# Patient Record
Sex: Male | Born: 1935 | Race: White | Hispanic: No | Marital: Married | Smoking: Former smoker
Health system: Southern US, Community
[De-identification: ages and names within clinical notes are randomized; demographics above are authoritative.]

## PROBLEM LIST (undated history)

## (undated) DIAGNOSIS — R970 Elevated carcinoembryonic antigen [CEA]: Secondary | ICD-10-CM

## (undated) DIAGNOSIS — J45909 Unspecified asthma, uncomplicated: Secondary | ICD-10-CM

## (undated) DIAGNOSIS — N200 Calculus of kidney: Secondary | ICD-10-CM

## (undated) DIAGNOSIS — Z87442 Personal history of urinary calculi: Secondary | ICD-10-CM

## (undated) DIAGNOSIS — M1990 Unspecified osteoarthritis, unspecified site: Secondary | ICD-10-CM

## (undated) DIAGNOSIS — Z86010 Personal history of colonic polyps: Principal | ICD-10-CM

## (undated) DIAGNOSIS — K862 Cyst of pancreas: Secondary | ICD-10-CM

## (undated) DIAGNOSIS — K760 Fatty (change of) liver, not elsewhere classified: Secondary | ICD-10-CM

## (undated) DIAGNOSIS — N400 Enlarged prostate without lower urinary tract symptoms: Secondary | ICD-10-CM

## (undated) DIAGNOSIS — E785 Hyperlipidemia, unspecified: Secondary | ICD-10-CM

## (undated) DIAGNOSIS — N201 Calculus of ureter: Secondary | ICD-10-CM

## (undated) DIAGNOSIS — K219 Gastro-esophageal reflux disease without esophagitis: Secondary | ICD-10-CM

## (undated) DIAGNOSIS — E119 Type 2 diabetes mellitus without complications: Secondary | ICD-10-CM

## (undated) DIAGNOSIS — N529 Male erectile dysfunction, unspecified: Secondary | ICD-10-CM

## (undated) DIAGNOSIS — R972 Elevated prostate specific antigen [PSA]: Secondary | ICD-10-CM

## (undated) DIAGNOSIS — J189 Pneumonia, unspecified organism: Secondary | ICD-10-CM

## (undated) DIAGNOSIS — D400 Neoplasm of uncertain behavior of prostate: Secondary | ICD-10-CM

## (undated) HISTORY — DX: Type 2 diabetes mellitus without complications: E11.9

## (undated) HISTORY — DX: Personal history of colonic polyps: Z86.010

## (undated) HISTORY — PX: CHOLECYSTECTOMY: SHX55

## (undated) HISTORY — DX: Gastro-esophageal reflux disease without esophagitis: K21.9

## (undated) HISTORY — DX: Elevated carcinoembryonic antigen (CEA): R97.0

## (undated) HISTORY — DX: Neoplasm of uncertain behavior of prostate: D40.0

## (undated) HISTORY — DX: Male erectile dysfunction, unspecified: N52.9

## (undated) HISTORY — DX: Benign prostatic hyperplasia without lower urinary tract symptoms: N40.0

## (undated) HISTORY — DX: Elevated prostate specific antigen (PSA): R97.20

## (undated) HISTORY — DX: Calculus of kidney: N20.0

## (undated) HISTORY — DX: Hyperlipidemia, unspecified: E78.5

## (undated) HISTORY — DX: Personal history of urinary calculi: Z87.442

## (undated) HISTORY — DX: Fatty (change of) liver, not elsewhere classified: K76.0

---

## 1983-01-02 HISTORY — PX: PERCUTANEOUS NEPHROSTOLITHOTOMY: SHX2207

## 2011-02-02 ENCOUNTER — Other Ambulatory Visit: Payer: Self-pay | Admitting: Urology

## 2011-02-02 DIAGNOSIS — K869 Disease of pancreas, unspecified: Secondary | ICD-10-CM

## 2011-02-07 ENCOUNTER — Other Ambulatory Visit: Payer: Self-pay

## 2011-02-07 ENCOUNTER — Other Ambulatory Visit: Payer: Self-pay | Admitting: Urology

## 2011-02-07 DIAGNOSIS — Z139 Encounter for screening, unspecified: Secondary | ICD-10-CM

## 2011-02-08 ENCOUNTER — Ambulatory Visit
Admission: RE | Admit: 2011-02-08 | Discharge: 2011-02-08 | Disposition: A | Discharge status: Home / Self Care | Payer: Medicare Other | Source: Ambulatory Visit | Attending: Urology | Admitting: Urology

## 2011-02-08 DIAGNOSIS — Z139 Encounter for screening, unspecified: Secondary | ICD-10-CM

## 2011-02-10 ENCOUNTER — Ambulatory Visit
Admission: RE | Admit: 2011-02-10 | Discharge: 2011-02-10 | Disposition: A | Discharge status: Home / Self Care | Payer: Medicare Other | Source: Ambulatory Visit | Attending: Urology | Admitting: Urology

## 2011-02-10 DIAGNOSIS — K869 Disease of pancreas, unspecified: Secondary | ICD-10-CM

## 2011-02-10 MED ORDER — GADOBENATE DIMEGLUMINE 529 MG/ML IV SOLN
19.0000 mL | Freq: Once | INTRAVENOUS | Status: AC | PRN
  Administered 2011-02-10: 19 mL via INTRAVENOUS

## 2011-02-12 ENCOUNTER — Encounter: Payer: Self-pay | Admitting: Gastroenterology

## 2011-02-26 ENCOUNTER — Ambulatory Visit (INDEPENDENT_AMBULATORY_CARE_PROVIDER_SITE_OTHER): Payer: Medicare Other | Admitting: Gastroenterology

## 2011-02-26 ENCOUNTER — Other Ambulatory Visit (INDEPENDENT_AMBULATORY_CARE_PROVIDER_SITE_OTHER): Payer: Medicare Other

## 2011-02-26 ENCOUNTER — Encounter: Payer: Self-pay | Admitting: Gastroenterology

## 2011-02-26 VITALS — BP 134/78 | HR 62 | Ht 68.0 in | Wt 198.6 lb

## 2011-02-26 DIAGNOSIS — K862 Cyst of pancreas: Secondary | ICD-10-CM | POA: Insufficient documentation

## 2011-02-26 DIAGNOSIS — K863 Pseudocyst of pancreas: Secondary | ICD-10-CM

## 2011-02-26 LAB — COMPREHENSIVE METABOLIC PANEL
BUN: 18 mg/dL (ref 6–23)
CO2: 30 mEq/L (ref 19–32)
Calcium: 9.8 mg/dL (ref 8.4–10.5)
Chloride: 106 mEq/L (ref 96–112)
Creatinine, Ser: 1 mg/dL (ref 0.4–1.5)
GFR: 79.2 mL/min (ref >60.00)
Glucose, Bld: 104 mg/dL — ABNORMAL HIGH (ref 70–99)

## 2011-02-26 LAB — CBC WITH DIFFERENTIAL/PLATELET
Basophils Relative: 0.6 % (ref 0.0–3.0)
Hemoglobin: 14.8 g/dL (ref 13.0–17.0)
Lymphocytes Relative: 26.6 % (ref 12.0–46.0)
Monocytes Relative: 5.2 % (ref 3.0–12.0)
Neutro Abs: 5.1 10*3/uL (ref 1.4–7.7)
RBC: 5.06 Mil/uL (ref 4.22–5.81)
WBC: 7.8 10*3/uL (ref 4.5–10.5)

## 2011-02-26 NOTE — Progress Notes (Signed)
HPI: This is a   very pleasant 78 year old man whom I am meeting for the first time today.  Was having gross hematuria.  THis occurs periodically over many years. Had CT scan at [facility] that showed pancreatic cyst, this was followed up by MRI.  I have evaluated the MRI report and images. I do not have access to the [facility] CT scan images or report however.   He has never been told he had pancreatic problems.  Never a big etoh drinker. WEight stable.  Never jaundice.  No FH of pancreatic disease.  The pains were right sided, flank radiating to right groin.    IMPRESSION from a MRI earlier this month with IV and oral contrast :  2.6 x 1.7 cm cystic lesion in the region of the pancreatic  head/uncinate process. Possible ductal communication without main  pancreatic duct dilatation, suggesting a side-branch IPMN.  Adjacent smaller cystic lesions in the uncinate process. No  enhancement. Given the size, endoscopic ultrasound with tissue  sampling should be considered. Otherwise, follow-up MRI  with/without contrast is suggested in this 6 months.  Bilateral renal cysts. No suspicious/enhancing renal lesions.  Cholelithiasis, without associated inflammatory changes.  Hepatic cysts. Hepatic steatosis.   Review of systems: Pertinent positive and negative review of systems were noted in the above HPI section. Complete review of systems was performed and was otherwise normal.    Past Medical History  Diagnosis Date  . Personal history of urinary calculi   . Impotence of organic origin   . Neoplasm of uncertain behavior of prostate   . Elevated prostate specific antigen (PSA)   . Asthma   . Kidney stone     Past Surgical History  Procedure Date  . Cystoscopy     Current Outpatient Prescriptions  Medication Sig Dispense Refill  . hydrOXYzine (ATARAX/VISTARIL) 25 MG tablet Take 25 mg by mouth as needed.      . desoximetasone (TOPICORT) 0.25 % cream Apply 1 application topically 2  (two) times daily.      . finasteride (PROSCAR) 5 MG tablet Take 5 mg by mouth daily.      . Multiple Vitamins-Minerals (MULTIVITAMIN WITH MINERALS) tablet Take 1 tablet by mouth daily.      Marland Kitchen oxyCODONE-acetaminophen (PERCOCET) 5-325 MG per tablet Take 1 tablet by mouth every 4 (four) hours as needed.        Allergies as of 02/26/2011  . (No Known Allergies)    Family History  Problem Relation Age of Onset  . Diabetes Mother   . Diabetes Sister   . Cancer Brother     unknown type  . Stroke Father     History   Social History  . Marital Status: Married    Spouse Name: N/A    Number of Children: 1  . Years of Education: N/A   Occupational History  . auctioneer    Social History Main Topics  . Smoking status: Former Games developer  . Smokeless tobacco: Never Used  . Alcohol Use: No  . Drug Use: No  . Sexually Active: Not on file   Other Topics Concern  . Not on file   Social History Narrative  . No narrative on file       Physical Exam: BP 134/78  Pulse 62  Ht 5\' 8"  (1.727 m)  Wt 198 lb 9.6 oz (90.084 kg)  BMI 30.20 kg/m2 Constitutional: generally well-appearing Psychiatric: alert and oriented x3 Eyes: extraocular movements intact Mouth: oral pharynx moist, no  lesions Neck: supple no lymphadenopathy Cardiovascular: heart regular rate and rhythm Lungs: clear to auscultation bilaterally Abdomen: soft, nontender, nondistended, no obvious ascites, no peritoneal signs, normal bowel sounds Extremities: no lower extremity edema bilaterally Skin: no lesions on visible extremities    Assessment and plan: 78 y.o. male with  incidental, 2.6 centimeter pancreatic cyst    I explained to them that I think it is very unlikely that he has pancreatic adenocarcinoma. This cyst may be precancerous however. It may be a type of cyst that has no potential to transform into cancer. Endoscopic ultrasound with fine-needle aspiration is the best next step. We will proceed with that  in the next few weeks. He has a right ureteral stone and I think the treatment of that should proceed as well.  I don't think that he needs to wait for his urology care to be done until after the pancreatic cyst is more thoroughly evaluated due to the low likelihood of underlying pancreatic cancer given the images and his history.

## 2011-02-26 NOTE — Patient Instructions (Signed)
You will be set up for an upper endoscopic ultrasound to evaluate pancreatic cyst with PROPOFOL sedation because of previous troubles with sedation. You should continue with urology care as needed (no need to wait for the pancreatic testing given low likelihood of serious pancreatic disease such as current cancer). A copy of this information will be made available to Dr. Margarita Grizzle at [facility]. You will have labs checked today in the basement lab.  Please head down after you check out with the front desk  (CA 19-9, cbc, cmet).

## 2011-02-27 ENCOUNTER — Other Ambulatory Visit: Payer: Self-pay | Admitting: Urology

## 2011-03-02 HISTORY — PX: KIDNEY STONE SURGERY: SHX686

## 2011-03-09 ENCOUNTER — Encounter (HOSPITAL_BASED_OUTPATIENT_CLINIC_OR_DEPARTMENT_OTHER): Payer: Self-pay | Admitting: *Deleted

## 2011-03-09 NOTE — Progress Notes (Signed)
NPO AFTER MN. ARRIVES AT 0945. NEEDS HG AND EKG.

## 2011-03-14 ENCOUNTER — Other Ambulatory Visit: Payer: Self-pay

## 2011-03-14 ENCOUNTER — Encounter (HOSPITAL_BASED_OUTPATIENT_CLINIC_OR_DEPARTMENT_OTHER): Payer: Self-pay | Admitting: *Deleted

## 2011-03-14 ENCOUNTER — Encounter (HOSPITAL_COMMUNITY)
Admission: RE | Disposition: A | Discharge status: Home / Self Care | Payer: Self-pay | Source: Ambulatory Visit | Attending: Urology

## 2011-03-14 ENCOUNTER — Ambulatory Visit (HOSPITAL_BASED_OUTPATIENT_CLINIC_OR_DEPARTMENT_OTHER): Payer: Medicare Other | Admitting: Anesthesiology

## 2011-03-14 ENCOUNTER — Ambulatory Visit (HOSPITAL_BASED_OUTPATIENT_CLINIC_OR_DEPARTMENT_OTHER)
Admission: RE | Admit: 2011-03-14 | Discharge: 2011-03-15 | Disposition: A | Discharge status: Home / Self Care | Payer: Medicare Other | Source: Ambulatory Visit | Attending: Urology | Admitting: Urology

## 2011-03-14 ENCOUNTER — Encounter (HOSPITAL_BASED_OUTPATIENT_CLINIC_OR_DEPARTMENT_OTHER): Payer: Self-pay | Admitting: Anesthesiology

## 2011-03-14 ENCOUNTER — Inpatient Hospital Stay: Admission: AD | Admit: 2011-03-14 | Payer: Medicare Other | Source: Ambulatory Visit | Admitting: Urology

## 2011-03-14 DIAGNOSIS — Z79899 Other long term (current) drug therapy: Secondary | ICD-10-CM | POA: Insufficient documentation

## 2011-03-14 DIAGNOSIS — R972 Elevated prostate specific antigen [PSA]: Secondary | ICD-10-CM | POA: Insufficient documentation

## 2011-03-14 DIAGNOSIS — N419 Inflammatory disease of prostate, unspecified: Secondary | ICD-10-CM | POA: Insufficient documentation

## 2011-03-14 DIAGNOSIS — R310 Gross hematuria: Secondary | ICD-10-CM | POA: Insufficient documentation

## 2011-03-14 DIAGNOSIS — N201 Calculus of ureter: Secondary | ICD-10-CM | POA: Insufficient documentation

## 2011-03-14 DIAGNOSIS — N400 Enlarged prostate without lower urinary tract symptoms: Secondary | ICD-10-CM | POA: Insufficient documentation

## 2011-03-14 HISTORY — DX: Calculus of ureter: N20.1

## 2011-03-14 HISTORY — DX: Unspecified asthma, uncomplicated: J45.909

## 2011-03-14 HISTORY — DX: Cyst of pancreas: K86.2

## 2011-03-14 HISTORY — PX: CYSTOSCOPY WITH URETEROSCOPY: SHX5123

## 2011-03-14 SURGERY — CYSTOSCOPY WITH URETEROSCOPY
Anesthesia: General | Site: Ureter | Laterality: Right | Wound class: Clean Contaminated

## 2011-03-14 MED ORDER — BELLADONNA ALKALOIDS-OPIUM 16.2-60 MG RE SUPP: 1.0000 | Freq: Four times a day (QID) | RECTAL | Status: DC | PRN

## 2011-03-14 MED ORDER — CEFAZOLIN SODIUM 1-5 GM-% IV SOLN
1.0000 g | INTRAVENOUS | Status: AC
  Administered 2011-03-14: 2 g via INTRAVENOUS

## 2011-03-14 MED ORDER — LIDOCAINE HCL (CARDIAC) 20 MG/ML IV SOLN
INTRAVENOUS | Status: DC | PRN
  Administered 2011-03-14: 60 mg via INTRAVENOUS

## 2011-03-14 MED ORDER — ROCURONIUM BROMIDE 100 MG/10ML IV SOLN
INTRAVENOUS | Status: DC | PRN
  Administered 2011-03-14: 40 mg via INTRAVENOUS
  Administered 2011-03-14: 10 mg via INTRAVENOUS

## 2011-03-14 MED ORDER — FENTANYL CITRATE 0.05 MG/ML IJ SOLN: 25.0000 ug | INTRAMUSCULAR | Status: DC | PRN

## 2011-03-14 MED ORDER — GLYCOPYRROLATE 0.2 MG/ML IJ SOLN
INTRAMUSCULAR | Status: DC | PRN
  Administered 2011-03-14: 0.6 mg via INTRAVENOUS

## 2011-03-14 MED ORDER — NEOSTIGMINE METHYLSULFATE 1 MG/ML IJ SOLN
INTRAMUSCULAR | Status: DC | PRN
  Administered 2011-03-14: 4 mg via INTRAVENOUS

## 2011-03-14 MED ORDER — PROMETHAZINE HCL 25 MG/ML IJ SOLN: 6.2500 mg | INTRAMUSCULAR | Status: DC | PRN

## 2011-03-14 MED ORDER — PROPOFOL 10 MG/ML IV EMUL
INTRAVENOUS | Status: DC | PRN
  Administered 2011-03-14: 50 mg via INTRAVENOUS
  Administered 2011-03-14: 150 mg via INTRAVENOUS

## 2011-03-14 MED ORDER — LIDOCAINE HCL 2 % EX GEL
CUTANEOUS | Status: DC | PRN
  Administered 2011-03-14: 1

## 2011-03-14 MED ORDER — CEFAZOLIN SODIUM-DEXTROSE 2-3 GM-% IV SOLR
2.0000 g | Freq: Three times a day (TID) | INTRAVENOUS | Status: AC
  Administered 2011-03-14 – 2011-03-15 (×2): 2 g via INTRAVENOUS
  Filled 2011-03-14 (×2): qty 50

## 2011-03-14 MED ORDER — LACTATED RINGERS IV SOLN
INTRAVENOUS | Status: DC
  Administered 2011-03-14 (×3): via INTRAVENOUS

## 2011-03-14 MED ORDER — ACETAMINOPHEN 325 MG PO TABS: 650.0000 mg | ORAL_TABLET | ORAL | Status: DC | PRN

## 2011-03-14 MED ORDER — ONDANSETRON HCL 4 MG/2ML IJ SOLN
4.0000 mg | INTRAMUSCULAR | Status: DC | PRN
  Administered 2011-03-15: 4 mg via INTRAVENOUS
  Filled 2011-03-14: qty 2

## 2011-03-14 MED ORDER — OXYCODONE HCL 5 MG PO TABS: 5.0000 mg | ORAL_TABLET | ORAL | Status: DC | PRN

## 2011-03-14 MED ORDER — SODIUM CHLORIDE 0.9 % IR SOLN
Status: DC | PRN
  Administered 2011-03-14: 6000 mL

## 2011-03-14 MED ORDER — FINASTERIDE 5 MG PO TABS
5.0000 mg | ORAL_TABLET | Freq: Every day | ORAL | Status: DC
  Administered 2011-03-14 – 2011-03-15 (×2): 5 mg via ORAL
  Filled 2011-03-14 (×2): qty 1

## 2011-03-14 MED ORDER — ACETAMINOPHEN 10 MG/ML IV SOLN
INTRAVENOUS | Status: DC | PRN
  Administered 2011-03-14: 1000 mg via INTRAVENOUS

## 2011-03-14 MED ORDER — ONDANSETRON HCL 4 MG/2ML IJ SOLN
INTRAMUSCULAR | Status: DC | PRN
  Administered 2011-03-14: 4 mg via INTRAVENOUS

## 2011-03-14 MED ORDER — CALCIUM CARBONATE ANTACID 500 MG PO CHEW
400.0000 mg | CHEWABLE_TABLET | Freq: Four times a day (QID) | ORAL | Status: DC | PRN
  Administered 2011-03-14: 400 mg via ORAL
  Filled 2011-03-14 (×2): qty 2

## 2011-03-14 MED ORDER — ACETAMINOPHEN 10 MG/ML IV SOLN
1000.0000 mg | Freq: Four times a day (QID) | INTRAVENOUS | Status: DC
  Administered 2011-03-14 – 2011-03-15 (×3): 1000 mg via INTRAVENOUS
  Filled 2011-03-14 (×4): qty 100

## 2011-03-14 MED ORDER — IOHEXOL 350 MG/ML SOLN
INTRAVENOUS | Status: DC | PRN
  Administered 2011-03-14: 10 mL via INTRAVENOUS

## 2011-03-14 MED ORDER — SODIUM CHLORIDE 0.9 % IV SOLN
INTRAVENOUS | Status: DC
  Administered 2011-03-14: 17:00:00 via INTRAVENOUS

## 2011-03-14 MED ORDER — FENTANYL CITRATE 0.05 MG/ML IJ SOLN
INTRAMUSCULAR | Status: DC | PRN
  Administered 2011-03-14: 100 ug via INTRAVENOUS
  Administered 2011-03-14 (×2): 50 ug via INTRAVENOUS

## 2011-03-14 MED ORDER — BACITRACIN-NEOMYCIN-POLYMYXIN 400-5-5000 EX OINT: 1.0000 "application " | TOPICAL_OINTMENT | Freq: Three times a day (TID) | CUTANEOUS | Status: DC | PRN

## 2011-03-14 MED ORDER — SENNOSIDES-DOCUSATE SODIUM 8.6-50 MG PO TABS
1.0000 | ORAL_TABLET | Freq: Two times a day (BID) | ORAL | Status: DC
  Administered 2011-03-14 – 2011-03-15 (×2): 1 via ORAL
  Filled 2011-03-14 (×3): qty 1

## 2011-03-14 MED ORDER — DEXAMETHASONE SODIUM PHOSPHATE 4 MG/ML IJ SOLN
INTRAMUSCULAR | Status: DC | PRN
  Administered 2011-03-14: 10 mg via INTRAVENOUS

## 2011-03-14 MED ORDER — MORPHINE SULFATE 2 MG/ML IJ SOLN
2.0000 mg | INTRAMUSCULAR | Status: DC | PRN
  Administered 2011-03-15: 2 mg via INTRAVENOUS
  Filled 2011-03-14: qty 1

## 2011-03-14 SURGICAL SUPPLY — 48 items
ADAPTER CATH URET PLST 4-6FR (CATHETERS) ×2 IMPLANT
ADPR CATH URET STRL DISP 4-6FR (CATHETERS) ×1
BAG DRAIN URO-CYSTO SKYTR STRL (DRAIN) ×2 IMPLANT
BAG DRN UROCATH (DRAIN) ×1
BAG URINE LEG 500ML (DRAIN) ×4 IMPLANT
BASKET LASER NITINOL 1.9FR (BASKET) ×2 IMPLANT
BASKET STNLS GEMINI 4WIRE 3FR (BASKET) IMPLANT
BASKET ZERO TIP NITINOL 2.4FR (BASKET) IMPLANT
BRUSH URET BIOPSY 3F (UROLOGICAL SUPPLIES) IMPLANT
BSKT STON RTRVL 120 1.9FR (BASKET) ×1
BSKT STON RTRVL GEM 120X11 3FR (BASKET)
BSKT STON RTRVL ZERO TP 2.4FR (BASKET)
CANISTER SUCT LVC 12 LTR MEDI- (MISCELLANEOUS) ×2 IMPLANT
CATH CLEAR GEL 3F BACKSTOP (CATHETERS) ×2 IMPLANT
CATH HEMA 3WAY 30CC 24FR COUDE (CATHETERS) ×2 IMPLANT
CATH INTERMIT  6FR 70CM (CATHETERS) IMPLANT
CATH URET 5FR 28IN CONE TIP (BALLOONS)
CATH URET 5FR 28IN OPEN ENDED (CATHETERS) ×2 IMPLANT
CATH URET 5FR 70CM CONE TIP (BALLOONS) IMPLANT
CATH URET DUAL LUMEN 6-10FR 50 (CATHETERS) ×2 IMPLANT
CLOTH BEACON ORANGE TIMEOUT ST (SAFETY) ×2 IMPLANT
DRAPE CAMERA CLOSED 9X96 (DRAPES) ×2 IMPLANT
ELECT REM PT RETURN 9FT ADLT (ELECTROSURGICAL)
ELECTRODE REM PT RTRN 9FT ADLT (ELECTROSURGICAL) IMPLANT
GLOVE BIO SURGEON STRL SZ 6.5 (GLOVE) ×2 IMPLANT
GLOVE ECLIPSE 6.0 STRL STRAW (GLOVE) ×2 IMPLANT
GLOVE ECLIPSE 7.0 STRL STRAW (GLOVE) ×2 IMPLANT
GLOVE INDICATOR 7.5 STRL GRN (GLOVE) ×2 IMPLANT
GOWN PREVENTION PLUS LG XLONG (DISPOSABLE) ×2 IMPLANT
GUIDEWIRE 0.038 PTFE COATED (WIRE) IMPLANT
GUIDEWIRE ANG ZIPWIRE 038X150 (WIRE) IMPLANT
GUIDEWIRE STR DUAL SENSOR (WIRE) ×4 IMPLANT
IV NS IRRIG 3000ML ARTHROMATIC (IV SOLUTION) ×6 IMPLANT
KIT BALLIN UROMAX 15FX10 (LABEL) IMPLANT
KIT BALLN UROMAX 15FX4 (MISCELLANEOUS) IMPLANT
KIT BALLN UROMAX 26 75X4 (MISCELLANEOUS)
LASER FIBER DISP (UROLOGICAL SUPPLIES) ×2 IMPLANT
PACK CYSTOSCOPY (CUSTOM PROCEDURE TRAY) ×2 IMPLANT
PLUG CATH AND CAP STER (CATHETERS) ×2 IMPLANT
SET HIGH PRES BAL DIL (LABEL)
SHEATH ACCESS URETERAL 38CM (SHEATH) ×2 IMPLANT
SHEATH URET ACCESS 12FR/35CM (UROLOGICAL SUPPLIES) IMPLANT
SHEATH URET ACCESS 12FR/55CM (UROLOGICAL SUPPLIES) IMPLANT
STENT CONTOUR 6FRX24X.038 (STENTS) IMPLANT
STENT URET 6FRX24 CONTOUR (STENTS) ×2 IMPLANT
SYR 30ML LL (SYRINGE) ×2 IMPLANT
SYRINGE IRR TOOMEY STRL 70CC (SYRINGE) ×2 IMPLANT
WATER STERILE IRR 500ML POUR (IV SOLUTION) ×2 IMPLANT

## 2011-03-14 NOTE — Discharge Instructions (Signed)
DISCHARGE INSTRUCTIONS FOR KIDNEY STONES OR URETERAL STENT  MEDICATIONS:   1. DO NOT RESUME YOUR ASPIRIN, or any other medicines like ibuprofen, motrin, excedrin, advil, aleve, vitamin E, fish oil as these can all cause bleeding x 7 days.  2. Resume all your other meds from home - except do not take any other pain meds that you may have at home.  ACTIVITY 1. No strenuous activity x 1week 2. No driving while on narcotic pain medications 3. Drink plenty of water 4. Continue to walk at home - you can still get blood clots when you are at home, so keep active, but don't over do it. 5. May return to work in 3 days.  BATHING 1. You can shower. Do not take baths or submerge the catheter underwater.  CATHETER (If you are discharged with a catheter.) 1. Keep your catheter secured to your leg at all times with tape. 2. You may experience leakage of urine around your catheter- as long as the  catheter continues to drain, this is normal.  If your catheter stops draining  return to the ER, but do not let anyone other than a urologist replace your  catheter. 3. You may also have blood in your urine, even after it has been clear for  several days; you may even pass some small blood clots or other material.  This  is normal as well.  If this happens, sit down and drink plenty of water to help  make urine to flush out your bladder.  If the blood in your urine becomes worse  after doing this, contact our office or return to the ER. 4. You may use the leg bag (small bag) during the day, but use the large bag at  night.  SIGNS/SYMPTOMS TO CALL: 1. Please call us if you have a fever greater than 101.5, uncontrolled  nausea/vomiting, uncontrolled pain, dizziness, unable to urinate, chest pain, shortness of breath, leg swelling, leg pain, redness around wound, drainage from wound, or any other concerns or questions.  You can reach [place] at [phone].  FOLLOW-UP 1. You have an appointment 03/19/11 at  8:00 am for catheter removal. 2. You will be scheduled for follow up in 2 weeks for stent removal.

## 2011-03-14 NOTE — Brief Op Note (Signed)
03/14/2011  12:19 PM  PATIENT:  Francisco [name]  78 y.o. male  PRE-OPERATIVE DIAGNOSIS:  Right Distal Ureter Stone  POST-OPERATIVE DIAGNOSIS:  Right Distal Ureter Stone  PROCEDURE:  Procedure(s) (LRB): CYSTOSCOPY Right URETEROSCOPY  Laser lithotripsy Right ureter stent placement Fluoroscopy  SURGEON:  Surgeon(s) and Role:    * Milford Cage, MD - Primary  PHYSICIAN ASSISTANT:   ASSISTANTS: none   ANESTHESIA:   general  EBL:  Total I/O In: 1000 [I.V.:1000] Out: -   BLOOD ADMINISTERED:none  DRAINS: Urinary Catheter (Foley)   LOCAL MEDICATIONS USED:  LIDOCAINE  and Amount: 10 ml urethral jelly  SPECIMEN:  Source of Specimen:  right ureter  DISPOSITION OF SPECIMEN:  Sent for stone analysis at [facility] lab  COUNTS:  YES  TOURNIQUET:  * No tourniquets in log *  DICTATION: .Other Dictation: Dictation Number [id number]  PLAN OF CARE: Admit for overnight observation  PATIENT DISPOSITION:  PACU - hemodynamically stable.   Delay start of Pharmacological VTE agent (>24hrs) [name] to surgical blood loss or risk of bleeding: yes

## 2011-03-14 NOTE — Progress Notes (Signed)
Reort given to [employer] , room [phone]

## 2011-03-14 NOTE — Transfer of Care (Signed)
Immediate Anesthesia Transfer of Care Note  Patient: Francisco [name]  Procedure(s) Performed: Procedure(s) (LRB): CYSTOSCOPY WITH URETEROSCOPY (Right) HOLMIUM LASER APPLICATION (Right)  Patient Location: PACU  Anesthesia Type: General  Level of Consciousness: awake, alert  and oriented  Airway & Oxygen Therapy: Patient Spontanous Breathing and Patient connected to face mask oxygen  Post-op Assessment: Report given to PACU RN and Post -op Vital signs reviewed and stable  Post vital signs: Reviewed and stable  Complications: No apparent anesthesia complications

## 2011-03-14 NOTE — Progress Notes (Signed)
Dr Margarita Grizzle in to see pt & irrigants foley cath w 218m sterile NS w blood clots noted.  Request pt to be admit.

## 2011-03-14 NOTE — Anesthesia Procedure Notes (Signed)
Procedure Name: Intubation Date/Time: 03/14/2011 10:37 AM Performed by: Norva Pavlov Pre-anesthesia Checklist: Patient identified, Emergency Drugs available, Suction available and Patient being monitored Patient Re-evaluated:Patient Re-evaluated prior to inductionOxygen Delivery Method: Circle System Utilized Preoxygenation: Pre-oxygenation with 100% oxygen Intubation Type: IV induction Ventilation: Mask ventilation without difficulty Laryngoscope Size: Mac and 3 Grade View: Grade I Tube type: Oral Tube size: 8.0 mm Number of attempts: 1 Airway Equipment and Method: stylet and oral airway Placement Confirmation: ETT inserted through vocal cords under direct vision,  positive ETCO2 and breath sounds checked- equal and bilateral Secured at: 22 cm Tube secured with: Tape Dental Injury: Teeth and Oropharynx as per pre-operative assessment

## 2011-03-14 NOTE — Anesthesia Preprocedure Evaluation (Signed)
Anesthesia Evaluation  Patient identified by MRN, date of birth, ID band Patient awake    Reviewed: Allergy & Precautions, H&P , NPO status , Patient's Chart, lab work & pertinent test results  Airway Mallampati: II TM Distance: <3 FB Neck ROM: Full    Dental No notable dental hx.    Pulmonary neg pulmonary ROS,  breath sounds clear to auscultation  Pulmonary exam normal       Cardiovascular negative cardio ROS  Rhythm:Regular Rate:Normal     Neuro/Psych negative neurological ROS  negative psych ROS   GI/Hepatic negative GI ROS, Neg liver ROS,   Endo/Other  negative endocrine ROS  Renal/GU negative Renal ROS  negative genitourinary   Musculoskeletal negative musculoskeletal ROS (+)   Abdominal   Peds negative pediatric ROS (+)  Hematology negative hematology ROS (+)   Anesthesia Other Findings   Reproductive/Obstetrics negative OB ROS                           Anesthesia Physical Anesthesia Plan  ASA: I  Anesthesia Plan: General   Post-op Pain Management:    Induction: Intravenous  Airway Management Planned: LMA  Additional Equipment:   Intra-op Plan:   Post-operative Plan:   Informed Consent: I have reviewed the patients History and Physical, chart, labs and discussed the procedure including the risks, benefits and alternatives for the proposed anesthesia with the patient or authorized representative who has indicated his/her understanding and acceptance.   Dental advisory given  Plan Discussed with: CRNA  Anesthesia Plan Comments:         Anesthesia Quick Evaluation  

## 2011-03-14 NOTE — Anesthesia Postprocedure Evaluation (Signed)
  Anesthesia Post-op Note  Patient: Francisco [name]  Procedure(s) Performed: Procedure(s) (LRB): CYSTOSCOPY WITH URETEROSCOPY (Right) HOLMIUM LASER APPLICATION (Right)  Patient Location: PACU  Anesthesia Type: General  Level of Consciousness: awake and alert   Airway and Oxygen Therapy: Patient Spontanous Breathing  Post-op Pain: mild  Post-op Assessment: Post-op Vital signs reviewed, Patient's Cardiovascular Status Stable, Respiratory Function Stable, Patent Airway and No signs of Nausea or vomiting  Post-op Vital Signs: stable  Complications: No apparent anesthesia complications

## 2011-03-14 NOTE — Progress Notes (Signed)
GU Post-op check  Patient doing well.  Pain controlled.   Filed Vitals:   03/14/11 1525  BP: 128/78  Pulse: 61  Temp: 97.3 F (36.3 C)  Resp: 20  Gen: NAD Abd: soft GU: Foley draining pink fluid on CBI  A/P: Right ureteroscopy with laser lithotripsy and right ureter stent placement today. -Admitted for gross hematuria from the prostate following the surgery. -Continue CBI.  Will turn off in the morning and assess further CBI need at that time.

## 2011-03-14 NOTE — H&P (Signed)
Urology History and Physical Exam  CC: Right ureter stone  HPI:    This patient presents today for cystoscopy, right ureteroscopy, laser lithotripsy, and right ureter stent placement. He has a right distal 5.5 x 7.5 mm stone.  This has caused pain in the past.  The stone was visible on CT 02/01/11 and KUB 02/12/11. We have discussed the risks, benefits, alternatives, and likelihood of achieving goals. Urine culture from 02/12/11 was negative for growth.  PMH: Past Medical History  Diagnosis Date  . Personal history of urinary calculi   . Impotence of organic origin   . Neoplasm of uncertain behavior of prostate   . Elevated prostate specific antigen (PSA)   . Right ureteral stone   . Asthma with allergic rhinitis   . Pancreatic cyst FOLLOWED BY DR JACOBS    PSH: Past Surgical History  Procedure Date  . Percutaneous nephrostolithotomy 1985    Allergies: No Known Allergies  Medications: No prescriptions prior to admission     Social History: History   Social History  . Marital Status: Married    Spouse Name: N/A    Number of Children: 1  . Years of Education: N/A   Occupational History  . auctioneer    Social History Main Topics  . Smoking status: Never Smoker   . Smokeless tobacco: Never Used  . Alcohol Use: No  . Drug Use: No  . Sexually Active: Not on file   Other Topics Concern  . Not on file   Social History Narrative  . No narrative on file    Family History: Family History  Problem Relation Age of Onset  . Diabetes Mother   . Diabetes Sister   . Cancer Brother     unknown type  . Stroke Father     Review of Systems: Positive: Right flank pain Negative: Chest pain, SOB, fever.  A further 10 point review of systems was negative except what is listed in the HPI.  Physical Exam:  General: No acute distress.  Awake. Head:  Normocephalic.  Atraumatic. ENT:  EOMI.  Mucous membranes moist Neck:  Supple.  No lymphadenopathy. CV:  S1 present. S2  present. Regular rate. Pulmonary: Equal effort bilaterally.  Clear to auscultation bilaterally. Abdomen: Soft.  Non- tender to palpation. Skin:  Normal turgor.  No visible rash. Extremity: No gross deformity of bilateral upper extremities.  No gross deformity of    bilateral lower extremities. Neurologic: Alert. Appropriate mood.  Studies:  No results found for this basename: HGB:2,WBC:2,PLT:2 in the last 72 hours  No results found for this basename: NA:2,K:2,CL:2,CO2:2,BUN:2,CREATININE:2,CALCIUM:2,MAGNESIUM:2,GFRNONAA:2,GFRAA:2 in the last 72 hours   No results found for this basename: PT:2,INR:2,APTT:2 in the last 72 hours   No components found with this basename: ABG:2    Assessment:  Right distal ureter stone  Plan: -To OR for cystoscopy, right ureteroscopy, laser lithotripsy, possible right ureter stent placement.

## 2011-03-14 NOTE — Progress Notes (Signed)
Robin in adm/bed placement notified of need for pt admit.

## 2011-03-15 ENCOUNTER — Encounter (HOSPITAL_BASED_OUTPATIENT_CLINIC_OR_DEPARTMENT_OTHER): Payer: Self-pay | Admitting: Urology

## 2011-03-15 MED ORDER — HYOSCYAMINE SULFATE 0.125 MG PO TABS: 0.1250 mg | ORAL_TABLET | ORAL | Status: AC | PRN

## 2011-03-15 NOTE — Progress Notes (Signed)
Patient discharged home with wife, alert and oriented, discharge instructions given patient and wife verbalize understanding, 3/way foley cath. Clamped and instruction given on date and removal, draining pink colored urine,patient in stable condition at this time

## 2011-03-15 NOTE — Discharge Summary (Signed)
Physician Discharge Summary  Patient ID: Francisco [name] MRN: [id number] DOB/AGE: [birth year] 78 y.o.  Admit date: 03/14/2011 Discharge date: 03/15/2011  Admission Diagnoses: Gross hematuria  Discharge Diagnoses: Gross hematuria   Discharged Condition: good  Hospital Course:  Patient admitted following an uncomplicated right ureteroscopy for stone removal. He had a ureteral stent placed at this time. Due to his large prostate and inflammation of his prostate he had bleeding from his prosthetic area. A catheter was placed in the operating room and he was monitored in the recovery unit. Because of the need for continuous bladder irrigation he was admitted overnight for observation. His continuous bladder irrigation was turned off in the morning and his urine remained dark pain to a ruby red color. He was monitored for over 6 hours with his urine and his condition. He had no problems with obstruction of his catheter.  The patient felt he could be discharged home and I agreed. Prior to discharge he was taught how to flush his catheter and provided with flushing supplies by his nurse.  Consults: None  Significant Diagnostic Studies: None  Treatments: surgery: Right ureteroscopy and Continuous Bladder Irrigation.  Discharge Exam: Blood pressure 136/76, pulse 75, temperature 97.7 F (36.5 C), temperature source Oral, resp. rate 20, height 5\' 8"  (1.727 m), weight 94.4 kg (208 lb 1.8 oz), SpO2 98.00%. See PE from progress note on date of discharge.  Disposition: Final discharge disposition not confirmed  Discharge Orders    Future Orders Please Complete By Expires   Discharge patient        Medication List  As of 03/15/2011 12:36 PM   STOP taking these medications         multivitamin with minerals tablet         TAKE these medications         desoximetasone 0.25 % cream   Commonly known as: TOPICORT   Apply 1 application topically as needed.      finasteride 5 MG tablet   Commonly known as: PROSCAR   Take 5 mg by mouth daily.      hydrOXYzine 25 MG tablet   Commonly known as: ATARAX/VISTARIL   Take 25 mg by mouth as needed.      hyoscyamine 0.125 MG tablet   Commonly known as: LEVSIN, ANASPAZ   Take 1 tablet (0.125 mg total) by mouth every 4 (four) hours as needed for cramping (bladder spasms).      oxyCODONE-acetaminophen 5-325 MG per tablet   Commonly known as: PERCOCET   Take 1 tablet by mouth every 4 (four) hours as needed.           Follow-up Information    Follow up with Saunders Revel, PA on 03/19/2011. (8:00 am)    Contact information:   [address] [facility] [place] [address] [phone]          Signed: Milford Cage 03/15/2011, 12:36 PM

## 2011-03-15 NOTE — Op Note (Signed)
NAME:  EJ, PINSON NO.:  [id number]  MEDICAL RECORD NO.:  [id number]  LOCATION:  1507                         FACILITY:  [facility]  PHYSICIAN:  Natalia Leatherwood, MD    DATE OF BIRTH:  06-16-1933  DATE OF PROCEDURE:  03/14/2011 DATE OF DISCHARGE:                              OPERATIVE REPORT   SURGEON:  Natalia Leatherwood, MD.  PREOPERATIVE DIAGNOSIS:  Right distal ureteral stone.  POSTOPERATIVE DIAGNOSIS:  Right distal ureteral stone.  PROCEDURES PERFORMED: 1. Cystoscopy. 2. Right ureteroscopy. 3. Laser lithotripsy. 4. Basket stone retrieval. 5. Right ureteral stent placement. 6. Fluoroscopy with interpretation.  FINDINGS:  Enlarged prostate with inflammation and bleeding from the prostate.  Embedded stone located in the distal third of the ureter, but very close to the midportion of the ureter.  This was imbedded. Multiple Randall plaques throughout the kidney.  SPECIMEN:  Stones sent for stone analysis at [facility].  ESTIMATED BLOOD LOSS:  10 cc.  COMPLICATIONS:  None.  HISTORY OF PRESENT ILLNESS:  This is a 78 year old gentleman who presented with a right distal ureteral stone that was symptomatic.  He was unable to pass the stone, and after review of treatment options he has elected to have ureteroscopy.  He presents today for that procedure.  PROCEDURE IN DETAIL:  After informed consent was obtained, the patient was taken to the operating room, where he placed in supine position.  IV antibiotics were infused and general anesthesia was induced.  He was then placed in dorsal lithotomy position making sure to pad all pertinent neurovascular pressure points appropriately.  Following this, his genitals were prepped and draped in usual sterile fashion.  A time-out was performed, in which the correct patient, surgical site, and procedure were all identified and agreed upon by the team.  Following this, a rigid cystoscope was advanced through  the urethra and into the bladder.  It was noted that he had a very large prostate with a large median lobe and a large amount of prostatic inflammation that began to bleed with passage of the cystoscope.  Due to the anatomy and the bleeding from the prostate, it was difficult to identify the right ureteral orifice.  Eventually this was identified and cannulated with a sensor tip wire, which was placed up the ureter with ease up into the right renal pelvis with a good curl in the right renal pelvis on fluoroscopy.  Next, an attempt to place a second sensor tip wire was unsuccessful because of the angle and the anatomy and therefore a dual- lumen access sheath was placed over the wire and into the distal ureter and the second sensor tip wire was placed up under fluoroscopy with good curl noted in the renal pelvis on the right side.  Following this, the dual-lumen access sheath was removed and an ureteral access sheath was placed with ease.  This was a 12-14 ureteral access sheath.  This was placed with ease into the distal ureter and obturator, and working wire were removed leaving sensor tip wire in place, which was secured to the drape.  Following this, a digital flexible ureteroscope was placed through the sheath and taken all the way  up into the kidney to ensure there were no proximal ureteral stones.  There were multiple Randall plaques noted within the kidney, but no stones loose or free within the kidney.  It appeared that there were some about to birth; however, these were still stuck to the renal papillae.  Next, the scope was retracted back into the ureter where the stone was embedded just proximal to the pelvic brim.  BackStop gel was placed in the more proximal ureter and then a 200 micron holmium laser filament was placed through the ureteroscope and lithotripsy was carried out at 0.5 joules and 10 Hz.  After this had been broken up adequately, the stone fragments were removed  with an Escape nitinol basket.  All the fragments were removed, and these fragments were sent to the [facility] for stone analysis.  After this was done, the remainder of the ureter was visualized and found to have no lacerations or injury. The access sheath was removed leaving the safety wire placed.  This wire was backloaded onto the cystoscope, and then a 6 x 24 double-J ureteral stent without strings in place was placed up the wire under fluoroscopy and was deployed into the right renal pelvis with good curl noted on the right renal pelvis and a curl noted in the bladder under direct visualization.  Because the patient did have inflamed prostate and bleeding from the prostate it was felt that he needed to have a catheter placed.  A 24-French 3-way catheter was placed with 30 cc in the balloon and irrigated.  Continuous bladder irrigation was started under anesthetic.  Lidocaine jelly of 10 cc was placed in the urethra before placing this catheter.  As mentioned 30 cc of sterile water were placed in the balloon and catheter was put on slight catheter traction. Following this, the B and O suppository was placed into his rectum. Continuous bladder irrigation was turned off in the PACU, and he will be assessed at that time whether not to be admitted for hematuria and bladder irrigation.          ______________________________ Natalia Leatherwood, MD     DW/MEDQ  D:  03/14/2011  T:  03/15/2011  Job:  [id number]

## 2011-03-15 NOTE — Progress Notes (Signed)
Urology Progress Note  Subjective:     No acute urologic events overnight. CBI turned off about one hour before I evaluated the patient.  Urine is dark pink and flowing through catheter.  Patient has no pain.  Objective:  Patient Vitals for the past 24 hrs:  BP Temp Temp src Pulse Resp SpO2 Height Weight  03/15/11 0531 134/70 mmHg 97.4 F (36.3 C) Oral 69  18  98 % - -  03/14/11 2114 120/71 mmHg 97.7 F (36.5 C) Oral 68  18  98 % - -  03/14/11 1525 128/78 mmHg 97.3 F (36.3 C) Oral 61  20  100 % 5\' 8"  (1.727 m) 94.4 kg (208 lb 1.8 oz)  03/14/11 1500 126/76 mmHg - - - 16  97 % - -  03/14/11 1400 141/99 mmHg - - 66  14  96 % - -  03/14/11 1345 136/74 mmHg - - 63  14  95 % - -  03/14/11 1330 171/105 mmHg - - 67  16  97 % - -  03/14/11 1315 142/95 mmHg - - 56  12  100 % - -  03/14/11 1300 128/81 mmHg - - 53  11  100 % - -  03/14/11 1245 150/80 mmHg - - 60  15  100 % - -  03/14/11 1220 156/78 mmHg 97.5 F (36.4 C) - 72  12  100 % - -  03/14/11 0951 134/86 mmHg 97 F (36.1 C) Oral 65  18  98 % - -    Physical Exam: General:  No acute distress, awake Cardiovascular:    [x]   S1/S2 present, RRR  []   Irregularly irregular Chest:  CTA-B Abdomen:               []  Soft, appropriately TTP  [x]  Soft, NTTP  []  Soft, appropriately TTP, incision(s) clean/dry/intact  Genitourinary: Foley in place Foley:  Draining dark pink urine    I/O last 3 completed shifts: In: 2680 [P.O.:480; I.V.:1800; IV Piggyback:400] Out: [address] [Urine:13050]  Recent Labs  [facility] 03/14/11 0945   HGB 15.6   WBC --   PLT --    No results found for this basename: NA:2,K:2,CL:2,CO2:2,BUN:2,CREATININE:2,CALCIUM:2,MAGNESIUM:2,GFRNONAA:2,GFRAA:2 in the last 72 hours   No results found for this basename: PT:2,INR:2,APTT:2 in the last 72 hours   No components found with this basename: ABG:2     Assessment: Gross hematuria Plan: -CBI turned off this morning and hand irrigation returned one small  clot. -Continue with CBI turned off this morning and I will re-evaluate at noon for possible d/c home. -Saline lock IV   Natalia Leatherwood, MD [phone]

## 2011-04-11 ENCOUNTER — Encounter (HOSPITAL_COMMUNITY): Payer: Self-pay

## 2011-04-12 ENCOUNTER — Ambulatory Visit (HOSPITAL_COMMUNITY)
Admission: RE | Admit: 2011-04-12 | Discharge: 2011-04-12 | Disposition: A | Discharge status: Home / Self Care | Payer: Medicare Other | Source: Ambulatory Visit | Attending: Gastroenterology | Admitting: Gastroenterology

## 2011-04-12 ENCOUNTER — Encounter (HOSPITAL_COMMUNITY): Payer: Self-pay | Admitting: *Deleted

## 2011-04-12 ENCOUNTER — Encounter (HOSPITAL_COMMUNITY): Payer: Self-pay | Admitting: Anesthesiology

## 2011-04-12 ENCOUNTER — Ambulatory Visit (HOSPITAL_COMMUNITY): Payer: Medicare Other | Admitting: Anesthesiology

## 2011-04-12 ENCOUNTER — Encounter (HOSPITAL_COMMUNITY)
Admission: RE | Disposition: A | Discharge status: Home / Self Care | Payer: Self-pay | Source: Ambulatory Visit | Attending: Gastroenterology

## 2011-04-12 DIAGNOSIS — K862 Cyst of pancreas: Secondary | ICD-10-CM | POA: Insufficient documentation

## 2011-04-12 DIAGNOSIS — K863 Pseudocyst of pancreas: Secondary | ICD-10-CM

## 2011-04-12 HISTORY — PX: EUS: SHX5427

## 2011-04-12 SURGERY — UPPER ENDOSCOPIC ULTRASOUND (EUS) LINEAR
Anesthesia: Monitor Anesthesia Care

## 2011-04-12 MED ORDER — CIPROFLOXACIN HCL 500 MG PO TABS: 500.0000 mg | ORAL_TABLET | Freq: Two times a day (BID) | ORAL | Status: AC

## 2011-04-12 MED ORDER — LACTATED RINGERS IV SOLN
INTRAVENOUS | Status: DC
  Administered 2011-04-12: 1000 mL via INTRAVENOUS

## 2011-04-12 MED ORDER — BUTAMBEN-TETRACAINE-BENZOCAINE 2-2-14 % EX AERO
INHALATION_SPRAY | CUTANEOUS | Status: DC | PRN
  Administered 2011-04-12: 2 via TOPICAL

## 2011-04-12 MED ORDER — PROPOFOL 10 MG/ML IV EMUL
INTRAVENOUS | Status: DC | PRN
  Administered 2011-04-12: 160 ug/kg/min via INTRAVENOUS

## 2011-04-12 MED ORDER — FENTANYL CITRATE 0.05 MG/ML IJ SOLN
INTRAMUSCULAR | Status: DC | PRN
  Administered 2011-04-12 (×2): 50 ug via INTRAVENOUS

## 2011-04-12 MED ORDER — MIDAZOLAM HCL 5 MG/5ML IJ SOLN
INTRAMUSCULAR | Status: DC | PRN
  Administered 2011-04-12 (×2): 1 mg via INTRAVENOUS

## 2011-04-12 MED ORDER — CIPROFLOXACIN IN D5W 400 MG/200ML IV SOLN
400.0000 mg | Freq: Once | INTRAVENOUS | Status: AC
  Administered 2011-04-12: 400 mg via INTRAVENOUS
  Filled 2011-04-12: qty 200

## 2011-04-12 NOTE — Op Note (Signed)
[facility] [address] [place], [place]  [address]  ENDOSCOPIC ULTRASOUND PROCEDURE REPORT  PATIENT:  Francisco [name], Francisco [name]  MR#:  [id number] BIRTHDATE:  27-Jun-1933  GENDER:  male ENDOSCOPIST:  Rachael Fee, MD PROCEDURE DATE:  04/12/2011 PROCEDURE:  Upper EUS w/FNA ASA CLASS:  Class III INDICATIONS:  incidentally noted cystic lesion in head of pancreas; no weight loss, no FH of pancreatic disease MEDICATIONS:   MAC sedation, administered by CRNA, cipro IV 400mg   DESCRIPTION OF PROCEDURE:   After the risks benefits and alternatives of the procedure were  explained, informed consent was obtained. The patient was then placed in the left, lateral, decubitus postion and IV sedation was administered. Throughout the procedure, the patient's blood pressure, pulse and oxygen saturations were monitored continuously.  Under direct visualization, the Pentax Radial EUS [id number] endoscope was introduced through the mouth and advanced to the second portion of the duodenum.  Water was used as necessary to provide an acoustic interface.  Upon completion of the imaging, water was removed and the patient was sent to the recovery room in satisfactory condition. <<PROCEDUREIMAGES>> Endoscopic findings (with radial and linear echoendoscopes): 1. Normal UGI tract EUS findings: 1. Mixid cysic/solid mass in head of pancreas. This is predominantly cystic but there is 1cm by 1cm solid, papillary like mass within the cyst. Total dimensions 2cm by 3cm. The mass abuts portal vein but there is a clear plane of normal tissue between the mass and vessel.  The lesion does not cause pancreatic duct or biliary dilation. I cannot tell if the lesion communicates with the main pancreatic duct but suspect that it does given it's size, location.  The cyst was incompletely aspirated with a single pass with a 19gauge BS EUS FNA needle.  FNA yielded 2-3cc of thick, clear fluid containing several flecks of  tissue. 2. Otherwise pancreatic parenchyma was normal; no other masses or signs of chronic pancreatitis 3. CBD was nromal, non-dilaed 4. No peripancreatic adenopathy 5. Limited views of liver, spleen, portal and splenic vessels were all normal Impression: Incidentally noted 2cm by 3cm mixed solid/cystic lesion in head of pancreas.  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 Fluid was sent for cytology, CEA and amylase.  He will complete 3 days of twice daily cipro.  ______________________________ Rachael Fee, MD  cc: Monica Becton, MD  n. eSIGNED:   Rachael Fee at 04/12/2011 11:15 AM  Jarold Motto, [phone]

## 2011-04-12 NOTE — Anesthesia Postprocedure Evaluation (Signed)
  Anesthesia Post-op Note  Patient: Francisco [name]  Procedure(s) Performed: Procedure(s) (LRB): UPPER ENDOSCOPIC ULTRASOUND (EUS) LINEAR (N/A)  Patient Location: PACU  Anesthesia Type: MAC  Level of Consciousness: oriented and sedated  Airway and Oxygen Therapy: Patient Spontanous Breathing  Post-op Pain: mild  Post-op Assessment: Post-op Vital signs reviewed, Patient's Cardiovascular Status Stable, Respiratory Function Stable and Patent Airway  Post-op Vital Signs: stable  Complications: No apparent anesthesia complications

## 2011-04-12 NOTE — Transfer of Care (Signed)
Immediate Anesthesia Transfer of Care Note  Patient: Francisco [name]  Procedure(s) Performed: Procedure(s) (LRB): UPPER ENDOSCOPIC ULTRASOUND (EUS) LINEAR (N/A)  Patient Location: PACU  Anesthesia Type: MAC  Level of Consciousness: sedated and patient cooperative  Airway & Oxygen Therapy: Patient Spontanous Breathing and Patient connected to nasal cannula oxygen  Post-op Assessment: Report given to PACU RN and Post -op Vital signs reviewed and stable  Post vital signs: Reviewed and stable  Complications: No apparent anesthesia complications

## 2011-04-12 NOTE — H&P (Signed)
  HPI: This is a man with incidentally noted pancreatic cyst    Past Medical History  Diagnosis Date  . Personal history of urinary calculi   . Neoplasm of uncertain behavior of prostate   . Elevated prostate specific antigen (PSA)   . Right ureteral stone   . Asthma with allergic rhinitis   . Pancreatic cyst FOLLOWED BY DR Tobie Hellen  . Impotence of organic origin     Past Surgical History  Procedure Date  . Percutaneous nephrostolithotomy 1985  . Cystoscopy with ureteroscopy 03/14/2011    Procedure: CYSTOSCOPY WITH URETEROSCOPY;  Surgeon: Milford Cage, MD;  Location: [facility];  Service: Urology;  Laterality: Right;  2 hours requested for this case  Right Ureter Stent Placement  C-ARM CAMERA DIGITAL URETEROSCOPE    . Kidney stone surgery March 2013    Current Facility-Administered Medications  Medication Dose Route Frequency Provider Last Rate Last Dose  . lactated ringers infusion   Intravenous Continuous Rachael Fee, MD 100 mL/hr at 04/12/11 0904 1,000 mL at 04/12/11 0904    Allergies as of 02/26/2011  . (No Known Allergies)    Family History  Problem Relation Age of Onset  . Diabetes Mother   . Diabetes Sister   . Cancer Brother     unknown type  . Stroke Father     History   Social History  . Marital Status: Married    Spouse Name: N/A    Number of Children: 1  . Years of Education: N/A   Occupational History  . auctioneer    Social History Main Topics  . Smoking status: Former Smoker -- 20 years    Types: Cigarettes    Quit date: 04/11/1991  . Smokeless tobacco: Never Used  . Alcohol Use: 0.6 oz/week    1 Cans of beer per week  . Drug Use: No  . Sexually Active: Not on file   Other Topics Concern  . Not on file   Social History Narrative  . No narrative on file      Physical Exam: BP 133/79  Temp(Src) 97.9 F (36.6 C) (Oral)  Resp 15  Ht 5\' 8"  (1.727 m)  Wt 180 lb (81.647 kg)  BMI 27.37 kg/m2  SpO2  98% Constitutional: generally well-appearing Psychiatric: alert and oriented x3 Abdomen: soft, nontender, nondistended, no obvious ascites, no peritoneal signs, normal bowel sounds     Assessment and plan: 79 y.o. male with incidentally noted pancreatic cyst  For upper EUS today

## 2011-04-12 NOTE — Anesthesia Preprocedure Evaluation (Signed)
Anesthesia Evaluation  Patient identified by MRN, date of birth, ID band Patient awake    Reviewed: Allergy & Precautions, H&P , NPO status , Patient's Chart, lab work & pertinent test results, reviewed documented beta blocker date and time   Airway Mallampati: II TM Distance: >3 FB Neck ROM: Full    Dental  (+) Partial Lower   Pulmonary neg pulmonary ROS,  breath sounds clear to auscultation        Cardiovascular negative cardio ROS  Rhythm:Regular Rate:Normal  Denies cardiac symptoms   Neuro/Psych negative neurological ROS  negative psych ROS   GI/Hepatic Neg liver ROS, Pancreatic cyst   Endo/Other  negative endocrine ROS  Renal/GU negative Renal ROS  negative genitourinary   Musculoskeletal negative musculoskeletal ROS (+)   Abdominal   Peds negative pediatric ROS (+)  Hematology negative hematology ROS (+)   Anesthesia Other Findings   Reproductive/Obstetrics negative OB ROS                           Anesthesia Physical Anesthesia Plan  ASA: II  Anesthesia Plan: MAC   Post-op Pain Management:    Induction: Intravenous  Airway Management Planned: Mask  Additional Equipment:   Intra-op Plan:   Post-operative Plan:   Informed Consent: I have reviewed the patients History and Physical, chart, labs and discussed the procedure including the risks, benefits and alternatives for the proposed anesthesia with the patient or authorized representative who has indicated his/her understanding and acceptance.   Dental advisory given  Plan Discussed with: CRNA and Surgeon  Anesthesia Plan Comments:         Anesthesia Quick Evaluation

## 2011-04-12 NOTE — Discharge Instructions (Signed)

## 2011-04-13 ENCOUNTER — Encounter (HOSPITAL_COMMUNITY): Payer: Self-pay | Admitting: Gastroenterology

## 2011-04-17 ENCOUNTER — Other Ambulatory Visit: Payer: Self-pay

## 2011-04-17 ENCOUNTER — Telehealth: Payer: Self-pay

## 2011-04-17 DIAGNOSIS — K8689 Other specified diseases of pancreas: Secondary | ICD-10-CM

## 2011-04-17 NOTE — Telephone Encounter (Signed)
Francisco [name] to notify pt  

## 2011-04-17 NOTE — Telephone Encounter (Signed)
Message copied by Donata Duff on Tue Apr 17, 2011  8:36 AM ------      Message from: Marnette Burgess      Created: Tue Apr 17, 2011  8:33 AM      Regarding: Referral       Patient is scheduled to see Dr. Almond Lint on 05/14/11 @ 9:00am, arrive @ 8:30am.  If you have any questions please call [phone].            Thank You,      Elane Fritz      ----- Message -----         From: Donata Duff, CMA         Sent: 04/17/2011   8:07 AM           To: Marnette Burgess            Dr. Donell Beers at [facility] to consider resection

## 2011-04-24 ENCOUNTER — Other Ambulatory Visit (INDEPENDENT_AMBULATORY_CARE_PROVIDER_SITE_OTHER): Payer: Self-pay | Admitting: General Surgery

## 2011-04-24 DIAGNOSIS — K862 Cyst of pancreas: Secondary | ICD-10-CM

## 2011-05-14 ENCOUNTER — Encounter (INDEPENDENT_AMBULATORY_CARE_PROVIDER_SITE_OTHER): Payer: Self-pay | Admitting: General Surgery

## 2011-05-14 ENCOUNTER — Ambulatory Visit (INDEPENDENT_AMBULATORY_CARE_PROVIDER_SITE_OTHER): Payer: Medicare Other | Admitting: General Surgery

## 2011-05-14 VITALS — BP 152/100 | HR 72 | Temp 97.4°F | Resp 14 | Ht 68.5 in | Wt 197.0 lb

## 2011-05-14 DIAGNOSIS — D490 Neoplasm of unspecified behavior of digestive system: Secondary | ICD-10-CM | POA: Insufficient documentation

## 2011-05-14 NOTE — Assessment & Plan Note (Signed)
Mr. Fulco has what appears to be a main duct IPMN based on endoscopic ultrasound.  This has a solid component of approximately a centimeter. 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 He has a very busy summer schedule. We discussed the Whipple procedure, including the rationale for the procedure.    I discussed the surgery with the patient including diagrams of anatomy.  I discussed the potential for diagnostic laparoscopy.  In the case of pancreatic cancer, if spread of the disease is found, we will abort the procedure and not proceed with resection.  The rationale for this was discussed with the patient.  There has not been data to support resection of Stage IV disease in terms of survival benefit.    We discussed possible complications including: Potential of aborting procedure if tumor is invading the superior mesenteric or hepatic arteries Bleeding Infection and possible wound complications such as hernia Damage to adjacent structures Leak of anastamoses, primarily pancreatic Possible need for other procedures Possible prolonged hospital stay Possible development of diabetes or worsening of current diabetes.  Possible pancreatic exocrine insufficiency Prolonged fatigue/weakness/appetite Difficulty with eating or post operative nausea Possible early recurrence of cancer   The patient understands and wishes to proceed. I will see him back in September.  The patient has been advised to turn in disability paperwork to our office.

## 2011-05-14 NOTE — Progress Notes (Signed)
Chief Complaint  Patient presents with  . Mass    Pancreatic    HISTORY: Patient is a 78 year old male who was found to have a pancreatic cyst on CT when he was evaluated for a renal stone. He subsequently underwent MRI. Based on the MRI report, he underwent endoscopic ultrasound for tissue sampling. On endoscopic ultrasound, he had a solid component of the mass and it appeared to be communicating with the main duct. Cytology was negative for malignant cells.  He is asymptomatic. He denies nausea, vomiting, early satiety, abdominal pain or back pain, no difficulty with his blood sugars that he is aware of. He has no family history of pancreatic cancer. He is otherwise well.  Past Medical History  Diagnosis Date  . Personal history of urinary calculi   . Neoplasm of uncertain behavior of prostate   . Elevated prostate specific antigen (PSA)   . Right ureteral stone   . Asthma with allergic rhinitis   . Pancreatic cyst FOLLOWED BY DR JACOBS  . Impotence of organic origin     Past Surgical History  Procedure Date  . Percutaneous nephrostolithotomy 1985  . Cystoscopy with ureteroscopy 03/14/2011    Procedure: CYSTOSCOPY WITH URETEROSCOPY;  Surgeon: Milford Cage, MD;  Location: [facility];  Service: Urology;  Laterality: Right;  2 hours requested for this case  Right Ureter Stent Placement  C-ARM CAMERA DIGITAL URETEROSCOPE    . Kidney stone surgery March 2013  . Eus 04/12/2011    Procedure: UPPER ENDOSCOPIC ULTRASOUND (EUS) LINEAR;  Surgeon: Rachael Fee, MD;  Location: [facility];  Service: Endoscopy;  Laterality: N/A;  radial linear/pt moved up a hour early by AW , Patty @ [facility] office ok'd & pt was called by AW    Current Outpatient Prescriptions  Medication Sig Dispense Refill  . desonide (DESOWEN) 0.05 % cream Apply topically 2 (two) times daily.      . Aspirin-Salicylamide-Caffeine (BC HEADACHE POWDER PO) Take by mouth as needed.      .  desoximetasone (TOPICORT) 0.25 % cream Apply 1 application topically as needed.       . finasteride (PROSCAR) 5 MG tablet Take 5 mg by mouth daily.      . hydrOXYzine (ATARAX/VISTARIL) 25 MG tablet Take 25 mg by mouth as needed.      Marland Kitchen oxyCODONE-acetaminophen (PERCOCET) 5-325 MG per tablet Take 1 tablet by mouth every 4 (four) hours as needed.         No Known Allergies   Family History  Problem Relation Age of Onset  . Diabetes Mother   . Diabetes Sister   . Cancer Brother     unknown type  . Stroke Father      History   Social History  . Marital Status: Married    Spouse Name: N/A    Number of Children: 1  . Years of Education: N/A   Occupational History  . auctioneer    Social History Main Topics  . Smoking status: Former Smoker -- 20 years    Types: Cigarettes    Quit date: 04/11/1991  . Smokeless tobacco: Never Used  . Alcohol Use: 0.6 oz/week    1 Cans of beer per week  . Drug Use: No  . Sexually Active: None   Other Topics Concern  . None   Social History Narrative  . None     REVIEW OF SYSTEMS - PERTINENT POSITIVES ONLY: 12 point review of systems negative other than HPI  and PMH.    EXAM: Filed Vitals:   05/14/11 0838  BP: 152/100  Pulse: 72  Temp: 97.4 F (36.3 C)  Resp: 14    Gen:  No acute distress.  Well nourished and well groomed.   Neurological: Alert and oriented to person, place, and time. Coordination normal.  Head: Normocephalic and atraumatic.  Eyes: Conjunctivae are normal. Pupils are equal, round, and reactive to light. No scleral icterus.  Neck: Normal range of motion. Neck supple. No tracheal deviation or thyromegaly present.  Cardiovascular: Normal rate, regular rhythm, normal heart sounds and intact distal pulses.  Exam reveals no gallop and no friction rub.  No murmur heard. Respiratory: Effort normal.  No respiratory distress. No chest wall tenderness. Breath sounds normal.  No wheezes, rales or rhonchi.  GI: Soft. Bowel  sounds are normal. The abdomen is soft and nontender.  There is no rebound and no guarding.  Musculoskeletal: Normal range of motion. Extremities are nontender.  Lymphadenopathy: No cervical, preauricular, postauricular or axillary adenopathy is present Skin: Skin is warm and dry. No rash noted. No diaphoresis. No erythema. No pallor. No clubbing, cyanosis, or edema.   Psychiatric: Normal mood and affect. Behavior is normal. Judgment and thought content normal.    LABORATORY RESULTS: Cyst fluid CEA 1,570 ng/mL, amylase 31,240 u/L; cytology negative. EUS images shows this is mixed solid/cystic lesion (predominantly cystic) 2cm by 3cm in head of panc.    RADIOLOGY RESULTS: MRI abd/pelvis IMPRESSION:  2.6 x 1.7 cm cystic lesion in the region of the pancreatic  head/uncinate process. Possible ductal communication without main  pancreatic duct dilatation, suggesting a side-branch IPMN.  Adjacent smaller cystic lesions in the uncinate process. No  enhancement. Given the size, endoscopic ultrasound with tissue  sampling should be considered. Otherwise, follow-up MRI  with/without contrast is suggested in this 6 months.  Bilateral renal cysts. No suspicious/enhancing renal lesions.  Cholelithiasis, without associated inflammatory changes.  Hepatic cysts. Hepatic steatosis.   ASSESSMENT AND PLAN: IPMN (intraductal papillary mucinous neoplasm), probable main duct, solid component.   Mr. Littler has what appears to be a main duct IPMN based on endoscopic ultrasound.  This has a solid component of approximately a centimeter. 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 He has a very busy summer schedule. We discussed the Whipple procedure, including the rationale for the procedure.    I discussed the surgery with the patient including diagrams of anatomy.  I discussed the potential for diagnostic laparoscopy.  In the case of pancreatic cancer, if spread of the disease  is found, we will abort the procedure and not proceed with resection.  The rationale for this was discussed with the patient.  There has not been data to support resection of Stage IV disease in terms of survival benefit.    We discussed possible complications including: Potential of aborting procedure if tumor is invading the superior mesenteric or hepatic arteries Bleeding Infection and possible wound complications such as hernia Damage to adjacent structures Leak of anastamoses, primarily pancreatic Possible need for other procedures Possible prolonged hospital stay Possible development of diabetes or worsening of current diabetes.  Possible pancreatic exocrine insufficiency Prolonged fatigue/weakness/appetite Difficulty with eating or post operative nausea Possible early recurrence of cancer   The patient understands and wishes to proceed. I will see him back in September.  The patient has been advised to turn in disability paperwork to our office.  Maudry Diego MD Surgical Oncology, General and Endocrine Surgery [facility]      Visit Diagnoses: 1. IPMN (intraductal papillary mucinous neoplasm), probable main duct, solid component.       Primary Care Physician: Minda Meo, MD, MD

## 2011-05-14 NOTE — Patient Instructions (Signed)
WHIPPLE (PANCREATICODUODENECTOMY) THE OPERATION: The goal of the operation is to remove masses in the head of the pancreas, the distal bile duct, or the duodenum.  The structures that are removed include the head of the pancreas, the duodenum, the gallbladder, and a small portion of your stomach.  The jejunum, or middle part of the small intestine, is then used to reconnect the bile duct, the pancreas, and the stomach to the intestinal tract.  The reason so much needs to be removed is that the blood supply and lymphatic channels and nodes are closely interconnected.  The operation takes between 4 and 8 hours depending on the amount of scar tissue you have present from prior surgeries, or from the mass.    WHAT HAPPENS IN THE OPERATING ROOM: I will start the operation with a diagnostic laparoscopy.  This means we will make small incisions to see if there is visible cancer outside of the pancreas.  If there is visible spread of cancer, I will stop the operation because research has shown that survival for pancreatic cancer is worse if you undergo a big operation without being able to remove all the cancer.  If no other cancer is seen, I will make a bigger incision on your abdomen and begin mobilizing the stomach, duodenum and gallbladder to see if the tumor is able to be removed.  Sometimes the tumor is too adherent to the arteries and veins supplying the liver and small intestine that it is unsafe or not possible to remove the tumor.  If this is the case, the procedure would be stopped and the tumor would be left in place.  Whatever the operative findings, I will discuss the case with your family after we are done in the operating room.  I will talk to you in the next few days when you are more awake.  I will see you in the hospital every weekday that I am not out of town.  My partners help see patients on the weekends and if I am out of town.    WHAT HAPPENS AFTER SURGERY: After surgery, you will go  first to the recovery room, then to the ICU for careful monitoring.  It is important that I am able to know your vital signs and urine output to see how you are doing.  Depending on many factors, you may be in the ICU overnight, or for several days.  You will have many tubes and lines in place which is standard for this operation.  You will have several IVs, including possibly a central line in your chest or neck.  You will likely have an arterial line in your wrist.  You will have a tube in your nose for 4-5 days in order to suction out the stomach and liver secretions to help the surgical connections heal.  YOU WILL NOT BE ABLE TO EAT FOR AT LEAST 3-4 DAYS AFTER SURGERY.  You will have a catheter in your bladder.  On your abdomen, you will have several surgical drains and possibly a pain pump with numbing medicine.  You will have compression stockings on your legs to decrease the risk of blood clots.    Sometimes anesthesia makes a decision that you may need to be left on the breathing machine for a period after surgery.  This may be based on your overall health status, or events during surgery.    We will address your pain in several ways.  We will use an IV pain pump  called a "PCA," or Patient Controlled Analgesia.  This allows you to press a button and immediately receive a dose of pain medication without waiting for a nurse.  We also use IV Tylenol and sometimes IV Toradol which is similar to ibuprofen.  You may also have a pump with numbing medicine delivered directly to your incision.  I use doses and medications that work for the majority of people, but you may need an adjustment to the dose or type of medicine if your pain is not adequately controlled.  Your throat may be sore, in which case you may need a throat spray or lozenges.    We will ask you to get out of bed the day after surgery in order to maximize your chances of not having complications.  Your risk of pneumonia and blood clots is lower  with walking and sitting in the chair.  We will also ask you to perform breathing exercises.  We will also ask to you walk in your room and in the halls for the above reasons, but also in order for you to keep up your strength.    EATING: We will usually start you on clear liquids in around 5 days if your bowel function seems to have returned.  We advance your diet slowly to make sure you are tolerating each step.  All patients do not have a normal appetite when they go home and usually have to take 2-4 cans of nutritional supplement per day while this is improving.  Most patients also find that their taste buds do not seem the same right after surgery, and this can continue into the time of possible post operative chemotherapy and radiation.  Some patients develop diabetes and will need assistance from a primary care doctor for medication.    OTHER TREATMENT? I will present your case when the pathology is available at our GI Multidisciplinary conference which occurs every 1-2 weeks.  Medical and Radiation Oncologists are present, as well as the pathologist and radiologist in addition to myself.  If you have a larger tumor or if you have positive lymph nodes, we may have the oncologist stop by to see you while you are in the hospital.  Most firm post op treatment plans occur, however, once you are an outpatient because we will need to see how you are recovering from surgery.  GOING HOME! Usually you are able to go home in 8-14 days, depending on whether or not complications happen and what is going on with your overall health status.   If you have more health problems or if you have limited help at home, the therapists and nurses may recommend a temporary rehab or nursing facility to help you get back on your feet before you go home.  These decisions would be made while you are in the hospital with the assistance of a social worker or case manager.    Please bring all insurance/disability forms to our  office for the staff to fill out   POSSIBLE COMPLICATIONS This is a very extensive operation and includes complications listed below: Bleeding Infection and possible wound complications such as hernia Damage to adjacent structures Leak of surgical connections, the worst of which is the connection between the intestine and the pancreas (20%) Possible need for other procedures, such as abscess drains in radiology or endoscopy.   Possible prolonged hospital stay Possible development of diabetes or worsening of current diabetes. (5-20%) Possible diarrhea from lack of pancreatic enzymes.   Prolonged   fatigue/weakness/appetite MOST PATIENTS' ENERGY LEVEL IS NOT BACK TO NORMAL FOR AT LEAST 4-6 MONTHS.  OLDER PATIENTS MAY FEEL WEAK FOR LONGER PERIODS OF TIME.   Difficulty with eating or post operative nausea (around 30%) Possible early recurrence of cancer Possible complications of your medical problems such as heart disease or arrhythmias. Death (less than 2%)  All possible complications are not listed, just the most common.    FURTHER INFORMATION? Please ask questions if you find something that we did not discuss in the office and would like more information.  If you would like another appointment if you have many questions or if your family members would like to come as well, please contact the office.    IF YOU ARE TAKING ASPIRIN, PLAVIX, COUMADIN, OR OTHER BLOOD THINNERS, LET us KNOW IMMEDIATELY SO WE CAN CONTACT YOUR PRESCRIBING HEALTH CARE PROVIDER TO HOLD THE MEDICATION FOR 5-7 DAYS BEFORE SURGERY

## 2011-05-15 ENCOUNTER — Encounter: Payer: Self-pay | Admitting: Gastroenterology

## 2011-09-11 ENCOUNTER — Telehealth (INDEPENDENT_AMBULATORY_CARE_PROVIDER_SITE_OTHER): Payer: Self-pay

## 2011-09-11 NOTE — Telephone Encounter (Signed)
Pt has been to [facility] to seek a second opinion.  He has a f/u appt there.  Afterwards, he will contact Dr. Donell Beers if he is interested in her performing surgery.

## 2011-09-19 DIAGNOSIS — N200 Calculus of kidney: Secondary | ICD-10-CM

## 2011-09-19 HISTORY — DX: Calculus of kidney: N20.0

## 2011-09-28 DIAGNOSIS — E785 Hyperlipidemia, unspecified: Secondary | ICD-10-CM | POA: Insufficient documentation

## 2011-11-26 DIAGNOSIS — N400 Enlarged prostate without lower urinary tract symptoms: Secondary | ICD-10-CM | POA: Insufficient documentation

## 2011-12-02 HISTORY — PX: WHIPPLE PROCEDURE: SHX2667

## 2012-05-13 ENCOUNTER — Emergency Department (HOSPITAL_COMMUNITY)
Admission: EM | Admit: 2012-05-13 | Discharge: 2012-05-14 | Discharge status: Against Medical Advice | Payer: Medicare Other | Attending: Emergency Medicine | Admitting: Emergency Medicine

## 2012-05-13 ENCOUNTER — Emergency Department (HOSPITAL_COMMUNITY): Payer: Medicare Other

## 2012-05-13 ENCOUNTER — Encounter (HOSPITAL_COMMUNITY): Payer: Self-pay | Admitting: *Deleted

## 2012-05-13 DIAGNOSIS — IMO0001 Reserved for inherently not codable concepts without codable children: Secondary | ICD-10-CM

## 2012-05-13 DIAGNOSIS — Z87442 Personal history of urinary calculi: Secondary | ICD-10-CM | POA: Insufficient documentation

## 2012-05-13 DIAGNOSIS — L02219 Cutaneous abscess of trunk, unspecified: Secondary | ICD-10-CM | POA: Insufficient documentation

## 2012-05-13 DIAGNOSIS — Z7982 Long term (current) use of aspirin: Secondary | ICD-10-CM | POA: Insufficient documentation

## 2012-05-13 DIAGNOSIS — J45909 Unspecified asthma, uncomplicated: Secondary | ICD-10-CM | POA: Insufficient documentation

## 2012-05-13 DIAGNOSIS — Y838 Other surgical procedures as the cause of abnormal reaction of the patient, or of later complication, without mention of misadventure at the time of the procedure: Secondary | ICD-10-CM | POA: Insufficient documentation

## 2012-05-13 DIAGNOSIS — Z8719 Personal history of other diseases of the digestive system: Secondary | ICD-10-CM | POA: Insufficient documentation

## 2012-05-13 DIAGNOSIS — L02211 Cutaneous abscess of abdominal wall: Secondary | ICD-10-CM

## 2012-05-13 DIAGNOSIS — T8140XA Infection following a procedure, unspecified, initial encounter: Secondary | ICD-10-CM | POA: Insufficient documentation

## 2012-05-13 DIAGNOSIS — E876 Hypokalemia: Secondary | ICD-10-CM | POA: Insufficient documentation

## 2012-05-13 DIAGNOSIS — Z87448 Personal history of other diseases of urinary system: Secondary | ICD-10-CM | POA: Insufficient documentation

## 2012-05-13 DIAGNOSIS — Z79899 Other long term (current) drug therapy: Secondary | ICD-10-CM | POA: Insufficient documentation

## 2012-05-13 DIAGNOSIS — Z9889 Other specified postprocedural states: Secondary | ICD-10-CM | POA: Insufficient documentation

## 2012-05-13 DIAGNOSIS — Z87891 Personal history of nicotine dependence: Secondary | ICD-10-CM | POA: Insufficient documentation

## 2012-05-13 DIAGNOSIS — Z8546 Personal history of malignant neoplasm of prostate: Secondary | ICD-10-CM | POA: Insufficient documentation

## 2012-05-13 LAB — COMPREHENSIVE METABOLIC PANEL
ALT: 13 U/L (ref 0–53)
AST: 18 U/L (ref 0–37)
Albumin: 1.8 g/dL — ABNORMAL LOW (ref 3.5–5.2)
Alkaline Phosphatase: 140 U/L — ABNORMAL HIGH (ref 39–117)
BUN: 13 mg/dL (ref 6–23)
CO2: 27 mEq/L (ref 19–32)
Calcium: 8.7 mg/dL (ref 8.4–10.5)
Chloride: 96 mEq/L (ref 96–112)
Creatinine, Ser: 0.48 mg/dL — ABNORMAL LOW (ref 0.50–1.35)
GFR calc Af Amer: >90 mL/min (ref >90)
GFR calc non Af Amer: >90 mL/min (ref >90)
Glucose, Bld: 95 mg/dL (ref 70–99)
Potassium: 2.9 mEq/L — ABNORMAL LOW (ref 3.5–5.1)
Sodium: 133 mEq/L — ABNORMAL LOW (ref 135–145)
Total Bilirubin: 0.2 mg/dL — ABNORMAL LOW (ref 0.3–1.2)
Total Protein: 5.3 g/dL — ABNORMAL LOW (ref 6.0–8.3)

## 2012-05-13 LAB — LIPASE, BLOOD: Lipase: 14 U/L (ref 11–59)

## 2012-05-13 LAB — CBC WITH DIFFERENTIAL/PLATELET
Basophils Absolute: 0 10*3/uL (ref 0.0–0.1)
Basophils Relative: 0 % (ref 0–1)
Eosinophils Absolute: 0 10*3/uL (ref 0.0–0.7)
Eosinophils Relative: 1 % (ref 0–5)
HCT: 34 % — ABNORMAL LOW (ref 39.0–52.0)
Hemoglobin: 11.2 g/dL — ABNORMAL LOW (ref 13.0–17.0)
Lymphocytes Relative: 21 % (ref 12–46)
Lymphs Abs: 1.6 10*3/uL (ref 0.7–4.0)
MCH: 26.3 pg (ref 26.0–34.0)
MCHC: 32.9 g/dL (ref 30.0–36.0)
MCV: 79.8 fL (ref 78.0–100.0)
Monocytes Absolute: 0.5 10*3/uL (ref 0.1–1.0)
Monocytes Relative: 6 % (ref 3–12)
Neutro Abs: 5.7 10*3/uL (ref 1.7–7.7)
Neutrophils Relative %: 73 % (ref 43–77)
Platelets: 727 10*3/uL — ABNORMAL HIGH (ref 150–400)
RBC: 4.26 MIL/uL (ref 4.22–5.81)
RDW: 16.2 % — ABNORMAL HIGH (ref 11.5–15.5)
WBC: 7.9 10*3/uL (ref 4.0–10.5)

## 2012-05-13 MED ORDER — VANCOMYCIN HCL IN DEXTROSE 1-5 GM/200ML-% IV SOLN
1000.0000 mg | Freq: Once | INTRAVENOUS | Status: AC
  Administered 2012-05-13: 1000 mg via INTRAVENOUS
  Filled 2012-05-13: qty 200

## 2012-05-13 MED ORDER — POTASSIUM CHLORIDE 10 MEQ/100ML IV SOLN
10.0000 meq | INTRAVENOUS | Status: AC
  Administered 2012-05-13 – 2012-05-14 (×3): 10 meq via INTRAVENOUS
  Filled 2012-05-13 (×3): qty 100

## 2012-05-13 MED ORDER — SODIUM CHLORIDE 0.9 % IV BOLUS (SEPSIS)
1000.0000 mL | Freq: Once | INTRAVENOUS | Status: AC
  Administered 2012-05-13: 1000 mL via INTRAVENOUS

## 2012-05-13 MED ORDER — SODIUM CHLORIDE 0.9 % IV SOLN
Freq: Once | INTRAVENOUS | Status: AC
  Administered 2012-05-13: 22:00:00 via INTRAVENOUS

## 2012-05-13 MED ORDER — IOHEXOL 300 MG/ML  SOLN
100.0000 mL | Freq: Once | INTRAMUSCULAR | Status: AC | PRN
  Administered 2012-05-13: 100 mL via INTRAVENOUS

## 2012-05-13 MED ORDER — PIPERACILLIN-TAZOBACTAM 3.375 G IVPB 30 MIN
3.3750 g | Freq: Once | INTRAVENOUS | Status: AC
  Administered 2012-05-13: 3.375 g via INTRAVENOUS
  Filled 2012-05-13: qty 50

## 2012-05-13 MED ORDER — IOHEXOL 300 MG/ML  SOLN
50.0000 mL | Freq: Once | INTRAMUSCULAR | Status: AC | PRN
  Administered 2012-05-13: 50 mL via INTRAVENOUS

## 2012-05-13 NOTE — ED Notes (Signed)
Pt has had multiple abd surgeries and wound vac for a "pancreas surgery" since march. Pt reports a wound draining from abd starting at 2am last night. Reports foul odor. Pt denies pain until "you start messing with it."

## 2012-05-13 NOTE — ED Provider Notes (Signed)
History    79 year old male presenting for evaluation of drainage from abdominal wound. Patient noted foul-smelling drainage coming from the site of what sounds like previous JP drain.  He started noticing the drainage early this morning which has persisted. Pt with wound vac to anterior abdominal wound. This was left off by visit nurse today though because of  Localized tenderness around this area. No fevers or chills. No nausea or vomiting. Patient is status post Whipple done in December. Surgery by Dr Joselyn Glassman at [facility].   CSN: [id number]  Arrival date & time 05/13/12  1758   First MD Initiated Contact with Patient 05/13/12 1829      Chief Complaint  Patient presents with  . Post-op Problem    (Consider location/radiation/quality/duration/timing/severity/associated sxs/prior treatment) HPI  Past Medical History  Diagnosis Date  . Personal history of urinary calculi   . Neoplasm of uncertain behavior of prostate   . Elevated prostate specific antigen (PSA)   . Right ureteral stone   . Asthma with allergic rhinitis   . Pancreatic cyst FOLLOWED BY DR JACOBS  . Impotence of organic origin     Past Surgical History  Procedure Laterality Date  . Percutaneous nephrostolithotomy  1985  . Cystoscopy with ureteroscopy  03/14/2011    Procedure: CYSTOSCOPY WITH URETEROSCOPY;  Surgeon: Milford Cage, MD;  [facility]: [facility];  Service: Urology;  Laterality: Right;  2 hours requested for this case  Right Ureter Stent Placement  C-ARM CAMERA DIGITAL URETEROSCOPE    . Kidney stone surgery  March 2013  . Eus  04/12/2011    Procedure: UPPER ENDOSCOPIC ULTRASOUND (EUS) LINEAR;  Surgeon: Rachael Fee, MD;  Location: [facility];  Service: Endoscopy;  Laterality: N/A;  radial linear/pt moved up a hour early by AW , Patty @ [place] office ok'd & pt was called by AW    Family History  Problem Relation Age of Onset  . Diabetes Mother   . Diabetes Sister   . Cancer  Brother     unknown type  . Stroke Father     History  Substance Use Topics  . Smoking status: Former Smoker -- 20 years    Types: Cigarettes    Quit date: 04/11/1991  . Smokeless tobacco: Never Used  . Alcohol Use: 0.6 oz/week    1 Cans of beer per week      Review of Systems  All systems reviewed and negative, other than as noted in HPI.   Allergies  Review of patient's allergies indicates no known allergies.  Home Medications   Current Outpatient Rx  Name  Route  Sig  Dispense  Refill  . ACCU-CHEK FASTCLIX LANCETS MISC   Does not apply   by Does not apply route daily.         Marland Kitchen aspirin 81 MG tablet   Oral   Take 81 mg by mouth daily.         Marland Kitchen desonide (DESOWEN) 0.05 % cream   Topical   Apply topically 2 (two) times daily.         Marland Kitchen desoximetasone (TOPICORT) 0.25 % cream   Topical   Apply 1 application topically as needed.          . finasteride (PROSCAR) 5 MG tablet   Oral   Take 5 mg by mouth daily.         Marland Kitchen glucose blood (ACCU-CHEK SMARTVIEW) test strip   Other   1 each by Other route  daily. Use as instructed         . metFORMIN (GLUCOPHAGE) 500 MG tablet   Oral   Take 500 mg by mouth 2 (two) times daily with a meal.         . naproxen sodium (ANAPROX) 220 MG tablet   Oral   Take 220 mg by mouth continuous as needed (for pain).         Marland Kitchen omeprazole (PRILOSEC) 20 MG capsule   Oral   Take 20 mg by mouth daily.         . ondansetron (ZOFRAN) 8 MG tablet   Oral   Take by mouth every 8 (eight) hours as needed for nausea.         Marland Kitchen oxycodone (OXY-IR) 5 MG capsule   Oral   Take 5 mg by mouth every 6 (six) hours as needed for pain.         Marland Kitchen oxyCODONE-acetaminophen (PERCOCET) 5-325 MG per tablet   Oral   Take 1 tablet by mouth every 4 (four) hours as needed.         . triamcinolone cream (KENALOG) 0.1 %   Topical   Apply topically 2 (two) times daily.           BP 102/67  Pulse 89  Temp(Src) 97.7 F (36.5  C) (Oral)  Resp 22  SpO2 99%  Physical Exam  Nursing note and vitals reviewed. Constitutional: He appears well-developed and well-nourished. No distress.  Laying in bed. NAd.   HENT:  Head: Normocephalic and atraumatic.  Eyes: Conjunctivae are normal. Right eye exhibits no discharge. Left eye exhibits no discharge.  Neck: Neck supple.  Cardiovascular: Normal rate, regular rhythm and normal heart sounds.  Exam reveals no gallop and no friction rub.   No murmur heard. Pulmonary/Chest: Effort normal and breath sounds normal. No respiratory distress.  Abdominal: Soft. He exhibits no distension. There is no tenderness.  Roughly horizontal wound just above umbilicus. Small area to this right of this with purulent drainage with palpation. Pt reports this is site of previous drain. Localized tenderness extending laterally with some mild erythema and induration.   Musculoskeletal: He exhibits no edema and no tenderness.  Neurological: He is alert.  Skin: Skin is warm and dry.  Psychiatric: He has a normal mood and affect. His behavior is normal. Thought content normal.    ED Course  Procedures (including critical care time)  Labs Reviewed  CBC WITH DIFFERENTIAL - Abnormal; Notable for the following:    Hemoglobin 11.2 (*)    HCT 34.0 (*)    RDW 16.2 (*)    Platelets 727 (*)    All other components within normal limits  COMPREHENSIVE METABOLIC PANEL - Abnormal; Notable for the following:    Sodium 133 (*)    Potassium 2.9 (*)    Creatinine, Ser 0.48 (*)    Total Protein 5.3 (*)    Albumin 1.8 (*)    Alkaline Phosphatase 140 (*)    Total Bilirubin 0.2 (*)    All other components within normal limits  LIPASE, BLOOD  URINALYSIS, ROUTINE W REFLEX MICROSCOPIC   Ct Abdomen Pelvis W Contrast  05/13/2012  *RADIOLOGY REPORT*  Clinical Data: Purulent drainage from the JP drain site.  CT ABDOMEN AND PELVIS WITH CONTRAST  Technique:  Multidetector CT imaging of the abdomen and pelvis was  performed following the standard protocol during bolus administration of intravenous contrast.  Contrast: 50mL OMNIPAQUE IOHEXOL 300 MG/ML  SOLN,  OMNIPAQUE IOHEXOL 300 MG/ML  SOLN  Comparison: 02/10/2011 MRI, 02/01/2011 CT.  Findings: Mild dependent atelectasis right lung base.  Heart size normal limits.  Coronary artery calcification.  Hepatic steatosis and heterogeneous attenuation.  Lobulated cystic lesion within the left hepatic lobe measures up to 4 cm, similar to prior. Pneumobilia. Status post Whipple. Atrophy of the body and tail the pancreas.  Adjacent to the choledochojejunostomy and hepatic flexure, there are multi focal loculated extraluminal fluid pockets, measuring up to 2.5 x 1.9 cm.  Adjacent stranding of the mesenteric fat and an air-fluid tract extending anteromedially to the peritoneal surface as seen on series 2 image 32.  Mixed air fluid collection within the subcutaneous fat of the right anterolateral abdomen measures up to 2.3 x 6.2 cm, with communication with the skin surface as seen on image 36.  Unremarkable adrenal glands.  Nonobstructing right renal stone. Upper pole right renal cyst.  No hydronephrosis or hydroureter.  No CT evidence for colitis.  Normal appendix.  Small bowel loops are of a nonobstructive pattern.  No free intraperitoneal air. Reactive size lymph nodes.  There is scattered atherosclerotic calcification of the aorta and its branches. No aneurysmal dilatation.  Partially decompressed bladder.  Marked prostatomegaly.  Sclerotic focus right iliac bone is unchanged.  Interval L1 and L4 superior endplate compression fractures.  No retropulsion.  IMPRESSION: Multi focal fluid pockets within the right upper abdomen adjacent to the choledochojejunostomy and hepatic flexure, concerning for developing abscesses, measuring up to 2.5 cm.  An underlying leak is not excluded though no enteric contrast extravasation is visualized.  Recommend comparison with prior postoperative  imaging (none available in our system).  Mixed air fluid collection within the right anterolateral subcutaneous fat communicates with the skin surface and is concerning for infection/abscess.  Interval L1 vertebral body and L4 superior endplate compression fractures. Correlate for point tenderness.  Discussed via telephone with Dr. Juleen China at 10:30 p.m. on 05/13/2012.   Original Report Authenticated By: Jearld Lesch, M.D.      1. Abscess of abdominal wall   2. Postoperative intra-abdominal abscess, initial encounter   3. Hypokalemia       MDM  79 year old male with purulent-appearing material draining from abdominal wound. Patient is status post Whipple. Labs, CT, Abx. Will discuss with surgery. Anticipate transfer.  Patient is refusing transfer nor does he want to be admitted to hospital here. "There is no damn way I'm getting in an ambulance." He has medical decision-making capability. He understands that he has an infectious process that may continue to worsen and my recommendation is monitored transportation. He understands the benefit of this and the potential risks going by private transportation. Difficult to obtain IV access on pt. I think is it reasonable to discharge with IV in place, but patient instructed that needs to immediately go to the emergency room at [facility].      Raeford Razor, MD 05/15/12 [phone]

## 2012-05-13 NOTE — ED Notes (Signed)
Pt requesting to leave AMA to drive POV to [facility].  Pt understands risks of leaving and has spoke with EDP about this.  Dr. Juleen China has stated that pt's IV may be left in place during POV trip to [facility] from [place] ED.  Pt has stated that he will leave for [facility] at 0730 on 05/14/12 after being discharged from the ED.  Dr. Juleen China stated that he understands it will be this long in between pt being in the ED to leaving for [facility] in the morning.  Dr. Juleen China stated that pt may still have IV after d/c d/t pt being a difficult stick and needing IV access while at [facility].  Charge RN made aware and stated to be sure Dr. Juleen China understand the time difference and that pt will not be leaving straight from here.

## 2012-05-13 NOTE — ED Notes (Signed)
Pt has finished drinking contrast at this time

## 2012-05-13 NOTE — ED Notes (Signed)
Unable to insert in x1 iv team called

## 2012-05-13 NOTE — ED Notes (Signed)
IV team paged again, stated it will be awhile before she is able to come to ED.

## 2012-05-13 NOTE — ED Notes (Signed)
IV team at bedside 

## 2012-05-14 LAB — URINALYSIS, ROUTINE W REFLEX MICROSCOPIC
Bilirubin Urine: NEGATIVE
Glucose, UA: NEGATIVE mg/dL
Ketones, ur: NEGATIVE mg/dL
Nitrite: NEGATIVE
Protein, ur: NEGATIVE mg/dL
Specific Gravity, Urine: 1.045 — ABNORMAL HIGH (ref 1.005–1.030)
Urobilinogen, UA: 0.2 mg/dL (ref 0.0–1.0)
pH: 7 (ref 5.0–8.0)

## 2012-05-14 LAB — URINE MICROSCOPIC-ADD ON

## 2012-05-14 NOTE — ED Notes (Signed)
Patient left AMA during downtime. States that he would follow-up with [facility] in the morning. Patient signed downtime discharge paper.

## 2012-07-01 ENCOUNTER — Ambulatory Visit: Payer: Medicare Other | Attending: Surgical Oncology | Admitting: Physical Therapy

## 2012-07-01 DIAGNOSIS — R269 Unspecified abnormalities of gait and mobility: Secondary | ICD-10-CM | POA: Insufficient documentation

## 2012-07-01 DIAGNOSIS — IMO0001 Reserved for inherently not codable concepts without codable children: Secondary | ICD-10-CM | POA: Insufficient documentation

## 2012-07-01 DIAGNOSIS — R5381 Other malaise: Secondary | ICD-10-CM | POA: Insufficient documentation

## 2012-07-08 ENCOUNTER — Ambulatory Visit: Payer: Medicare Other | Admitting: Physical Therapy

## 2012-07-10 ENCOUNTER — Ambulatory Visit: Payer: Medicare Other | Admitting: Physical Therapy

## 2012-07-15 ENCOUNTER — Ambulatory Visit: Payer: Medicare Other | Admitting: Physical Therapy

## 2012-07-17 ENCOUNTER — Ambulatory Visit: Payer: Medicare Other | Admitting: Physical Therapy

## 2012-07-22 ENCOUNTER — Ambulatory Visit: Payer: Medicare Other | Admitting: Physical Therapy

## 2012-07-24 ENCOUNTER — Ambulatory Visit: Payer: Medicare Other | Admitting: Physical Therapy

## 2012-07-29 ENCOUNTER — Ambulatory Visit: Payer: Medicare Other | Admitting: Physical Therapy

## 2012-07-31 ENCOUNTER — Ambulatory Visit: Payer: Medicare Other | Admitting: Physical Therapy

## 2012-08-05 ENCOUNTER — Ambulatory Visit: Payer: Medicare Other | Attending: Surgical Oncology | Admitting: Physical Therapy

## 2012-08-05 DIAGNOSIS — IMO0001 Reserved for inherently not codable concepts without codable children: Secondary | ICD-10-CM | POA: Insufficient documentation

## 2012-08-05 DIAGNOSIS — R269 Unspecified abnormalities of gait and mobility: Secondary | ICD-10-CM | POA: Insufficient documentation

## 2012-08-05 DIAGNOSIS — R5381 Other malaise: Secondary | ICD-10-CM | POA: Insufficient documentation

## 2012-08-12 ENCOUNTER — Ambulatory Visit: Payer: Medicare Other | Admitting: Physical Therapy

## 2012-08-19 ENCOUNTER — Ambulatory Visit: Payer: Medicare Other | Admitting: Physical Therapy

## 2012-08-26 ENCOUNTER — Ambulatory Visit: Payer: Medicare Other | Admitting: Physical Therapy

## 2012-08-28 ENCOUNTER — Ambulatory Visit: Payer: Medicare Other | Admitting: Physical Therapy

## 2012-10-09 ENCOUNTER — Other Ambulatory Visit (HOSPITAL_COMMUNITY): Payer: Self-pay | Admitting: Internal Medicine

## 2012-10-09 ENCOUNTER — Ambulatory Visit (HOSPITAL_COMMUNITY)
Admission: RE | Admit: 2012-10-09 | Discharge: 2012-10-09 | Disposition: A | Discharge status: Home / Self Care | Payer: Medicare Other | Source: Ambulatory Visit | Attending: Vascular Surgery | Admitting: Vascular Surgery

## 2012-10-09 DIAGNOSIS — M7989 Other specified soft tissue disorders: Secondary | ICD-10-CM

## 2012-10-09 DIAGNOSIS — M79609 Pain in unspecified limb: Secondary | ICD-10-CM

## 2014-10-20 DIAGNOSIS — Z0000 Encounter for general adult medical examination without abnormal findings: Secondary | ICD-10-CM | POA: Insufficient documentation

## 2014-10-27 DIAGNOSIS — D692 Other nonthrombocytopenic purpura: Secondary | ICD-10-CM | POA: Insufficient documentation

## 2015-03-03 DIAGNOSIS — N281 Cyst of kidney, acquired: Secondary | ICD-10-CM | POA: Diagnosis not present

## 2015-03-03 DIAGNOSIS — R3915 Urgency of urination: Secondary | ICD-10-CM | POA: Diagnosis not present

## 2015-03-03 DIAGNOSIS — N138 Other obstructive and reflux uropathy: Secondary | ICD-10-CM | POA: Diagnosis not present

## 2015-03-03 DIAGNOSIS — N401 Enlarged prostate with lower urinary tract symptoms: Secondary | ICD-10-CM | POA: Diagnosis not present

## 2015-03-03 DIAGNOSIS — R3912 Poor urinary stream: Secondary | ICD-10-CM | POA: Diagnosis not present

## 2015-03-03 DIAGNOSIS — Z0000 Encounter for general adult medical examination without abnormal findings: Secondary | ICD-10-CM | POA: Diagnosis not present

## 2015-03-03 DIAGNOSIS — N200 Calculus of kidney: Secondary | ICD-10-CM | POA: Diagnosis not present

## 2015-03-07 DIAGNOSIS — N200 Calculus of kidney: Secondary | ICD-10-CM | POA: Diagnosis not present

## 2015-03-07 DIAGNOSIS — K219 Gastro-esophageal reflux disease without esophagitis: Secondary | ICD-10-CM | POA: Diagnosis not present

## 2015-03-07 DIAGNOSIS — K869 Disease of pancreas, unspecified: Secondary | ICD-10-CM | POA: Diagnosis not present

## 2015-03-07 DIAGNOSIS — R490 Dysphonia: Secondary | ICD-10-CM | POA: Diagnosis not present

## 2015-03-07 DIAGNOSIS — R748 Abnormal levels of other serum enzymes: Secondary | ICD-10-CM | POA: Diagnosis not present

## 2015-03-07 DIAGNOSIS — E119 Type 2 diabetes mellitus without complications: Secondary | ICD-10-CM | POA: Diagnosis not present

## 2015-03-07 DIAGNOSIS — D692 Other nonthrombocytopenic purpura: Secondary | ICD-10-CM | POA: Diagnosis not present

## 2015-03-07 DIAGNOSIS — Z6829 Body mass index (BMI) 29.0-29.9, adult: Secondary | ICD-10-CM | POA: Diagnosis not present

## 2015-03-07 DIAGNOSIS — E784 Other hyperlipidemia: Secondary | ICD-10-CM | POA: Diagnosis not present

## 2015-03-07 DIAGNOSIS — Z1389 Encounter for screening for other disorder: Secondary | ICD-10-CM | POA: Diagnosis not present

## 2015-05-17 DIAGNOSIS — R970 Elevated carcinoembryonic antigen [CEA]: Secondary | ICD-10-CM | POA: Diagnosis not present

## 2015-05-17 DIAGNOSIS — K769 Liver disease, unspecified: Secondary | ICD-10-CM | POA: Diagnosis not present

## 2015-05-17 DIAGNOSIS — Z90411 Acquired partial absence of pancreas: Secondary | ICD-10-CM | POA: Diagnosis not present

## 2015-05-17 DIAGNOSIS — N400 Enlarged prostate without lower urinary tract symptoms: Secondary | ICD-10-CM | POA: Diagnosis not present

## 2015-05-17 DIAGNOSIS — Z79899 Other long term (current) drug therapy: Secondary | ICD-10-CM | POA: Diagnosis not present

## 2015-05-17 DIAGNOSIS — K760 Fatty (change of) liver, not elsewhere classified: Secondary | ICD-10-CM | POA: Diagnosis not present

## 2015-05-17 DIAGNOSIS — Z9049 Acquired absence of other specified parts of digestive tract: Secondary | ICD-10-CM | POA: Diagnosis not present

## 2015-06-08 DIAGNOSIS — W57XXXA Bitten or stung by nonvenomous insect and other nonvenomous arthropods, initial encounter: Secondary | ICD-10-CM | POA: Diagnosis not present

## 2015-06-08 DIAGNOSIS — Z6830 Body mass index (BMI) 30.0-30.9, adult: Secondary | ICD-10-CM | POA: Diagnosis not present

## 2015-06-08 DIAGNOSIS — L0889 Other specified local infections of the skin and subcutaneous tissue: Secondary | ICD-10-CM | POA: Diagnosis not present

## 2015-07-13 DIAGNOSIS — D692 Other nonthrombocytopenic purpura: Secondary | ICD-10-CM | POA: Diagnosis not present

## 2015-07-13 DIAGNOSIS — R600 Localized edema: Secondary | ICD-10-CM | POA: Diagnosis not present

## 2015-07-13 DIAGNOSIS — M7989 Other specified soft tissue disorders: Secondary | ICD-10-CM | POA: Diagnosis not present

## 2015-07-13 DIAGNOSIS — L0889 Other specified local infections of the skin and subcutaneous tissue: Secondary | ICD-10-CM | POA: Diagnosis not present

## 2015-07-13 DIAGNOSIS — M799 Soft tissue disorder, unspecified: Secondary | ICD-10-CM | POA: Insufficient documentation

## 2015-07-13 DIAGNOSIS — E663 Overweight: Secondary | ICD-10-CM | POA: Diagnosis not present

## 2015-07-13 DIAGNOSIS — K869 Disease of pancreas, unspecified: Secondary | ICD-10-CM | POA: Diagnosis not present

## 2015-07-13 DIAGNOSIS — E784 Other hyperlipidemia: Secondary | ICD-10-CM | POA: Diagnosis not present

## 2015-07-13 DIAGNOSIS — Z23 Encounter for immunization: Secondary | ICD-10-CM | POA: Diagnosis not present

## 2015-07-13 DIAGNOSIS — R748 Abnormal levels of other serum enzymes: Secondary | ICD-10-CM | POA: Diagnosis not present

## 2015-07-13 DIAGNOSIS — N200 Calculus of kidney: Secondary | ICD-10-CM | POA: Diagnosis not present

## 2015-07-13 DIAGNOSIS — K219 Gastro-esophageal reflux disease without esophagitis: Secondary | ICD-10-CM | POA: Diagnosis not present

## 2015-07-13 DIAGNOSIS — E119 Type 2 diabetes mellitus without complications: Secondary | ICD-10-CM | POA: Diagnosis not present

## 2015-07-19 DIAGNOSIS — R600 Localized edema: Secondary | ICD-10-CM | POA: Diagnosis not present

## 2015-07-19 DIAGNOSIS — L821 Other seborrheic keratosis: Secondary | ICD-10-CM | POA: Diagnosis not present

## 2015-07-19 DIAGNOSIS — L578 Other skin changes due to chronic exposure to nonionizing radiation: Secondary | ICD-10-CM | POA: Diagnosis not present

## 2015-07-19 DIAGNOSIS — L570 Actinic keratosis: Secondary | ICD-10-CM | POA: Diagnosis not present

## 2015-10-27 ENCOUNTER — Encounter: Payer: Self-pay | Admitting: Internal Medicine

## 2015-10-27 DIAGNOSIS — E119 Type 2 diabetes mellitus without complications: Secondary | ICD-10-CM | POA: Diagnosis not present

## 2015-10-27 DIAGNOSIS — Z125 Encounter for screening for malignant neoplasm of prostate: Secondary | ICD-10-CM | POA: Diagnosis not present

## 2015-10-27 DIAGNOSIS — R8290 Unspecified abnormal findings in urine: Secondary | ICD-10-CM | POA: Diagnosis not present

## 2015-10-27 DIAGNOSIS — N390 Urinary tract infection, site not specified: Secondary | ICD-10-CM | POA: Diagnosis not present

## 2015-10-27 DIAGNOSIS — E784 Other hyperlipidemia: Secondary | ICD-10-CM | POA: Diagnosis not present

## 2015-11-02 DIAGNOSIS — R748 Abnormal levels of other serum enzymes: Secondary | ICD-10-CM | POA: Diagnosis not present

## 2015-11-02 DIAGNOSIS — K760 Fatty (change of) liver, not elsewhere classified: Secondary | ICD-10-CM | POA: Insufficient documentation

## 2015-11-02 DIAGNOSIS — Z23 Encounter for immunization: Secondary | ICD-10-CM | POA: Diagnosis not present

## 2015-11-02 DIAGNOSIS — D692 Other nonthrombocytopenic purpura: Secondary | ICD-10-CM | POA: Diagnosis not present

## 2015-11-02 DIAGNOSIS — Z6829 Body mass index (BMI) 29.0-29.9, adult: Secondary | ICD-10-CM | POA: Diagnosis not present

## 2015-11-02 DIAGNOSIS — Z0000 Encounter for general adult medical examination without abnormal findings: Secondary | ICD-10-CM | POA: Diagnosis not present

## 2015-11-02 DIAGNOSIS — E784 Other hyperlipidemia: Secondary | ICD-10-CM | POA: Diagnosis not present

## 2015-11-02 DIAGNOSIS — Z1389 Encounter for screening for other disorder: Secondary | ICD-10-CM | POA: Diagnosis not present

## 2015-11-02 DIAGNOSIS — K869 Disease of pancreas, unspecified: Secondary | ICD-10-CM | POA: Diagnosis not present

## 2015-11-02 DIAGNOSIS — E119 Type 2 diabetes mellitus without complications: Secondary | ICD-10-CM | POA: Diagnosis not present

## 2015-11-02 DIAGNOSIS — N200 Calculus of kidney: Secondary | ICD-10-CM | POA: Diagnosis not present

## 2015-11-02 DIAGNOSIS — E663 Overweight: Secondary | ICD-10-CM | POA: Diagnosis not present

## 2015-11-07 ENCOUNTER — Encounter: Payer: Self-pay | Admitting: Internal Medicine

## 2015-11-07 DIAGNOSIS — Z1212 Encounter for screening for malignant neoplasm of rectum: Secondary | ICD-10-CM | POA: Diagnosis not present

## 2015-11-07 LAB — IFOBT (OCCULT BLOOD): IFOBT: POSITIVE

## 2015-11-21 DIAGNOSIS — L300 Nummular dermatitis: Secondary | ICD-10-CM | POA: Diagnosis not present

## 2015-11-21 DIAGNOSIS — L299 Pruritus, unspecified: Secondary | ICD-10-CM | POA: Diagnosis not present

## 2015-11-22 ENCOUNTER — Encounter: Payer: Self-pay | Admitting: Internal Medicine

## 2015-11-22 DIAGNOSIS — Z1212 Encounter for screening for malignant neoplasm of rectum: Secondary | ICD-10-CM | POA: Diagnosis not present

## 2015-11-22 LAB — IFOBT (OCCULT BLOOD): IFOBT: POSITIVE

## 2016-01-02 HISTORY — PX: COLONOSCOPY W/ POLYPECTOMY: SHX1380

## 2016-01-26 ENCOUNTER — Encounter: Payer: Self-pay | Admitting: Gastroenterology

## 2016-02-01 ENCOUNTER — Ambulatory Visit (INDEPENDENT_AMBULATORY_CARE_PROVIDER_SITE_OTHER): Payer: PPO | Admitting: Internal Medicine

## 2016-02-01 ENCOUNTER — Encounter: Payer: Self-pay | Admitting: Internal Medicine

## 2016-02-01 VITALS — BP 130/80 | HR 80 | Ht 68.0 in | Wt 172.0 lb

## 2016-02-01 DIAGNOSIS — R195 Other fecal abnormalities: Secondary | ICD-10-CM | POA: Diagnosis not present

## 2016-02-01 MED ORDER — SOD PICOSULFATE-MAG OX-CIT ACD 10-3.5-12 MG-GM-GM PO PACK: PACK | ORAL | 0 refills | Status: DC

## 2016-02-01 NOTE — Progress Notes (Signed)
Francisco [name] 83 y.o. 1933/05/05 [id number]  Assessment & Plan:   Encounter Diagnosis  Name Primary?  . Heme + stool - hemosure Yes    Colonoscopy to evaluate The risks and benefits as well as alternatives of endoscopic procedure(s) have been discussed and reviewed. All questions answered. The patient agrees to proceed. Prep-o-pik lower volume prep given difficulty drinking larger volumes of liquids  I appreciate the opportunity to care for this patient. [id number] A, MD     Subjective:   Chief Complaint: heme + stool  HPI  Here w/ wife Hemosure + 2 x in November No rectal bleeding, change in bowels or abdominal pain. Some heartburn, bloating and gas. S/p Whipple for IPMN lesion 2014 [facility] and since then difficulty drking larger volumes of liquid and concerned about that re: prep for colonoscopy Also quite worried about colonoscopy because he knows 2 people that have died related to colonoscopy  GI ROs otherwise negative except mild heartburn occasionally and some bloating and flatulence.  No Known Allergies Current Meds  Medication Sig  . ACCU-CHEK FASTCLIX LANCETS MISC by Does not apply route daily.  Marland Kitchen aspirin 81 MG tablet Take 81 mg by mouth daily.  . cefdinir (OMNICEF) 300 MG capsule Take 1 capsule by mouth 2 (two) times daily.  Marland Kitchen desonide (DESOWEN) 0.05 % cream Apply topically 2 (two) times daily.  Marland Kitchen desoximetasone (TOPICORT) 0.25 % cream Apply 1 application topically as needed.   . finasteride (PROSCAR) 5 MG tablet Take 5 mg by mouth daily.  Marland Kitchen glucose blood (ACCU-CHEK SMARTVIEW) test strip 1 each by Other route daily. Use as instructed  . loratadine (CLARITIN) 10 MG tablet Take 10 mg by mouth daily.  . metFORMIN (GLUCOPHAGE) 500 MG tablet Take 500 mg by mouth 2 (two) times daily with a meal.  . Multiple Vitamin (MULTIVITAMIN) tablet Take 1 tablet by mouth daily.  . naproxen sodium (ANAPROX) 220 MG tablet Take 220 mg by mouth continuous as needed  (for pain).  . pantoprazole (PROTONIX) 40 MG tablet Take 40 mg by mouth daily.  . Probiotic Product (ALIGN) 4 MG CAPS Take 1 capsule by mouth daily.  . tamsulosin (FLOMAX) 0.4 MG CAPS capsule Take 0.4 mg by mouth 2 (two) times daily.  Marland Kitchen triamcinolone cream (KENALOG) 0.1 % Apply topically 2 (two) times daily.   Past Medical History:  Diagnosis Date  . Asthma with allergic rhinitis   . DM (diabetes mellitus) ([place])   . Elevated prostate specific antigen (PSA)   . Fatty liver   . GERD (gastroesophageal reflux disease)   . Hyperlipidemia   . Impotence of organic origin   . Neoplasm of uncertain behavior of prostate   . Pancreatic cyst FOLLOWED BY DR JACOBS  . Personal history of urinary calculi   . Right ureteral stone    Past Surgical History:  Procedure Laterality Date  . CYSTOSCOPY WITH URETEROSCOPY  03/14/2011   Procedure: CYSTOSCOPY WITH URETEROSCOPY;  Surgeon: Molli Hazard, MD;  Location: [facility];  Service: Urology;  Laterality: Right;  2 hours requested for this case  Right Ureter Stent Placement  C-ARM CAMERA DIGITAL URETEROSCOPE    . EUS  04/12/2011   Procedure: UPPER ENDOSCOPIC ULTRASOUND (EUS) LINEAR;  Surgeon: Milus Banister, MD;  Location: [facility];  Service: Endoscopy;  Laterality: N/A;  radial linear/pt moved up a hour early by AW , Patty @ [place] office ok'd & pt was called by AW  . KIDNEY STONE SURGERY  March  2013  . PERCUTANEOUS [place]  . WHIPPLE PROCEDURE  12/2011   [facility]   Family History  Problem Relation Age of Onset  . Diabetes Mother   . Diabetes Sister   . Cancer Brother     unknown type  . Stroke Father    Social History   Social History  . Marital status: Married    Spouse name: N/A  . Number of children: 1  . Years of education: N/A   Occupational History  . auctioneer/retierd    Social History Main Topics  . Smoking status: Former Smoker    Years: 20.00    Types: Cigarettes    Quit  date: 04/11/1991  . Smokeless tobacco: Never Used  . Alcohol use 0.6 oz/week    1 Cans of beer per week  . Drug use: No  . Sexual activity: Not Asked   Other Topics Concern  . 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 Lungs: Clear to auscultation bilaterally. Heart:  S1S2, no rubs, murmurs, gallops. Rectal: Deferred to colonoscopy Lymph:  no cervical or supraclavicular adenopathy. Skin   senile purpura. Neuro:  A&O x 3.  Psych:  appropriate mood and  Affect.   Data Reviewed:  PCP notes/labs Hgb NL 10/2015

## 2016-02-01 NOTE — Patient Instructions (Signed)
You have been scheduled for a colonoscopy. Please follow written instructions given to you at your visit today.  Please pick up your prep supplies at the pharmacy within the next 1-3 days. If you use inhalers (even only as needed), please bring them with you on the day of your procedure.   I appreciate the opportunity to care for you. Carl Gessner, MD, FACG 

## 2016-02-07 ENCOUNTER — Encounter: Payer: Self-pay | Admitting: Internal Medicine

## 2016-02-07 ENCOUNTER — Ambulatory Visit (AMBULATORY_SURGERY_CENTER): Payer: PPO | Admitting: Internal Medicine

## 2016-02-07 VITALS — BP 127/79 | HR 79 | Temp 98.0°F | Resp 17 | Ht 68.0 in | Wt 158.0 lb

## 2016-02-07 DIAGNOSIS — D122 Benign neoplasm of ascending colon: Secondary | ICD-10-CM | POA: Diagnosis not present

## 2016-02-07 DIAGNOSIS — K219 Gastro-esophageal reflux disease without esophagitis: Secondary | ICD-10-CM | POA: Diagnosis not present

## 2016-02-07 DIAGNOSIS — D125 Benign neoplasm of sigmoid colon: Secondary | ICD-10-CM

## 2016-02-07 DIAGNOSIS — D124 Benign neoplasm of descending colon: Secondary | ICD-10-CM

## 2016-02-07 DIAGNOSIS — R195 Other fecal abnormalities: Secondary | ICD-10-CM

## 2016-02-07 DIAGNOSIS — E119 Type 2 diabetes mellitus without complications: Secondary | ICD-10-CM | POA: Diagnosis not present

## 2016-02-07 DIAGNOSIS — D123 Benign neoplasm of transverse colon: Secondary | ICD-10-CM

## 2016-02-07 DIAGNOSIS — K635 Polyp of colon: Secondary | ICD-10-CM

## 2016-02-07 MED ORDER — SODIUM CHLORIDE 0.9 % IV SOLN: 500.0000 mL | INTRAVENOUS | Status: DC

## 2016-02-07 NOTE — Progress Notes (Signed)
1350 EKG showing bradycardia with rates of 30s to 40, Dr Carlean Purl updated with scope pulled back.  Robinal given IV, vss

## 2016-02-07 NOTE — Progress Notes (Signed)
Called to room to assist during endoscopic procedure.  Patient ID and intended procedure confirmed with present staff. Received instructions for my participation in the procedure from the performing physician.  

## 2016-02-07 NOTE — Progress Notes (Signed)
Report given to PACU, vss 

## 2016-02-07 NOTE — Op Note (Signed)
[facility] Patient Name: Francisco [name] Procedure Date: 02/07/2016 1:26 PM MRN: [id number] Endoscopist: Gatha Mayer , MD Age: 83 Referring MD:  Date of Birth: [birth year] Gender: Male Account #: [id number] Procedure:                Colonoscopy Indications:              Heme positive stool Medicines:                Propofol per Anesthesia, Monitored Anesthesia Care Procedure:                Pre-Anesthesia Assessment:                           - Prior to the procedure, a History and Physical                            was performed, and patient medications and                            allergies were reviewed. The patient's tolerance of                            previous anesthesia was also reviewed. The risks                            and benefits of the procedure and the sedation                            options and risks were discussed with the patient.                            All questions were answered, and informed consent                            was obtained. Prior Anticoagulants: The patient                            last took aspirin 1 day prior to the procedure. ASA                            Grade Assessment: III - A patient with severe                            systemic disease. After reviewing the risks and                            benefits, the patient was deemed in satisfactory                            condition to undergo the procedure.                           After obtaining informed consent, the colonoscope  was passed under direct vision. Throughout the                            procedure, the patient's blood pressure, pulse, and                            oxygen saturations were monitored continuously. The                            Model CF-HQ190L [id number]) scope was introduced                            through the anus and advanced to the the cecum,                            identified by appendiceal  orifice and ileocecal                            valve. The colonoscopy was performed without                            difficulty. The patient tolerated the procedure                            fairly well. The quality of the bowel preparation                            was good. The bowel preparation used was Prepopik.                            The ileocecal valve, appendiceal orifice, and                            rectum were photographed. Scope In: 1:49:04 PM Scope Out: 2:16:47 PM Scope Withdrawal Time: 0 hours 22 minutes 47 seconds  Total Procedure Duration: 0 hours 27 minutes 43 seconds  Findings:                 The perianal examination was normal.                           The digital rectal exam findings include enlarged                            prostate. Pertinent negatives include no palpable                            rectal lesions.                           Ten pedunculated and sessile and semi-pedunculated                            polyps were found in the sigmoid colon, descending  colon, transverse colon and ascending colon. The                            polyps were 4 to 15 mm in size. These polyps were                            removed with a hot and cold snares. Resection and                            retrieval were complete.                           Internal hemorrhoids were found during retroflexion.                           The exam was otherwise without abnormality on                            direct and retroflexion views. Complications:            No immediate complications. Estimated Blood Loss:     Estimated blood loss was minimal. Impression:               - Enlarged prostate found on digital rectal exam.                           - Ten 4 to 15 mm polyps in the sigmoid colon, in                            the descending colon, in the transverse colon and                            in the ascending colon, removed with a hot  snare.                            Resected and retrieved.                           - Internal hemorrhoids.                           - The examination was otherwise normal on direct                            and retroflexion views. Recommendation:           - Patient has a contact number available for                            emergencies. The signs and symptoms of potential                            delayed complications were discussed with the  patient. Return to normal activities tomorrow.                            Written discharge instructions were provided to the                            patient.                           - No aspirin, ibuprofen, naproxen, or other                            non-steroidal anti-inflammatory drugs for 2 weeks                            after polyp removal.                           - No repeat colonoscopy due to age. unless                            pathology indicates otherwise                           - Resume previous diet. Gatha Mayer, MD 02/07/2016 2:32:08 PM This report has been signed electronically.

## 2016-02-07 NOTE — Progress Notes (Signed)
Pt's states no medical or surgical changes since previsit or office visit. 

## 2016-02-07 NOTE — Patient Instructions (Addendum)
I found and removed 10 polyps - all looked benign but had potential to become cancer. Also have hemorrhoids inside.  I will let you know pathology results by mail.  I appreciate the opportunity to care for you. Gatha Mayer, MD, [facility]  Impression/Recommendations:  Polyp handout given to patient. Hemorrhoid handout given to patient.  No aspirin, ibuprofen, naproxen, or other NSAIDs for 2 weeks.  Tylenol only until Feb. 21, 2018.  YOU HAD AN ENDOSCOPIC PROCEDURE TODAY AT [facility]:   Refer to the procedure report that was given to you for any specific questions about what was found during the examination.  If the procedure report does not answer your questions, please call your gastroenterologist to clarify.  If you requested that your care partner not be given the details of your procedure findings, then the procedure report has been included in a sealed envelope for you to review at your convenience later.  YOU SHOULD EXPECT: Some feelings of bloating in the abdomen. Passage of more gas than usual.  Walking can help get rid of the air that was put into your GI tract during the procedure and reduce the bloating. If you had a lower endoscopy (such as a colonoscopy or flexible sigmoidoscopy) you may notice spotting of blood in your stool or on the toilet paper. If you underwent a bowel prep for your procedure, you may not have a normal bowel movement for a few days.  Please Note:  You might notice some irritation and congestion in your nose or some drainage.  This is from the oxygen used during your procedure.  There is no need for concern and it should clear up in a day or so.  SYMPTOMS TO REPORT IMMEDIATELY:   Following lower endoscopy (colonoscopy or flexible sigmoidoscopy):  Excessive amounts of blood in the stool  Significant tenderness or worsening of abdominal pains  Swelling of the abdomen that is new, acute  Fever of 100F or higher For urgent or  emergent issues, a gastroenterologist can be reached at any hour by calling [phone].   DIET:  We do recommend a small meal at first, but then you may proceed to your regular diet.  Drink plenty of fluids but you should avoid alcoholic beverages for 24 hours.  ACTIVITY:  You should plan to take it easy for the rest of today and you should NOT DRIVE or use heavy machinery until tomorrow (because of the sedation medicines used during the test).    FOLLOW UP: Our staff will call the number listed on your records the next business day following your procedure to check on you and address any questions or concerns that you may have regarding the information given to you following your procedure. If we do not reach you, we will leave a message.  However, if you are feeling well and you are not experiencing any problems, there is no need to return our call.  We will assume that you have returned to your regular daily activities without incident.  If any biopsies were taken you will be contacted by phone or by letter within the next 1-3 weeks.  Please call us at [phone] if you have not heard about the biopsies in 3 weeks.    SIGNATURES/CONFIDENTIALITY: You and/or your care partner have signed paperwork which will be entered into your electronic medical record.  These signatures attest to the fact that that the information above on your After Visit Summary has been reviewed  and is understood.  Full responsibility of the confidentiality of this discharge information lies with you and/or your care-partner. 

## 2016-02-08 ENCOUNTER — Telehealth: Payer: Self-pay

## 2016-02-08 NOTE — Telephone Encounter (Signed)
  Follow up Call-  Call back number 02/07/2016  Post procedure Call Back phone  # [phone]  Permission to leave phone message Yes  Some recent data might be hidden     Patient questions:  Do you have a fever, pain , or abdominal swelling? No. Pain Score  0 *  Have you tolerated food without any problems? Yes.    Have you been able to return to your normal activities? Yes.    Do you have any questions about your discharge instructions: Diet   No. Medications  No. Follow up visit  No.  Do you have questions or concerns about your Care? No.  Actions: * If pain score is 4 or above: No action needed, pain <4.

## 2016-02-14 ENCOUNTER — Encounter: Payer: Self-pay | Admitting: Internal Medicine

## 2016-02-14 DIAGNOSIS — Z860101 Personal history of adenomatous and serrated colon polyps: Secondary | ICD-10-CM

## 2016-02-14 DIAGNOSIS — Z86010 Personal history of colonic polyps: Secondary | ICD-10-CM

## 2016-02-14 HISTORY — DX: Personal history of adenomatous and serrated colon polyps: Z86.0101

## 2016-02-14 HISTORY — DX: Personal history of colonic polyps: Z86.010

## 2016-02-14 NOTE — Progress Notes (Signed)
10 adenomas/tv adenomas No recall age

## 2016-03-06 DIAGNOSIS — N401 Enlarged prostate with lower urinary tract symptoms: Secondary | ICD-10-CM | POA: Diagnosis not present

## 2016-03-06 DIAGNOSIS — N5201 Erectile dysfunction due to arterial insufficiency: Secondary | ICD-10-CM | POA: Diagnosis not present

## 2016-03-06 DIAGNOSIS — R3915 Urgency of urination: Secondary | ICD-10-CM | POA: Diagnosis not present

## 2016-03-06 DIAGNOSIS — N200 Calculus of kidney: Secondary | ICD-10-CM | POA: Diagnosis not present

## 2016-03-06 DIAGNOSIS — N281 Cyst of kidney, acquired: Secondary | ICD-10-CM | POA: Diagnosis not present

## 2016-05-15 DIAGNOSIS — Z09 Encounter for follow-up examination after completed treatment for conditions other than malignant neoplasm: Secondary | ICD-10-CM | POA: Diagnosis not present

## 2016-05-15 DIAGNOSIS — K7689 Other specified diseases of liver: Secondary | ICD-10-CM | POA: Diagnosis not present

## 2016-05-15 DIAGNOSIS — K7290 Hepatic failure, unspecified without coma: Secondary | ICD-10-CM | POA: Diagnosis not present

## 2016-05-15 DIAGNOSIS — D490 Neoplasm of unspecified behavior of digestive system: Secondary | ICD-10-CM | POA: Diagnosis not present

## 2016-05-15 DIAGNOSIS — Z86018 Personal history of other benign neoplasm: Secondary | ICD-10-CM | POA: Diagnosis not present

## 2016-05-15 DIAGNOSIS — K769 Liver disease, unspecified: Secondary | ICD-10-CM | POA: Diagnosis not present

## 2016-05-15 DIAGNOSIS — R935 Abnormal findings on diagnostic imaging of other abdominal regions, including retroperitoneum: Secondary | ICD-10-CM | POA: Diagnosis not present

## 2016-05-15 DIAGNOSIS — R918 Other nonspecific abnormal finding of lung field: Secondary | ICD-10-CM | POA: Diagnosis not present

## 2016-05-15 DIAGNOSIS — Z90411 Acquired partial absence of pancreas: Secondary | ICD-10-CM | POA: Diagnosis not present

## 2016-05-15 DIAGNOSIS — Z87891 Personal history of nicotine dependence: Secondary | ICD-10-CM | POA: Diagnosis not present

## 2016-05-16 DIAGNOSIS — R748 Abnormal levels of other serum enzymes: Secondary | ICD-10-CM | POA: Diagnosis not present

## 2016-05-16 DIAGNOSIS — D692 Other nonthrombocytopenic purpura: Secondary | ICD-10-CM | POA: Diagnosis not present

## 2016-05-16 DIAGNOSIS — Z6828 Body mass index (BMI) 28.0-28.9, adult: Secondary | ICD-10-CM | POA: Diagnosis not present

## 2016-05-16 DIAGNOSIS — E663 Overweight: Secondary | ICD-10-CM | POA: Diagnosis not present

## 2016-05-16 DIAGNOSIS — K760 Fatty (change of) liver, not elsewhere classified: Secondary | ICD-10-CM | POA: Diagnosis not present

## 2016-05-16 DIAGNOSIS — M7989 Other specified soft tissue disorders: Secondary | ICD-10-CM | POA: Diagnosis not present

## 2016-05-16 DIAGNOSIS — Z1389 Encounter for screening for other disorder: Secondary | ICD-10-CM | POA: Diagnosis not present

## 2016-05-16 DIAGNOSIS — E119 Type 2 diabetes mellitus without complications: Secondary | ICD-10-CM | POA: Diagnosis not present

## 2016-05-16 DIAGNOSIS — E784 Other hyperlipidemia: Secondary | ICD-10-CM | POA: Diagnosis not present

## 2016-05-16 DIAGNOSIS — N200 Calculus of kidney: Secondary | ICD-10-CM | POA: Diagnosis not present

## 2016-05-16 DIAGNOSIS — K219 Gastro-esophageal reflux disease without esophagitis: Secondary | ICD-10-CM | POA: Diagnosis not present

## 2016-05-16 DIAGNOSIS — K869 Disease of pancreas, unspecified: Secondary | ICD-10-CM | POA: Diagnosis not present

## 2016-05-21 ENCOUNTER — Telehealth: Payer: Self-pay | Admitting: Internal Medicine

## 2016-05-22 NOTE — Telephone Encounter (Signed)
I can see him (and many others tomorrow)  My schedule got changed to AM and the blocks put on when it was in PM by mistake were left in  Open it up please

## 2016-05-22 NOTE — Telephone Encounter (Signed)
Patient notified he will come in tomorrow at 8:45

## 2016-05-22 NOTE — Telephone Encounter (Signed)
Patient reports that he is having intermittent blood in the stool. He has had 3 times in the last week. He is not sure of the color bright red or dark.  Please advise

## 2016-05-23 ENCOUNTER — Encounter: Payer: Self-pay | Admitting: Internal Medicine

## 2016-05-23 ENCOUNTER — Ambulatory Visit (INDEPENDENT_AMBULATORY_CARE_PROVIDER_SITE_OTHER): Payer: PPO | Admitting: Internal Medicine

## 2016-05-23 VITALS — BP 124/66 | HR 68 | Ht 68.0 in | Wt 171.0 lb

## 2016-05-23 DIAGNOSIS — K644 Residual hemorrhoidal skin tags: Secondary | ICD-10-CM | POA: Diagnosis not present

## 2016-05-23 DIAGNOSIS — K648 Other hemorrhoids: Secondary | ICD-10-CM

## 2016-05-23 NOTE — Patient Instructions (Signed)
You can use preparation H as needed.   Please call our office back for hemorrhoid banding appointment.   Thank you for choosing Dr. Carlean Purl and [facility].

## 2016-05-23 NOTE — Progress Notes (Signed)
   Francisco [name] 83 y.o. 08-Sep-1933 [id number]  Assessment & Plan:   Encounter Diagnosis  Name Primary?  . Internal and external bleeding hemorrhoids Yes    Plan for treatment of internal hemorrhoids with banding at his convenience. Perp h prn til then  I appreciate the opportunity to care for this patient. MH:WKGSUPJ, [name], MD    Subjective:   Chief Complaint: rectal bleeding  HPI 83 yo wm w/ hx 10 colon polyps this year and hemorrhoids and diverticulosis on colonoscopy 02/2016 for hemosure + stool. Since then has has some intermittent bright red rectal bleeding, no pain. Has regular defecation w/o difficulty and says does not spend a long time on commode. Describes hemorrhoid prolapse sxs after colonoscopy - Tx prep H and gone but then bleeding on paper and into commode since then. Thinks prep H may help.  Medications, allergies, past medical history, past surgical history, family history and social history are reviewed and updated in the EMR.   Review of Systems Ok otherwise  Objective:   Physical Exam BP 124/66 (BP Location: Left Arm, Patient Position: Sitting, Cuff Size: Normal)   Pulse 68   Ht 5\' 8"  (1.727 m)   Wt 171 lb (77.6 kg)   BMI 26.00 kg/m  NAD  Rectal - small erythematous tags, mild spasm/stenosis but non-tender and no mass, brown stool  Anoscopy - Grade 2 inflamed internal hemorrhoids all positions with minor external components

## 2016-06-05 DIAGNOSIS — R911 Solitary pulmonary nodule: Secondary | ICD-10-CM | POA: Diagnosis not present

## 2016-06-05 DIAGNOSIS — R918 Other nonspecific abnormal finding of lung field: Secondary | ICD-10-CM | POA: Diagnosis not present

## 2016-07-23 DIAGNOSIS — L820 Inflamed seborrheic keratosis: Secondary | ICD-10-CM | POA: Diagnosis not present

## 2016-07-23 DIAGNOSIS — L821 Other seborrheic keratosis: Secondary | ICD-10-CM | POA: Diagnosis not present

## 2016-07-23 DIAGNOSIS — L570 Actinic keratosis: Secondary | ICD-10-CM | POA: Diagnosis not present

## 2016-07-23 DIAGNOSIS — L853 Xerosis cutis: Secondary | ICD-10-CM | POA: Diagnosis not present

## 2016-07-23 DIAGNOSIS — L578 Other skin changes due to chronic exposure to nonionizing radiation: Secondary | ICD-10-CM | POA: Diagnosis not present

## 2016-11-08 DIAGNOSIS — L299 Pruritus, unspecified: Secondary | ICD-10-CM | POA: Diagnosis not present

## 2016-11-08 DIAGNOSIS — L853 Xerosis cutis: Secondary | ICD-10-CM | POA: Diagnosis not present

## 2016-11-08 DIAGNOSIS — Z125 Encounter for screening for malignant neoplasm of prostate: Secondary | ICD-10-CM | POA: Diagnosis not present

## 2016-11-08 DIAGNOSIS — L300 Nummular dermatitis: Secondary | ICD-10-CM | POA: Diagnosis not present

## 2016-11-08 DIAGNOSIS — E119 Type 2 diabetes mellitus without complications: Secondary | ICD-10-CM | POA: Diagnosis not present

## 2016-11-08 DIAGNOSIS — R82998 Other abnormal findings in urine: Secondary | ICD-10-CM | POA: Diagnosis not present

## 2016-11-08 DIAGNOSIS — E7849 Other hyperlipidemia: Secondary | ICD-10-CM | POA: Diagnosis not present

## 2016-11-14 DIAGNOSIS — Z6826 Body mass index (BMI) 26.0-26.9, adult: Secondary | ICD-10-CM | POA: Diagnosis not present

## 2016-11-14 DIAGNOSIS — M7989 Other specified soft tissue disorders: Secondary | ICD-10-CM | POA: Diagnosis not present

## 2016-11-14 DIAGNOSIS — K760 Fatty (change of) liver, not elsewhere classified: Secondary | ICD-10-CM | POA: Diagnosis not present

## 2016-11-14 DIAGNOSIS — E663 Overweight: Secondary | ICD-10-CM | POA: Diagnosis not present

## 2016-11-14 DIAGNOSIS — E119 Type 2 diabetes mellitus without complications: Secondary | ICD-10-CM | POA: Diagnosis not present

## 2016-11-14 DIAGNOSIS — K869 Disease of pancreas, unspecified: Secondary | ICD-10-CM | POA: Diagnosis not present

## 2016-11-14 DIAGNOSIS — D692 Other nonthrombocytopenic purpura: Secondary | ICD-10-CM | POA: Diagnosis not present

## 2016-11-14 DIAGNOSIS — Z23 Encounter for immunization: Secondary | ICD-10-CM | POA: Diagnosis not present

## 2016-11-14 DIAGNOSIS — Z0000 Encounter for general adult medical examination without abnormal findings: Secondary | ICD-10-CM | POA: Diagnosis not present

## 2016-11-14 DIAGNOSIS — E7849 Other hyperlipidemia: Secondary | ICD-10-CM | POA: Diagnosis not present

## 2016-11-14 DIAGNOSIS — R748 Abnormal levels of other serum enzymes: Secondary | ICD-10-CM | POA: Diagnosis not present

## 2016-11-14 DIAGNOSIS — K219 Gastro-esophageal reflux disease without esophagitis: Secondary | ICD-10-CM | POA: Diagnosis not present

## 2016-11-26 DIAGNOSIS — Z1212 Encounter for screening for malignant neoplasm of rectum: Secondary | ICD-10-CM | POA: Diagnosis not present

## 2017-01-21 DIAGNOSIS — L578 Other skin changes due to chronic exposure to nonionizing radiation: Secondary | ICD-10-CM | POA: Diagnosis not present

## 2017-01-21 DIAGNOSIS — L570 Actinic keratosis: Secondary | ICD-10-CM | POA: Diagnosis not present

## 2017-01-21 DIAGNOSIS — L821 Other seborrheic keratosis: Secondary | ICD-10-CM | POA: Diagnosis not present

## 2017-01-21 DIAGNOSIS — L300 Nummular dermatitis: Secondary | ICD-10-CM | POA: Diagnosis not present

## 2017-03-12 DIAGNOSIS — R918 Other nonspecific abnormal finding of lung field: Secondary | ICD-10-CM | POA: Diagnosis not present

## 2017-03-12 DIAGNOSIS — D490 Neoplasm of unspecified behavior of digestive system: Secondary | ICD-10-CM | POA: Diagnosis not present

## 2017-03-12 DIAGNOSIS — K7689 Other specified diseases of liver: Secondary | ICD-10-CM | POA: Diagnosis not present

## 2017-03-12 DIAGNOSIS — Z86010 Personal history of colonic polyps: Secondary | ICD-10-CM | POA: Diagnosis not present

## 2017-03-12 DIAGNOSIS — D649 Anemia, unspecified: Secondary | ICD-10-CM | POA: Diagnosis not present

## 2017-03-12 DIAGNOSIS — Z9889 Other specified postprocedural states: Secondary | ICD-10-CM | POA: Diagnosis not present

## 2017-03-12 DIAGNOSIS — R1084 Generalized abdominal pain: Secondary | ICD-10-CM | POA: Diagnosis not present

## 2017-03-12 DIAGNOSIS — R970 Elevated carcinoembryonic antigen [CEA]: Secondary | ICD-10-CM | POA: Diagnosis not present

## 2017-03-12 DIAGNOSIS — K769 Liver disease, unspecified: Secondary | ICD-10-CM | POA: Diagnosis not present

## 2017-03-12 DIAGNOSIS — Z87891 Personal history of nicotine dependence: Secondary | ICD-10-CM | POA: Diagnosis not present

## 2017-03-12 DIAGNOSIS — K625 Hemorrhage of anus and rectum: Secondary | ICD-10-CM | POA: Diagnosis not present

## 2017-03-12 DIAGNOSIS — R9389 Abnormal findings on diagnostic imaging of other specified body structures: Secondary | ICD-10-CM | POA: Diagnosis not present

## 2017-03-12 DIAGNOSIS — R159 Full incontinence of feces: Secondary | ICD-10-CM | POA: Diagnosis not present

## 2017-03-12 DIAGNOSIS — K5900 Constipation, unspecified: Secondary | ICD-10-CM | POA: Diagnosis not present

## 2017-04-08 DIAGNOSIS — L600 Ingrowing nail: Secondary | ICD-10-CM | POA: Diagnosis not present

## 2017-04-08 DIAGNOSIS — K219 Gastro-esophageal reflux disease without esophagitis: Secondary | ICD-10-CM | POA: Diagnosis not present

## 2017-04-08 DIAGNOSIS — Z1389 Encounter for screening for other disorder: Secondary | ICD-10-CM | POA: Diagnosis not present

## 2017-04-08 DIAGNOSIS — E663 Overweight: Secondary | ICD-10-CM | POA: Diagnosis not present

## 2017-04-08 DIAGNOSIS — Z87442 Personal history of urinary calculi: Secondary | ICD-10-CM | POA: Diagnosis not present

## 2017-04-08 DIAGNOSIS — R748 Abnormal levels of other serum enzymes: Secondary | ICD-10-CM | POA: Diagnosis not present

## 2017-04-08 DIAGNOSIS — R970 Elevated carcinoembryonic antigen [CEA]: Secondary | ICD-10-CM | POA: Diagnosis not present

## 2017-04-08 DIAGNOSIS — K760 Fatty (change of) liver, not elsewhere classified: Secondary | ICD-10-CM | POA: Diagnosis not present

## 2017-04-08 DIAGNOSIS — E7849 Other hyperlipidemia: Secondary | ICD-10-CM | POA: Diagnosis not present

## 2017-04-08 DIAGNOSIS — E119 Type 2 diabetes mellitus without complications: Secondary | ICD-10-CM | POA: Diagnosis not present

## 2017-04-08 DIAGNOSIS — D692 Other nonthrombocytopenic purpura: Secondary | ICD-10-CM | POA: Diagnosis not present

## 2017-04-08 DIAGNOSIS — K625 Hemorrhage of anus and rectum: Secondary | ICD-10-CM | POA: Diagnosis not present

## 2017-04-29 ENCOUNTER — Ambulatory Visit: Payer: PPO | Admitting: Podiatry

## 2017-04-29 ENCOUNTER — Encounter: Payer: Self-pay | Admitting: Podiatry

## 2017-04-29 DIAGNOSIS — M79675 Pain in left toe(s): Secondary | ICD-10-CM

## 2017-04-29 DIAGNOSIS — B351 Tinea unguium: Secondary | ICD-10-CM

## 2017-04-29 DIAGNOSIS — M79674 Pain in right toe(s): Secondary | ICD-10-CM | POA: Diagnosis not present

## 2017-04-29 DIAGNOSIS — Q828 Other specified congenital malformations of skin: Secondary | ICD-10-CM | POA: Diagnosis not present

## 2017-04-29 NOTE — Progress Notes (Signed)
Subjective:   Patient ID: Francisco [name], male   DOB: 84 y.o.   MRN: [id number]   HPI 84 year old male presents the office with concerns of thick, painful, elongated toenails that he cannot trim himself.  He was getting pedicures but the skin was going up to the toenail they were not comfortable doing this.  He states that he gets painful with pressure in shoes.  Denies any redness or drainage or any swelling.  He also has a mild callus to the left foot and the ball of the foot which he has been trying to get off.  Denies any other wounds.  He has no other concerns.   Review of Systems  All other systems reviewed and are negative.  Past Medical History:  Diagnosis Date  . Asthma with allergic rhinitis   . DM (diabetes mellitus) ([place])   . Elevated prostate specific antigen (PSA)   . Fatty liver   . GERD (gastroesophageal reflux disease)   . Hx of adenomatous colonic polyps 02/14/2016   10 adenomas max 15 mm 02/2016 - no recall at his age  . Hyperlipidemia   . Impotence of organic origin   . Neoplasm of uncertain behavior of prostate   . Pancreatic cyst FOLLOWED BY DR JACOBS  . Personal history of urinary calculi   . Right ureteral stone     Past Surgical History:  Procedure Laterality Date  . CYSTOSCOPY WITH URETEROSCOPY  03/14/2011   Procedure: CYSTOSCOPY WITH URETEROSCOPY;  Surgeon: Molli Hazard, MD;  Location: [facility];  Service: Urology;  Laterality: Right;  2 hours requested for this case  Right Ureter Stent Placement  C-ARM CAMERA DIGITAL URETEROSCOPE    . EUS  04/12/2011   Procedure: UPPER ENDOSCOPIC ULTRASOUND (EUS) LINEAR;  Surgeon: Milus Banister, MD;  Location: [facility];  Service: Endoscopy;  Laterality: N/A;  radial linear/pt moved up a hour early by AW , Patty @ [place] office ok'd & pt was called by AW  . KIDNEY STONE SURGERY  March 2013  . PERCUTANEOUS NEPHROSTOLITHOTOMY  1985  . WHIPPLE PROCEDURE  12/2011   [facility]     Current  Outpatient Medications:  .  ACCU-CHEK FASTCLIX LANCETS MISC, by Does not apply route daily., Disp: , Rfl:  .  aspirin 81 MG tablet, Take 81 mg by mouth daily., Disp: , Rfl:  .  desonide (DESOWEN) 0.05 % cream, Apply topically 2 (two) times daily., Disp: , Rfl:  .  desoximetasone (TOPICORT) 0.25 % cream, Apply 1 application topically as needed. , Disp: , Rfl:  .  finasteride (PROSCAR) 5 MG tablet, Take 5 mg by mouth daily., Disp: , Rfl:  .  glucose blood (ACCU-CHEK SMARTVIEW) test strip, 1 each by Other route daily. Use as instructed, Disp: , Rfl:  .  loratadine (CLARITIN) 10 MG tablet, Take 10 mg by mouth daily., Disp: , Rfl:  .  metFORMIN (GLUCOPHAGE) 500 MG tablet, Take 500 mg by mouth 2 (two) times daily with a meal., Disp: , Rfl:  .  Multiple Vitamin (MULTIVITAMIN) tablet, Take 1 tablet by mouth daily., Disp: , Rfl:  .  naproxen sodium (ANAPROX) 220 MG tablet, Take 220 mg by mouth continuous as needed (for pain)., Disp: , Rfl:  .  pantoprazole (PROTONIX) 40 MG tablet, Take 40 mg by mouth daily., Disp: , Rfl:  .  Probiotic Product (ALIGN) 4 MG CAPS, Take 1 capsule by mouth daily., Disp: , Rfl:  .  tamsulosin (FLOMAX) 0.4 MG CAPS  capsule, Take 0.4 mg by mouth 2 (two) times daily., Disp: , Rfl:  .  triamcinolone cream (KENALOG) 0.1 %, Apply topically 2 (two) times daily., Disp: , Rfl:   No Known Allergies  Social History   Socioeconomic History  . Marital status: Married    Spouse name: Not on file  . Number of children: 1  . Years of education: Not on file  . Highest education level: Not on file  Occupational History  . Occupation: auctioneer/retierd  Social Needs  . Financial resource strain: Not on file  . Food insecurity:    Worry: Not on file    Inability: Not on file  . Transportation needs:    Medical: Not on file    Non-medical: Not on file  Tobacco Use  . Smoking status: Former Smoker    Years: 20.00    Types: Cigarettes    Last attempt to quit: 04/11/1991    Years  since quitting: 26.0  . Smokeless tobacco: Never Used  Substance and Sexual Activity  . Alcohol use: Yes    Alcohol/week: 0.6 oz    Types: 1 Cans of beer per week  . Drug use: No  . Sexual activity: Not on file  Lifestyle  . Physical activity:    Days per week: Not on file    Minutes per session: Not on file  . Stress: Not on file  Relationships  . Social connections:    Talks on phone: Not on file    Gets together: Not on file    Attends religious service: Not on file    Active member of club or organization: Not on file    Attends meetings of clubs or organizations: Not on file    Relationship status: Not on file  . Intimate partner violence:    Fear of current or ex partner: Not on file    Emotionally abused: Not on file    Physically abused: Not on file    Forced sexual activity: Not on file  Other Topics Concern  . 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 No surrounding redness or drainage. Tenderness nails 1-5 bilaterally.  Mild hyperkeratotic tissue left submetatarsal 3.  Upon debridement no underlying ulceration, drainage or any signs of infection.  No open lesions or pre-ulcerative lesions are identified today.  Vascular: Dorsalis Pedis artery and Posterior Tibial artery pedal pulses are 2/4 bilateral with immedate capillary fill time. There is no pain with calf compression, swelling, warmth, erythema.   Neruologic: Grossly intact via light touch bilateral.Protective threshold with Semmes Wienstein monofilament intact to all pedal sites bilateral.   Musculoskeletal: Prominent metatarsal heads plantarly with atrophy of the fat pad.  Muscular strength 5/5 in all groups tested bilateral.    Assessment:   Symptomatic onychomycosis,  hyperkeratotic lesion     Plan:  -Treatment options discussed including all alternatives, risks, and complications -Etiology of symptoms were discussed -Nails debrided 10 without complications or bleeding. -Hyperkeratotic lesion sharply debrided x1 without any complications or bleeding -Daily foot inspection -Follow-up in 3 months or sooner if any problems arise. In the meantime, encouraged to call the office with any questions, concerns, change in symptoms.   Celesta Gentile, DPM

## 2017-05-06 DIAGNOSIS — N281 Cyst of kidney, acquired: Secondary | ICD-10-CM | POA: Diagnosis not present

## 2017-05-06 DIAGNOSIS — N5201 Erectile dysfunction due to arterial insufficiency: Secondary | ICD-10-CM | POA: Diagnosis not present

## 2017-05-06 DIAGNOSIS — R3912 Poor urinary stream: Secondary | ICD-10-CM | POA: Diagnosis not present

## 2017-05-06 DIAGNOSIS — R310 Gross hematuria: Secondary | ICD-10-CM | POA: Diagnosis not present

## 2017-05-06 DIAGNOSIS — N200 Calculus of kidney: Secondary | ICD-10-CM | POA: Diagnosis not present

## 2017-06-06 ENCOUNTER — Other Ambulatory Visit: Payer: PPO

## 2017-06-06 ENCOUNTER — Ambulatory Visit (INDEPENDENT_AMBULATORY_CARE_PROVIDER_SITE_OTHER): Payer: PPO | Admitting: Internal Medicine

## 2017-06-06 ENCOUNTER — Encounter: Payer: Self-pay | Admitting: Internal Medicine

## 2017-06-06 VITALS — BP 130/68 | Ht 65.0 in | Wt 162.5 lb

## 2017-06-06 DIAGNOSIS — Z86010 Personal history of colonic polyps: Secondary | ICD-10-CM | POA: Diagnosis not present

## 2017-06-06 DIAGNOSIS — D490 Neoplasm of unspecified behavior of digestive system: Secondary | ICD-10-CM

## 2017-06-06 DIAGNOSIS — Z9889 Other specified postprocedural states: Secondary | ICD-10-CM

## 2017-06-06 DIAGNOSIS — K909 Intestinal malabsorption, unspecified: Secondary | ICD-10-CM | POA: Diagnosis not present

## 2017-06-06 DIAGNOSIS — R970 Elevated carcinoembryonic antigen [CEA]: Secondary | ICD-10-CM

## 2017-06-06 MED ORDER — PANCRELIPASE (LIP-PROT-AMYL) 40000-126000 UNITS PO CPEP: 2.0000 | ORAL_CAPSULE | Freq: Three times a day (TID) | ORAL | 0 refills | Status: DC

## 2017-06-06 NOTE — Patient Instructions (Addendum)
  Your provider has requested that you go to the basement level for lab work before leaving today. Press "B" on the elevator. The lab is located at the first door on the left as you exit the elevator.   Today we are giving you samples of Zenpep: Take 2 capsules with meals and 1 with snacks.   Today we are giving you a MyChart code to activate your account.    I appreciate the opportunity to care for you. Silvano Rusk, MD, [facility]

## 2017-06-06 NOTE — Progress Notes (Signed)
Francisco [name] 83 y.o. 1933/04/12 [id number]  Assessment & Plan:   Encounter Diagnoses  Name Primary?  . Elevated carcinoembryonic antigen (CEA) Yes  . Steatorrhea?   Marland Kitchen Hx of adenomatous colonic polyps   . IPMN (intraductal papillary mucinous neoplasm)   . History of pancreatic surgery - [facility]     The elevated CEA level is really of unclear etiology.  I will repeat that though I am not particularly concerned about that and would not necessarily pursue a colonoscopy.  The CEA level returned at 8.3 which is similar to where it was in May 2018 at [facility], they seem to have the same reference range and it was 12.4 in March.  So this is reassuring.  The diarrhea seems like it might be stearrhea though he thinks he is better alternating Jardiance with metformin.  Given the fact that he has had a pancreaticoduodenectomy for pancreatic tumor in the past, I think it is reasonable to give a trial of pancreatic enzyme supplements.  Zen pep 40,000 units lipase will be given to with meals and one with snacks samples provided.  After he tries this we will see if that makes a difference.  He is to contact us.  We have given a my chart code access as well.  I do not think he needs a colonoscopy right now.  I appreciate the opportunity care for this patient  CC: Burnard Bunting, MD Cristino Martes, MD [facility]  Subjective:   Chief Complaint: Abnormal CEA level history of colon polyps  HPI The patient is a very nice elderly white man that I met last year, when he had an I FOBT positive stool.  He reluctantly agreed to have a colonoscopy and I removed 10 adenomas and tubulovillous adenomas the largest polyp was 15 mm.  And thought that due to his age he did not need a repeat colonoscopy.  Subsequently he had follow-up with [facility], note reviewed from March, and the oncologist was concerned about his colonoscopy polyp history and a rising CEA from 8.6-12.4 over about 10 months time.  The  patient is felt well though he has had arm loose stools which he shows me a picture of.  It was thought that perhaps his metformin was causing this, he was to change the Jardiance but he is decided to take his Jardiance and metformin each on alternating days and thinks he is better at this point though he does show me a picture of an orange who really looking stool.  Also note that he had rectal bleeding after his colonoscopy and I found grade 2 prolapsed hemorrhoids that I thought were the source of bleeding and he has not had bleeding since though I think the impression of the [facility] oncologist was that he had had rectal bleeding recently.  The patient steadfastly denies this.  He is status post pancreaticoduodenectomy in 2013 for I PMN.  He had a pancreatic leak and extended hospitalization and drainage.  He told the oncologist also that he was having some urge incontinence at times.  Again that seems better since the change in diabetes medication.  He had a follow-up MRI in March of this year, stable small liver lesions stable atrophy of the posterior right hepatic lobe and stable mild dilation of the pancreatic duct and a cystic lesion within the pancreatic tail thought to be a sidebranch IPMN.  Wt Readings from Last 3 Encounters:  06/06/17 162 lb 8 oz (73.7 kg)  05/23/16 171 lb (  77.6 kg)  02/07/16 158 lb (71.7 kg)    I have records from Dr. Reynaldo Minium to review and I have done so.  Note from April 09, 2017 is reviewed.  He actually had a November 2018 immune fecal occult blood test that was negative.  He had a CT of the chest at [facility] as well with small pulmonary nodules that were less than 4 mm. No Known Allergies Current Meds  Medication Sig  . ACCU-CHEK FASTCLIX LANCETS MISC by Does not apply route daily.  Marland Kitchen aspirin 81 MG tablet Take 81 mg by mouth daily.  Marland Kitchen desonide (DESOWEN) 0.05 % cream Apply topically 2 (two) times daily.  Marland Kitchen desoximetasone (TOPICORT) 0.25 % cream Apply 1 application topically as  needed.   . finasteride (PROSCAR) 5 MG tablet Take 5 mg by mouth daily.  Marland Kitchen glucose blood (ACCU-CHEK SMARTVIEW) test strip 1 each by Other route daily. Use as instructed  . JARDIANCE 10 MG TABS tablet   . loratadine (CLARITIN) 10 MG tablet Take 10 mg by mouth daily.  . metFORMIN (GLUCOPHAGE) 500 MG tablet Take 500 mg by mouth daily.   . Multiple Vitamin (MULTIVITAMIN) tablet Take 1 tablet by mouth daily.  . naproxen sodium (ANAPROX) 220 MG tablet Take 220 mg by mouth continuous as needed (for pain).  . pantoprazole (PROTONIX) 40 MG tablet Take 40 mg by mouth daily.  . Probiotic Product (ALIGN) 4 MG CAPS Take 1 capsule by mouth daily.  . tamsulosin (FLOMAX) 0.4 MG CAPS capsule Take 0.4 mg by mouth 2 (two) times daily.  Marland Kitchen triamcinolone cream (KENALOG) 0.1 % Apply topically 2 (two) times daily.   Past Medical History:  Diagnosis Date  . Asthma with allergic rhinitis   . BPH (benign prostatic hyperplasia)   . DM (diabetes mellitus) ([place])   . Elevated prostate specific antigen (PSA)   . Fatty liver   . GERD (gastroesophageal reflux disease)   . Hx of adenomatous colonic polyps 02/14/2016   10 adenomas max 15 mm 02/2016 - no recall at his age  . Hyperlipidemia   . Impotence of organic origin   . Neoplasm of uncertain behavior of prostate   . Pancreatic cyst FOLLOWED BY DR JACOBS  . Personal history of urinary calculi   . Right ureteral stone    Past Surgical History:  Procedure Laterality Date  . COLONOSCOPY W/ POLYPECTOMY  2018  . CYSTOSCOPY WITH URETEROSCOPY  03/14/2011   Procedure: CYSTOSCOPY WITH URETEROSCOPY;  Surgeon: Molli Hazard, MD;  Location: [facility];  Service: Urology;  Laterality: Right;  2 hours requested for this case  Right Ureter Stent Placement  C-ARM CAMERA DIGITAL URETEROSCOPE    . EUS  04/12/2011   Procedure: UPPER ENDOSCOPIC ULTRASOUND (EUS) LINEAR;  Surgeon: Milus Banister, MD;  Location: [facility];  Service: Endoscopy;   Laterality: N/A;  radial linear/pt moved up a hour early by AW , Patty @ [place] office ok'd & pt was called by AW  . KIDNEY STONE SURGERY  March 2013  . PERCUTANEOUS NEPHROSTOLITHOTOMY  1985  . 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   Review of  Systems As per HPI.  He continues to auctioneer and is active and feels well overall.  Objective:   Physical Exam BP 130/68   Ht '5\' 5"'$  (1.651 m)   Wt 162 lb 8 oz (73.7 kg)   BMI 27.04 kg/m  NAD Eyes anicteric Lungs cta Cor s1s2 abd chevron scar mild hernia Alert and oriented x3 Appropriate mood and affect

## 2017-06-07 LAB — CEA: CEA: 8.3 ng/mL — AB

## 2017-06-09 ENCOUNTER — Encounter: Payer: Self-pay | Admitting: Internal Medicine

## 2017-06-09 NOTE — Progress Notes (Signed)
CEA level back to 2018 level  Does he think that Zenpep is helping?

## 2017-06-10 NOTE — Progress Notes (Signed)
OK - I understand - he was feeling better on the different DM med - Jardiance  Ask him to let me know his weight after he finishes the Zenpep samples  Let's have him come back to see me next avail also - if he will

## 2017-06-10 NOTE — Progress Notes (Signed)
Did it change his bowel movements any (firmer, less frequent?)

## 2017-06-14 ENCOUNTER — Telehealth: Payer: Self-pay | Admitting: Internal Medicine

## 2017-06-14 NOTE — Telephone Encounter (Signed)
That is very unlikely but makes sense to stop the Zenpep   If he does not get better - let me know - or if worse get hep from Dr. Reynaldo Minium or the ER

## 2017-06-14 NOTE — Telephone Encounter (Signed)
Patient informed and doesn't want to have to go to the ER, said too long of a wait and too much money. He will watch things and call us back next week with an update.

## 2017-06-14 NOTE — Telephone Encounter (Signed)
I spoke with Francisco [name] and he said that there was "water" running out of his arms and also that his legs were swollen , he gained 4lbs he thinks. He feels its coming from the zenpep so he stopped that yesterday. He feels better today and no more "water" coming out of his arms. Please advise Sir?

## 2017-06-20 NOTE — Telephone Encounter (Signed)
I left him a message that I wanted to see how he was doing and for him to call me back.

## 2017-06-21 NOTE — Telephone Encounter (Signed)
Pt states he is returning phone call to PJ best call back # [phone].

## 2017-06-21 NOTE — Telephone Encounter (Signed)
Patient advised and verbalized understanding 

## 2017-06-21 NOTE — Telephone Encounter (Signed)
Spoke with Francisco [name] and he said no more water is coming from his arms. However he's having to urinate 4-5 times a night. His weight is down to 158 lbs.  He has a follow up appointment with Dr Carlean Purl on 08/14/17. Please advise if anything else to do before his appointment.

## 2017-06-21 NOTE — Telephone Encounter (Signed)
He should check with his PCP if the urination issue is new  I do not think that is/was related to the trial of pancreatic enzymes.  Glad he is better re: arms

## 2017-07-26 ENCOUNTER — Ambulatory Visit: Payer: PPO | Admitting: Podiatry

## 2017-07-26 DIAGNOSIS — M79674 Pain in right toe(s): Secondary | ICD-10-CM

## 2017-07-26 DIAGNOSIS — M79675 Pain in left toe(s): Secondary | ICD-10-CM

## 2017-07-26 DIAGNOSIS — B351 Tinea unguium: Secondary | ICD-10-CM

## 2017-07-29 NOTE — Progress Notes (Signed)
Subjective: 84 y.o. returns the office today for painful, elongated, thickened toenails which he cannot trim himself. Denies any redness or drainage around the nails. Denies any acute changes since last appointment and no new complaints today. Denies any systemic complaints such as fevers, chills, nausea, vomiting.   PCP: Burnard Bunting, MD  Objective: AAO 3, NAD DP/PT pulses palpable, CRT less than 3 seconds Nails hypertrophic, dystrophic, elongated, brittle, discolored 10. There is tenderness overlying the nails 1-5 bilaterally. There is no surrounding erythema or drainage along the nail sites. No open lesions or pre-ulcerative lesions are identified. No other areas of tenderness bilateral lower extremities. No overlying edema, erythema, increased warmth. No pain with calf compression, swelling, warmth, erythema.  Assessment: Patient presents with symptomatic onychomycosis  Plan: -Treatment options including alternatives, risks, complications were discussed -Nails sharply debrided 10 without complication/bleeding. -Discussed daily foot inspection. If there are any changes, to call the office immediately.  -Follow-up in 3 months or sooner if any problems are to arise. In the meantime, encouraged to call the office with any questions, concerns, changes symptoms.  Celesta Gentile, DPM

## 2017-08-14 ENCOUNTER — Other Ambulatory Visit: Payer: PPO

## 2017-08-14 ENCOUNTER — Ambulatory Visit (INDEPENDENT_AMBULATORY_CARE_PROVIDER_SITE_OTHER): Payer: PPO | Admitting: Internal Medicine

## 2017-08-14 ENCOUNTER — Encounter: Payer: Self-pay | Admitting: Internal Medicine

## 2017-08-14 VITALS — BP 138/60 | HR 60 | Ht 65.0 in | Wt 159.8 lb

## 2017-08-14 DIAGNOSIS — K8689 Other specified diseases of pancreas: Secondary | ICD-10-CM

## 2017-08-14 DIAGNOSIS — T50905A Adverse effect of unspecified drugs, medicaments and biological substances, initial encounter: Secondary | ICD-10-CM

## 2017-08-14 DIAGNOSIS — R970 Elevated carcinoembryonic antigen [CEA]: Secondary | ICD-10-CM | POA: Diagnosis not present

## 2017-08-14 MED ORDER — PANCRELIPASE (LIP-PROT-AMYL) 36000-114000 UNITS PO CPEP: 72000.0000 [IU] | ORAL_CAPSULE | Freq: Three times a day (TID) | ORAL | 0 refills | Status: DC

## 2017-08-14 NOTE — Patient Instructions (Addendum)
Your provider has requested that you go to the basement level for lab work before leaving today. Press "B" on the elevator. The lab is located at the first door on the left as you exit the elevator.   We are giving you creon samples to try: Take 2 capsules with meals and call us back and let us know how you feel.    I appreciate the opportunity to care for you. Silvano Rusk, MD, [facility]

## 2017-08-14 NOTE — Progress Notes (Signed)
Francisco [name] 83 y.o. [birth year] [id number]  Assessment & Plan:   Encounter Diagnoses  Name Primary?  . Secondary pancreatic insufficiency s/p pancreaticoduodenoctomy Yes  . Elevated carcinoembryonic antigen (CEA)   . Adverse effect of drug, initial encounter??     I think he probably has some steatorrhea and am thinking water leaking from arms may be coincidental and not medication adverse event. We have decided to try Creon 36K 2 w/ meals  Recheck CEA - was better last time and I think not likely to be an issue or significant but if marked increase may need further imaging/other investigation  I appreciate the opportunity to care for this patient. CX:KGYJEHU, [name], MD   Lab Results  Component Value Date   CEA 7.8 (H) 08/14/2017   Returned lower than last - good news. Suspect age-related or other phenomenon but do not think he has cancer Subjective:   Chief Complaint: diarrhea and elevated cea  HPI Doing well overall but some episodic loose stools. Trying to lose weight.  Had water leaking from both arms with Zenpep and stopped it. Says had that in past when given some rx at [facility] also.  OK now   Wt Readings from Last 3 Encounters:  08/14/17 159 lb 12.8 oz (72.5 kg)  06/06/17 162 lb 8 oz (73.7 kg)  05/23/16 171 lb (77.6 kg)    No Known Allergies Current Meds  Medication Sig  . ACCU-CHEK FASTCLIX LANCETS MISC by Does not apply route daily.  Marland Kitchen aspirin 81 MG tablet Take 81 mg by mouth daily.  Marland Kitchen desonide (DESOWEN) 0.05 % cream Apply topically 2 (two) times daily.  Marland Kitchen desoximetasone (TOPICORT) 0.25 % cream Apply 1 application topically as needed.   . finasteride (PROSCAR) 5 MG tablet Take 5 mg by mouth daily.  Marland Kitchen glucose blood (ACCU-CHEK SMARTVIEW) test strip 1 each by Other route daily. Use as instructed  . JARDIANCE 10 MG TABS tablet   . loratadine (CLARITIN) 10 MG tablet Take 10 mg by mouth daily.  . metFORMIN (GLUCOPHAGE) 500 MG tablet Take 500 mg by  mouth daily.   . Multiple Vitamin (MULTIVITAMIN) tablet Take 1 tablet by mouth daily.  . naproxen sodium (ANAPROX) 220 MG tablet Take 220 mg by mouth continuous as needed (for pain).  . pantoprazole (PROTONIX) 40 MG tablet Take 40 mg by mouth daily.  . Probiotic Product (ALIGN) 4 MG CAPS Take 1 capsule by mouth daily.  . tamsulosin (FLOMAX) 0.4 MG CAPS capsule Take 0.4 mg by mouth 2 (two) times daily.  Marland Kitchen triamcinolone cream (KENALOG) 0.1 % Apply topically 2 (two) times daily.   Past Medical History:  Diagnosis Date  . Asthma with allergic rhinitis   . BPH (benign prostatic hyperplasia)   . DM (diabetes mellitus) ([place])   . Elevated CEA   . Elevated prostate specific antigen (PSA)   . Fatty liver   . GERD (gastroesophageal reflux disease)   . Hx of adenomatous colonic polyps 02/14/2016   10 adenomas max 15 mm 02/2016 - no recall at his age  . Hyperlipidemia   . Impotence of organic origin   . Neoplasm of uncertain behavior of prostate   . Pancreatic cyst FOLLOWED BY DR JACOBS  . Personal history of urinary calculi   . Right ureteral stone    Past Surgical History:  Procedure Laterality Date  . COLONOSCOPY W/ POLYPECTOMY  2018  . CYSTOSCOPY WITH URETEROSCOPY  03/14/2011   Procedure: CYSTOSCOPY WITH URETEROSCOPY;  Surgeon: Dennard Schaumann  Jasmine December, MD;  Location: [facility];  Service: Urology;  Laterality: Right;  2 hours requested for this case  Right Ureter Stent Placement  C-ARM CAMERA DIGITAL URETEROSCOPE    . EUS  04/12/2011   Procedure: UPPER ENDOSCOPIC ULTRASOUND (EUS) LINEAR;  Surgeon: Milus Banister, MD;  Location: [facility];  Service: Endoscopy;  Laterality: N/A;  radial linear/pt moved up a hour early by AW , Patty @ [place] office ok'd & pt was called by AW  . KIDNEY STONE SURGERY  March 2013  . PERCUTANEOUS NEPHROSTOLITHOTOMY  1985  . 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   Review of Systems As above  Objective:   Physical Exam BP 138/60   Pulse 60   Ht 5\' 5"  (1.651 m)   Wt 159 lb 12.8 oz (72.5 kg)   BMI 26.59 kg/m  NAD  15 minutes time spent with patient > half in counseling coordination of care

## 2017-08-16 LAB — CEA: CEA: 7.8 ng/mL — AB

## 2017-08-19 NOTE — Progress Notes (Signed)
OK  I would not continue the Creon and he can see me as needed

## 2017-08-19 NOTE — Progress Notes (Signed)
Please let him know this is ok Lower than before I do not think it is a problem  Also ask him how the Creon samples are doing for diarrhea? Let me know please

## 2017-09-18 DIAGNOSIS — Z87442 Personal history of urinary calculi: Secondary | ICD-10-CM | POA: Diagnosis not present

## 2017-09-18 DIAGNOSIS — K760 Fatty (change of) liver, not elsewhere classified: Secondary | ICD-10-CM | POA: Diagnosis not present

## 2017-09-18 DIAGNOSIS — M7989 Other specified soft tissue disorders: Secondary | ICD-10-CM | POA: Diagnosis not present

## 2017-09-18 DIAGNOSIS — L600 Ingrowing nail: Secondary | ICD-10-CM | POA: Diagnosis not present

## 2017-09-18 DIAGNOSIS — Z6825 Body mass index (BMI) 25.0-25.9, adult: Secondary | ICD-10-CM | POA: Diagnosis not present

## 2017-09-18 DIAGNOSIS — D692 Other nonthrombocytopenic purpura: Secondary | ICD-10-CM | POA: Diagnosis not present

## 2017-09-18 DIAGNOSIS — E1169 Type 2 diabetes mellitus with other specified complication: Secondary | ICD-10-CM | POA: Diagnosis not present

## 2017-09-18 DIAGNOSIS — R970 Elevated carcinoembryonic antigen [CEA]: Secondary | ICD-10-CM | POA: Diagnosis not present

## 2017-09-18 DIAGNOSIS — E663 Overweight: Secondary | ICD-10-CM | POA: Diagnosis not present

## 2017-09-18 DIAGNOSIS — K219 Gastro-esophageal reflux disease without esophagitis: Secondary | ICD-10-CM | POA: Diagnosis not present

## 2017-09-18 DIAGNOSIS — K869 Disease of pancreas, unspecified: Secondary | ICD-10-CM | POA: Diagnosis not present

## 2017-09-18 DIAGNOSIS — E7849 Other hyperlipidemia: Secondary | ICD-10-CM | POA: Diagnosis not present

## 2017-10-25 ENCOUNTER — Encounter: Payer: Self-pay | Admitting: Podiatry

## 2017-10-25 ENCOUNTER — Ambulatory Visit: Payer: PPO | Admitting: Podiatry

## 2017-10-25 DIAGNOSIS — B351 Tinea unguium: Secondary | ICD-10-CM | POA: Diagnosis not present

## 2017-10-25 DIAGNOSIS — M79675 Pain in left toe(s): Secondary | ICD-10-CM

## 2017-10-25 DIAGNOSIS — M79674 Pain in right toe(s): Secondary | ICD-10-CM | POA: Diagnosis not present

## 2017-10-25 NOTE — Progress Notes (Signed)
Subjective: 84 y.o. returns the office today for painful, elongated, thickened toenails which he cannot trim himself. Denies any redness or drainage around the nails. Denies any acute changes since last appointment and no new complaints today. Denies any systemic complaints such as fevers, chills, nausea, vomiting.   PCP: Burnard Bunting, MD  Objective: AAO 3, NAD DP/PT pulses palpable, CRT less than 3 seconds Nails hypertrophic, dystrophic, elongated, brittle, discolored 10. There is tenderness overlying the nails 1-5 bilaterally. There is no surrounding erythema or drainage along the nail sites. No open lesions or pre-ulcerative lesions are identified. No other areas of tenderness bilateral lower extremities. No overlying edema, erythema, increased warmth. No pain with calf compression, swelling, warmth, erythema.  Assessment: Patient presents with symptomatic onychomycosis  Plan: -Treatment options including alternatives, risks, complications were discussed -Nails sharply debrided 10 without complication/bleeding. -Discussed daily foot inspection. If there are any changes, to call the office immediately.  -Follow-up in 3 months or as needed if any problems are to arise. He states that he does not feel he needs to come in. I will see him back as needed. If there are any issues to let us know.   Celesta Gentile, DPM

## 2017-11-01 DIAGNOSIS — Z23 Encounter for immunization: Secondary | ICD-10-CM | POA: Diagnosis not present

## 2017-11-13 DIAGNOSIS — C44729 Squamous cell carcinoma of skin of left lower limb, including hip: Secondary | ICD-10-CM | POA: Diagnosis not present

## 2017-11-13 DIAGNOSIS — L570 Actinic keratosis: Secondary | ICD-10-CM | POA: Diagnosis not present

## 2017-11-13 DIAGNOSIS — L578 Other skin changes due to chronic exposure to nonionizing radiation: Secondary | ICD-10-CM | POA: Diagnosis not present

## 2017-11-13 DIAGNOSIS — L300 Nummular dermatitis: Secondary | ICD-10-CM | POA: Diagnosis not present

## 2017-11-13 DIAGNOSIS — L821 Other seborrheic keratosis: Secondary | ICD-10-CM | POA: Diagnosis not present

## 2018-01-07 DIAGNOSIS — E1169 Type 2 diabetes mellitus with other specified complication: Secondary | ICD-10-CM | POA: Diagnosis not present

## 2018-01-07 DIAGNOSIS — Z125 Encounter for screening for malignant neoplasm of prostate: Secondary | ICD-10-CM | POA: Diagnosis not present

## 2018-01-07 DIAGNOSIS — E7849 Other hyperlipidemia: Secondary | ICD-10-CM | POA: Diagnosis not present

## 2018-01-15 DIAGNOSIS — Z0000 Encounter for general adult medical examination without abnormal findings: Secondary | ICD-10-CM | POA: Diagnosis not present

## 2018-01-15 DIAGNOSIS — D692 Other nonthrombocytopenic purpura: Secondary | ICD-10-CM | POA: Diagnosis not present

## 2018-01-15 DIAGNOSIS — E7849 Other hyperlipidemia: Secondary | ICD-10-CM | POA: Diagnosis not present

## 2018-01-15 DIAGNOSIS — K869 Disease of pancreas, unspecified: Secondary | ICD-10-CM | POA: Diagnosis not present

## 2018-01-15 DIAGNOSIS — Z6824 Body mass index (BMI) 24.0-24.9, adult: Secondary | ICD-10-CM | POA: Diagnosis not present

## 2018-01-15 DIAGNOSIS — R197 Diarrhea, unspecified: Secondary | ICD-10-CM | POA: Diagnosis not present

## 2018-01-15 DIAGNOSIS — M7989 Other specified soft tissue disorders: Secondary | ICD-10-CM | POA: Diagnosis not present

## 2018-01-15 DIAGNOSIS — R970 Elevated carcinoembryonic antigen [CEA]: Secondary | ICD-10-CM | POA: Diagnosis not present

## 2018-01-15 DIAGNOSIS — K760 Fatty (change of) liver, not elsewhere classified: Secondary | ICD-10-CM | POA: Diagnosis not present

## 2018-01-15 DIAGNOSIS — Z1331 Encounter for screening for depression: Secondary | ICD-10-CM | POA: Diagnosis not present

## 2018-01-15 DIAGNOSIS — E1169 Type 2 diabetes mellitus with other specified complication: Secondary | ICD-10-CM | POA: Diagnosis not present

## 2018-01-15 DIAGNOSIS — Z1339 Encounter for screening examination for other mental health and behavioral disorders: Secondary | ICD-10-CM | POA: Diagnosis not present

## 2018-03-11 DIAGNOSIS — K7689 Other specified diseases of liver: Secondary | ICD-10-CM | POA: Diagnosis not present

## 2018-03-11 DIAGNOSIS — R970 Elevated carcinoembryonic antigen [CEA]: Secondary | ICD-10-CM | POA: Diagnosis not present

## 2018-03-11 DIAGNOSIS — R933 Abnormal findings on diagnostic imaging of other parts of digestive tract: Secondary | ICD-10-CM | POA: Diagnosis not present

## 2018-03-11 DIAGNOSIS — Z86010 Personal history of colonic polyps: Secondary | ICD-10-CM | POA: Diagnosis not present

## 2018-03-11 DIAGNOSIS — Z87891 Personal history of nicotine dependence: Secondary | ICD-10-CM | POA: Diagnosis not present

## 2018-03-11 DIAGNOSIS — Z90411 Acquired partial absence of pancreas: Secondary | ICD-10-CM | POA: Diagnosis not present

## 2018-03-11 DIAGNOSIS — D490 Neoplasm of unspecified behavior of digestive system: Secondary | ICD-10-CM | POA: Diagnosis not present

## 2018-03-11 DIAGNOSIS — Z09 Encounter for follow-up examination after completed treatment for conditions other than malignant neoplasm: Secondary | ICD-10-CM | POA: Diagnosis not present

## 2018-03-11 DIAGNOSIS — K862 Cyst of pancreas: Secondary | ICD-10-CM | POA: Diagnosis not present

## 2018-03-17 DIAGNOSIS — L578 Other skin changes due to chronic exposure to nonionizing radiation: Secondary | ICD-10-CM | POA: Diagnosis not present

## 2018-03-17 DIAGNOSIS — L570 Actinic keratosis: Secondary | ICD-10-CM | POA: Diagnosis not present

## 2018-03-17 DIAGNOSIS — L821 Other seborrheic keratosis: Secondary | ICD-10-CM | POA: Diagnosis not present

## 2018-04-23 ENCOUNTER — Encounter: Payer: Self-pay | Admitting: Podiatry

## 2018-04-24 ENCOUNTER — Encounter: Payer: Self-pay | Admitting: Podiatry

## 2018-04-28 ENCOUNTER — Other Ambulatory Visit: Payer: Self-pay

## 2018-04-28 ENCOUNTER — Ambulatory Visit: Payer: PPO

## 2018-04-28 ENCOUNTER — Ambulatory Visit: Payer: PPO | Admitting: Podiatry

## 2018-04-28 ENCOUNTER — Encounter: Payer: Self-pay | Admitting: Podiatry

## 2018-04-28 VITALS — Temp 97.9°F

## 2018-04-28 DIAGNOSIS — E119 Type 2 diabetes mellitus without complications: Secondary | ICD-10-CM | POA: Diagnosis not present

## 2018-04-28 DIAGNOSIS — Q828 Other specified congenital malformations of skin: Secondary | ICD-10-CM

## 2018-04-28 DIAGNOSIS — B351 Tinea unguium: Secondary | ICD-10-CM

## 2018-04-28 DIAGNOSIS — M2042 Other hammer toe(s) (acquired), left foot: Secondary | ICD-10-CM

## 2018-04-28 DIAGNOSIS — M79675 Pain in left toe(s): Secondary | ICD-10-CM

## 2018-04-28 DIAGNOSIS — M79674 Pain in right toe(s): Secondary | ICD-10-CM

## 2018-04-28 NOTE — Progress Notes (Signed)
Subjective: 85 y.o. returns the office today for painful, elongated, thickened toenails which he cannot trim himself.  He also still is getting a painful callus on the bottom of his left foot.  He uses corn pads which helps some.  He is asking if it can be frozen off. Denies any redness or drainage or any swelling.  No other concerns. Denies any systemic complaints such as fevers, chills, nausea, vomiting.   PCP: Francisco Bunting, MD  Objective: AAO 3, NAD DP/PT pulses palpable, CRT less than 3 seconds Nails hypertrophic, dystrophic, elongated, brittle, discolored 10. There is tenderness overlying the nails 1-5 bilaterally. There is no surrounding erythema or drainage along the nail sites. Hyperkeratotic lesion of the left foot just proximal to the second metatarsal head plantarly.  Upon debridement there is no ongoing ulceration drainage or signs of infection.  There is no evidence of verruca. No open lesions or pre-ulcerative lesions are identified. No other areas of tenderness bilateral lower extremities. No overlying edema, erythema, increased warmth. No pain with calf compression, swelling, warmth, erythema.  Assessment: Patient presents with symptomatic onychomycosis; hyperkeratotic lesion  Plan: -Treatment options including alternatives, risks, complications were discussed -Nails sharply debrided 10 without complication/bleeding. -Hyperkeratotic lesion with sharp debrided without any complications or bleeding.  Areas cleaned with alcohol pad was placed followed by salicylic acid and a bandage.  Post procedure instructions were discussed. Monitor for any signs or symptoms of infection.  -Discussed daily foot inspection. If there are any changes, to call the office immediately.  -Follow-up in 3 months or as needed if any problems are to arise. He states that he does not feel he needs to come in. I will see him back as needed. If there are any issues to let us know.   Celesta Gentile,  DPM

## 2018-04-28 NOTE — Patient Instructions (Signed)
Keep the bandage on for 24 hours. At that time, remove and clean with soap and water. If it hurts or burns before 24 hours go ahead and remove the bandage and wash with soap and water. Keep the area clean. If there is any blistering cover with antibiotic ointment and a bandage. Monitor for any redness, drainage, or other signs of infection. Call the office if any are to occur. If you have any questions, please call the office at [phone].  

## 2018-05-13 DIAGNOSIS — N5201 Erectile dysfunction due to arterial insufficiency: Secondary | ICD-10-CM | POA: Diagnosis not present

## 2018-05-13 DIAGNOSIS — N200 Calculus of kidney: Secondary | ICD-10-CM | POA: Diagnosis not present

## 2018-05-13 DIAGNOSIS — R3912 Poor urinary stream: Secondary | ICD-10-CM | POA: Diagnosis not present

## 2018-05-19 DIAGNOSIS — E785 Hyperlipidemia, unspecified: Secondary | ICD-10-CM | POA: Diagnosis not present

## 2018-05-19 DIAGNOSIS — M7989 Other specified soft tissue disorders: Secondary | ICD-10-CM | POA: Diagnosis not present

## 2018-05-19 DIAGNOSIS — K760 Fatty (change of) liver, not elsewhere classified: Secondary | ICD-10-CM | POA: Diagnosis not present

## 2018-05-19 DIAGNOSIS — K219 Gastro-esophageal reflux disease without esophagitis: Secondary | ICD-10-CM | POA: Diagnosis not present

## 2018-05-19 DIAGNOSIS — E1169 Type 2 diabetes mellitus with other specified complication: Secondary | ICD-10-CM | POA: Diagnosis not present

## 2018-05-19 DIAGNOSIS — K869 Disease of pancreas, unspecified: Secondary | ICD-10-CM | POA: Diagnosis not present

## 2018-05-19 DIAGNOSIS — D692 Other nonthrombocytopenic purpura: Secondary | ICD-10-CM | POA: Diagnosis not present

## 2018-05-19 DIAGNOSIS — Z87442 Personal history of urinary calculi: Secondary | ICD-10-CM | POA: Diagnosis not present

## 2018-05-19 DIAGNOSIS — L600 Ingrowing nail: Secondary | ICD-10-CM | POA: Diagnosis not present

## 2018-05-19 DIAGNOSIS — R970 Elevated carcinoembryonic antigen [CEA]: Secondary | ICD-10-CM | POA: Diagnosis not present

## 2018-07-18 DIAGNOSIS — L821 Other seborrheic keratosis: Secondary | ICD-10-CM | POA: Diagnosis not present

## 2018-07-18 DIAGNOSIS — L570 Actinic keratosis: Secondary | ICD-10-CM | POA: Diagnosis not present

## 2018-07-18 DIAGNOSIS — L578 Other skin changes due to chronic exposure to nonionizing radiation: Secondary | ICD-10-CM | POA: Diagnosis not present

## 2018-07-28 ENCOUNTER — Other Ambulatory Visit: Payer: Self-pay

## 2018-07-28 ENCOUNTER — Ambulatory Visit: Payer: PPO | Admitting: Podiatry

## 2018-07-28 VITALS — Temp 97.3°F

## 2018-07-28 DIAGNOSIS — M79674 Pain in right toe(s): Secondary | ICD-10-CM

## 2018-07-28 DIAGNOSIS — B351 Tinea unguium: Secondary | ICD-10-CM | POA: Diagnosis not present

## 2018-07-28 DIAGNOSIS — E119 Type 2 diabetes mellitus without complications: Secondary | ICD-10-CM

## 2018-07-28 DIAGNOSIS — M79675 Pain in left toe(s): Secondary | ICD-10-CM

## 2018-07-28 NOTE — Progress Notes (Signed)
Subjective: 85 y.o. returns the office today for painful, elongated, thickened toenails which he cannot trim himself. Callus is doing much better.  Denies any redness or drainage or any swelling.  No other concerns. Denies any systemic complaints such as fevers, chills, nausea, vomiting.   PCP: Burnard Bunting, MD  Objective: AAO 3, NAD DP/PT pulses palpable, CRT less than 3 seconds Nails hypertrophic, dystrophic, elongated, brittle, discolored 10. There is tenderness overlying the nails 1-5 bilaterally. There is no surrounding erythema or drainage along the nail sites. Minimal hyperkeratotic tissue left foot. No ulcerations, drainage or signs of infection.  No other areas of tenderness bilateral lower extremities. No overlying edema, erythema, increased warmth. No pain with calf compression, swelling, warmth, erythema.  Assessment: Patient presents with symptomatic onychomycosis; hyperkeratotic lesion  Plan: -Treatment options including alternatives, risks, complications were discussed -Nails sharply debrided 10 without complication/bleeding. -Lightly debrided hyperkeratotic tissue without any complications or bleeding.  -Discussed daily foot inspection. If there are any changes, to call the office immediately.  -Follow-up in 3 months or as needed if any problems are to arise. He states that he does not feel he needs to come in. I will see him back as needed. If there are any issues to let us know.   Celesta Gentile, DPM

## 2018-08-03 ENCOUNTER — Encounter (HOSPITAL_COMMUNITY): Payer: Self-pay

## 2018-08-03 ENCOUNTER — Emergency Department (HOSPITAL_COMMUNITY)
Admission: EM | Admit: 2018-08-03 | Discharge: 2018-08-03 | Disposition: A | Discharge status: Home / Self Care | Payer: PPO | Attending: Emergency Medicine | Admitting: Emergency Medicine

## 2018-08-03 ENCOUNTER — Other Ambulatory Visit: Payer: Self-pay

## 2018-08-03 DIAGNOSIS — R339 Retention of urine, unspecified: Secondary | ICD-10-CM | POA: Insufficient documentation

## 2018-08-03 DIAGNOSIS — J45909 Unspecified asthma, uncomplicated: Secondary | ICD-10-CM | POA: Diagnosis not present

## 2018-08-03 DIAGNOSIS — Z7984 Long term (current) use of oral hypoglycemic drugs: Secondary | ICD-10-CM | POA: Insufficient documentation

## 2018-08-03 DIAGNOSIS — Z79899 Other long term (current) drug therapy: Secondary | ICD-10-CM | POA: Insufficient documentation

## 2018-08-03 DIAGNOSIS — E119 Type 2 diabetes mellitus without complications: Secondary | ICD-10-CM | POA: Insufficient documentation

## 2018-08-03 DIAGNOSIS — R338 Other retention of urine: Secondary | ICD-10-CM

## 2018-08-03 DIAGNOSIS — Z87891 Personal history of nicotine dependence: Secondary | ICD-10-CM | POA: Insufficient documentation

## 2018-08-03 DIAGNOSIS — Z7982 Long term (current) use of aspirin: Secondary | ICD-10-CM | POA: Diagnosis not present

## 2018-08-03 LAB — URINALYSIS, ROUTINE W REFLEX MICROSCOPIC
Bilirubin Urine: NEGATIVE
Glucose, UA: NEGATIVE mg/dL
Ketones, ur: NEGATIVE mg/dL
Leukocytes,Ua: NEGATIVE
Nitrite: NEGATIVE
Protein, ur: NEGATIVE mg/dL
Specific Gravity, Urine: 1.008 (ref 1.005–1.030)
pH: 5 (ref 5.0–8.0)

## 2018-08-03 LAB — I-STAT CHEM 8, ED
BUN: 18 mg/dL (ref 8–23)
Calcium, Ion: 1.24 mmol/L (ref 1.15–1.40)
Chloride: 108 mmol/L (ref 98–111)
Creatinine, Ser: 0.8 mg/dL (ref 0.61–1.24)
Glucose, Bld: 160 mg/dL — ABNORMAL HIGH (ref 70–99)
HCT: 41 % (ref 39.0–52.0)
Hemoglobin: 13.9 g/dL (ref 13.0–17.0)
Potassium: 4.3 mmol/L (ref 3.5–5.1)
Sodium: 140 mmol/L (ref 135–145)
TCO2: 22 mmol/L (ref 22–32)

## 2018-08-03 NOTE — ED Notes (Signed)
RN Fara Chute Reports bladder scanner noted 590ml's in patient's bladder

## 2018-08-03 NOTE — ED Notes (Signed)
RN Fara Chute changed patient's bag to a leg bag.

## 2018-08-03 NOTE — Discharge Instructions (Signed)
Please see the Urologist in 3-5 days.

## 2018-08-03 NOTE — ED Provider Notes (Signed)
[place] DEPT Provider Note   CSN: [id number] Arrival date & time: 08/03/18  1357     History   Chief Complaint Chief Complaint  Patient presents with  . Urinary Retention    HPI Francisco [name] is a 85 y.o. male.     HPI  Pt comes in with cc of urinary retention. He has hx of BPH. Pt has been dribbling the last few days - but starting yday he has had significant problem voiding and having abd pain and distention.  He is having lower quadrant abdominal discomfort.  No new medications. Denies any fevers, chills.  Past Medical History:  Diagnosis Date  . Asthma with allergic rhinitis   . BPH (benign prostatic hyperplasia)   . DM (diabetes mellitus) ([place])   . Elevated CEA   . Elevated prostate specific antigen (PSA)   . Fatty liver   . GERD (gastroesophageal reflux disease)   . Hx of adenomatous colonic polyps 02/14/2016   10 adenomas max 15 mm 02/2016 - no recall at his age  . Hyperlipidemia   . Impotence of organic origin   . Neoplasm of uncertain behavior of prostate   . Pancreatic cyst FOLLOWED BY DR JACOBS  . Personal history of urinary calculi   . Renal calculi 09/19/2011  . Right ureteral stone     Patient Active Problem List   Diagnosis Date Noted  . Diabetes mellitus type 2, uncomplicated ([place]) 88/28/0034  . Internal Gr 2 and Gr 1 external bleeding hemorrhoids 05/23/2016  . Hx of adenomatous colonic polyps 02/14/2016  . Benign prostatic hyperplasia 11/26/2011  . Renal calculi 09/19/2011  . IPMN (intraductal papillary mucinous neoplasm), probable main duct, solid component.   05/14/2011    Past Surgical History:  Procedure Laterality Date  . COLONOSCOPY W/ POLYPECTOMY  2018  . CYSTOSCOPY WITH URETEROSCOPY  03/14/2011   Procedure: CYSTOSCOPY WITH URETEROSCOPY;  Surgeon: Molli Hazard, MD;  Location: [facility];  Service: Urology;  Laterality: Right;  2 hours requested for this case  Right Ureter  Stent Placement  C-ARM CAMERA DIGITAL URETEROSCOPE    . EUS  04/12/2011   Procedure: UPPER ENDOSCOPIC ULTRASOUND (EUS) LINEAR;  Surgeon: Milus Banister, MD;  Location: [facility];  Service: Endoscopy;  Laterality: N/A;  radial linear/pt moved up a hour early by AW , Patty @ [place] office ok'd & pt was called by AW  . KIDNEY STONE SURGERY  March 2013  . PERCUTANEOUS NEPHROSTOLITHOTOMY  1985  . WHIPPLE PROCEDURE  12/2011   [facility]        Home Medications    Prior to Admission medications   Medication Sig Start Date End Date Taking? Authorizing Provider  APPLE CIDER VINEGAR PO Take 450 mg by mouth daily.   Yes [provider]  aspirin 81 MG tablet Take 81 mg by mouth daily.   Yes [provider]  Cinnamon 500 MG TABS Take 1,000 mg by mouth daily.   Yes [provider]  finasteride (PROSCAR) 5 MG tablet Take 5 mg by mouth daily.   Yes [provider]  metFORMIN (GLUCOPHAGE) 500 MG tablet Take 500 mg by mouth daily.    Yes [provider]  Multiple Vitamin (MULTIVITAMIN) tablet Take 1 tablet by mouth daily.   Yes [provider]  naproxen sodium (ANAPROX) 220 MG tablet Take 220 mg by mouth continuous as needed (for pain).   Yes [provider]  Probiotic Product (ALIGN) 4 MG  CAPS Take 1 capsule by mouth daily.   Yes [provider]  tamsulosin (FLOMAX) 0.4 MG CAPS capsule Take 0.4 mg by mouth 2 (two) times daily.   Yes [provider]  ACCU-CHEK FASTCLIX LANCETS MISC by Does not apply route daily.    [provider]  lipase/protease/amylase (CREON) 36000 UNITS CPEP capsule Take 2 capsules (72,000 Units total) by mouth 3 (three) times daily with meals. Patient not taking: Reported on 08/03/2018 08/14/17   Gatha Mayer, MD    Family History Family History  Problem Relation Age of Onset  . Diabetes Mother   . Diabetes Sister   . Cancer Brother        unknown type  . Stroke Father     Social  History Social History   Tobacco Use  . Smoking status: Former Smoker    Years: 20.00    Types: Cigarettes    Quit date: 04/11/1991    Years since quitting: 27.3  . Smokeless tobacco: Never Used  Substance Use Topics  . Alcohol use: Yes    Comment: 1 can every 6 months or so/rare  . Drug use: No     Allergies   Patient has no known allergies.   Review of Systems Review of Systems  Constitutional: Positive for activity change.  Gastrointestinal: Positive for abdominal pain.  Genitourinary: Positive for difficulty urinating.  Allergic/Immunologic: Negative for immunocompromised state.     Physical Exam Updated Vital Signs BP 116/70 (BP Location: Left Arm)   Pulse 69   Temp 98.7 F (37.1 C) (Oral)   Resp 14   Wt 68 kg   SpO2 97%   BMI 24.96 kg/m   Physical Exam Vitals signs and nursing note reviewed.  Constitutional:      Appearance: He is well-developed.  HENT:     Head: Atraumatic.  Neck:     Musculoskeletal: Neck supple.  Cardiovascular:     Rate and Rhythm: Normal rate.  Pulmonary:     Effort: Pulmonary effort is normal.  Skin:    General: Skin is warm.  Neurological:     Mental Status: He is alert and oriented to person, place, and time.      ED Treatments / Results  Labs (all labs ordered are listed, but only abnormal results are displayed) Labs Reviewed  URINALYSIS, ROUTINE W REFLEX MICROSCOPIC - Abnormal; Notable for the following components:      Result Value   Hgb urine dipstick SMALL (*)    Bacteria, UA RARE (*)    All other components within normal limits  I-STAT CHEM 8, ED - Abnormal; Notable for the following components:   Glucose, Bld 160 (*)    All other components within normal limits    EKG None  Radiology No results found.  Procedures Procedures (including critical care time)  Medications Ordered in ED Medications - No data to display   Initial Impression / Assessment and Plan / ED Course  I have reviewed the  triage vital signs and the nursing notes.  Pertinent labs & imaging results that were available during my care of the patient were reviewed by me and considered in my medical decision making (see chart for details).        Pt comes in with cc of urinary retention. He has 700 cc of urinary retention - foley catheter placed. D/c with foley and follow-up instructions.   Final Clinical Impressions(s) / ED Diagnoses   Final diagnoses:  Acute urinary retention  ED Discharge Orders    None       Varney Biles, MD 08/05/18 1230

## 2018-08-03 NOTE — ED Notes (Signed)
Signature pad unavailable at this time, pt provided discharge papers and had no questions or concerns. Pt discharged without difficulty.

## 2018-08-03 NOTE — ED Provider Notes (Signed)
Patient placed in Quick Look pathway, seen and evaluated   Chief Complaint: "can't pee."  HPI:  patient has been unable to urinate since midnight. Hx of same in 2014    ROS: Urinary urgency and dribbling No flank pain or fevers (one)  Physical Exam:   Gen: No distress  Neuro: Awake and Alert  Skin: Warm    Focused Exam: Appears uncomfortable  Normal respirations   Initiation of care has begun. The patient has been counseled on the process, plan, and necessity for staying for the completion/evaluation, and the remainder of the medical screening examination    Margarita Mail, PA-C 08/03/18 1406    Dorie Rank, MD 08/04/18 [phone]

## 2018-08-03 NOTE — ED Triage Notes (Signed)
Pt states that he has been unable to void since midnight, only having "dribbles".

## 2018-08-07 DIAGNOSIS — R338 Other retention of urine: Secondary | ICD-10-CM | POA: Diagnosis not present

## 2018-09-26 DIAGNOSIS — Z23 Encounter for immunization: Secondary | ICD-10-CM | POA: Diagnosis not present

## 2018-11-24 DIAGNOSIS — M7989 Other specified soft tissue disorders: Secondary | ICD-10-CM | POA: Diagnosis not present

## 2018-11-24 DIAGNOSIS — K869 Disease of pancreas, unspecified: Secondary | ICD-10-CM | POA: Diagnosis not present

## 2018-11-24 DIAGNOSIS — K760 Fatty (change of) liver, not elsewhere classified: Secondary | ICD-10-CM | POA: Diagnosis not present

## 2018-11-24 DIAGNOSIS — E1169 Type 2 diabetes mellitus with other specified complication: Secondary | ICD-10-CM | POA: Diagnosis not present

## 2018-11-24 DIAGNOSIS — R970 Elevated carcinoembryonic antigen [CEA]: Secondary | ICD-10-CM | POA: Diagnosis not present

## 2018-11-24 DIAGNOSIS — E785 Hyperlipidemia, unspecified: Secondary | ICD-10-CM | POA: Diagnosis not present

## 2018-11-24 DIAGNOSIS — D692 Other nonthrombocytopenic purpura: Secondary | ICD-10-CM | POA: Diagnosis not present

## 2018-11-24 DIAGNOSIS — K219 Gastro-esophageal reflux disease without esophagitis: Secondary | ICD-10-CM | POA: Diagnosis not present

## 2018-11-24 DIAGNOSIS — Z87442 Personal history of urinary calculi: Secondary | ICD-10-CM | POA: Diagnosis not present

## 2018-11-24 DIAGNOSIS — L600 Ingrowing nail: Secondary | ICD-10-CM | POA: Diagnosis not present

## 2019-01-09 DIAGNOSIS — L578 Other skin changes due to chronic exposure to nonionizing radiation: Secondary | ICD-10-CM | POA: Diagnosis not present

## 2019-01-09 DIAGNOSIS — L300 Nummular dermatitis: Secondary | ICD-10-CM | POA: Diagnosis not present

## 2019-01-09 DIAGNOSIS — L570 Actinic keratosis: Secondary | ICD-10-CM | POA: Diagnosis not present

## 2019-01-09 DIAGNOSIS — L821 Other seborrheic keratosis: Secondary | ICD-10-CM | POA: Diagnosis not present

## 2019-01-11 ENCOUNTER — Encounter: Payer: Self-pay | Admitting: Podiatry

## 2019-01-12 ENCOUNTER — Encounter: Payer: Self-pay | Admitting: Podiatry

## 2019-01-12 ENCOUNTER — Ambulatory Visit: Payer: Medicare Other | Attending: Internal Medicine

## 2019-01-12 DIAGNOSIS — Z23 Encounter for immunization: Secondary | ICD-10-CM | POA: Insufficient documentation

## 2019-01-12 NOTE — Progress Notes (Signed)
   Covid-19 Vaccination Clinic  Name:  Francisco [name]    MRN: [id number] DOB: 09/07/1933  01/12/2019  [name] was observed post Covid-19 immunization for 15 minutes without incidence. He was provided with Vaccine Information Sheet and instruction to access the V-Safe system.   [name] was instructed to call 911 with any severe reactions post vaccine: Marland Kitchen Difficulty breathing  . Swelling of your face and throat  . A fast heartbeat  . A bad rash all over your body  . Dizziness and weakness    Immunizations Administered    Name Date Dose VIS Date Route   Pfizer COVID-19 Vaccine 01/12/2019  9:59 AM 0.3 mL 12/12/2018 Intramuscular   Manufacturer: [employer]   Lot: [id number]   [place]: [id number]

## 2019-01-12 NOTE — Telephone Encounter (Signed)
Left message on pt's mobile phone, that [facility] did not give immunizations and if his wife was a patient within his PCP office he could schedule with [phone]. 8:36am - Pt called and states someone tried to call him a few seconds ago. I informed pt that I had called to give him the phone number to his PCP to possibly schedule his wife for the [place] immunization. Pt states he is over at the [facility] and to forget about it.

## 2019-01-26 ENCOUNTER — Encounter: Payer: Self-pay | Admitting: Podiatry

## 2019-01-30 ENCOUNTER — Ambulatory Visit: Payer: PPO

## 2019-01-31 ENCOUNTER — Ambulatory Visit: Payer: PPO | Attending: Internal Medicine

## 2019-01-31 DIAGNOSIS — Z23 Encounter for immunization: Secondary | ICD-10-CM | POA: Insufficient documentation

## 2019-01-31 NOTE — Progress Notes (Signed)
   Covid-19 Vaccination Clinic  Name:  Francisco [name]    MRN: [id number] DOB: 1933/07/31  01/31/2019  [name] was observed post Covid-19 immunization for 15 minutes without incidence. He was provided with Vaccine Information Sheet and instruction to access the V-Safe system.   [name] was instructed to call 911 with any severe reactions post vaccine: Marland Kitchen Difficulty breathing  . Swelling of your face and throat  . A fast heartbeat  . A bad rash all over your body  . Dizziness and weakness    Immunizations Administered    Name Date Dose VIS Date Route   Pfizer COVID-19 Vaccine 01/31/2019 11:25 AM 0.3 mL 12/12/2018 Intramuscular   Manufacturer: [place]   Lot: [id number]   [place]: [id number]

## 2019-02-20 ENCOUNTER — Encounter: Payer: Self-pay | Admitting: Podiatry

## 2019-03-10 DIAGNOSIS — Z23 Encounter for immunization: Secondary | ICD-10-CM | POA: Diagnosis not present

## 2019-03-10 DIAGNOSIS — D3A8 Other benign neuroendocrine tumors: Secondary | ICD-10-CM | POA: Diagnosis not present

## 2019-03-10 DIAGNOSIS — N400 Enlarged prostate without lower urinary tract symptoms: Secondary | ICD-10-CM | POA: Diagnosis not present

## 2019-03-10 DIAGNOSIS — D136 Benign neoplasm of pancreas: Secondary | ICD-10-CM | POA: Diagnosis not present

## 2019-03-10 DIAGNOSIS — D490 Neoplasm of unspecified behavior of digestive system: Secondary | ICD-10-CM | POA: Diagnosis not present

## 2019-03-10 DIAGNOSIS — K862 Cyst of pancreas: Secondary | ICD-10-CM | POA: Diagnosis not present

## 2019-03-10 DIAGNOSIS — R970 Elevated carcinoembryonic antigen [CEA]: Secondary | ICD-10-CM | POA: Diagnosis not present

## 2019-03-10 DIAGNOSIS — Z87891 Personal history of nicotine dependence: Secondary | ICD-10-CM | POA: Diagnosis not present

## 2019-03-17 ENCOUNTER — Encounter: Payer: Self-pay | Admitting: Podiatry

## 2019-03-17 ENCOUNTER — Other Ambulatory Visit: Payer: Self-pay

## 2019-03-17 ENCOUNTER — Ambulatory Visit (INDEPENDENT_AMBULATORY_CARE_PROVIDER_SITE_OTHER): Payer: PPO | Admitting: Podiatry

## 2019-03-17 VITALS — Temp 95.8°F | Resp 14

## 2019-03-17 DIAGNOSIS — M79674 Pain in right toe(s): Secondary | ICD-10-CM

## 2019-03-17 DIAGNOSIS — B351 Tinea unguium: Secondary | ICD-10-CM | POA: Diagnosis not present

## 2019-03-17 DIAGNOSIS — E119 Type 2 diabetes mellitus without complications: Secondary | ICD-10-CM | POA: Diagnosis not present

## 2019-03-17 DIAGNOSIS — M79675 Pain in left toe(s): Secondary | ICD-10-CM

## 2019-03-17 DIAGNOSIS — Q828 Other specified congenital malformations of skin: Secondary | ICD-10-CM | POA: Diagnosis not present

## 2019-03-18 NOTE — Progress Notes (Signed)
Subjective: 86 y.o. returns the office today for painful, elongated, thickened toenails which he cannot trim himself as well as for a callus on the left foot. Denies any redness, drainage, swelling that he reports.  No other concerns. Denies any systemic complaints such as fevers, chills, nausea, vomiting.   PCP: Burnard Bunting, MD  Objective: AAO 3, NAD DP/PT pulses palpable, CRT less than 3 seconds Nails hypertrophic, dystrophic, elongated, brittle, discolored 10. There is tenderness overlying the nails 1-5 bilaterally. There is no surrounding erythema or drainage along the nail sites. The nails are thick and ingrown as well. They have not been trimmed in some time.  Hyperkeratotic tissue left foot. No ulcerations, drainage or signs of infection.  No other areas of tenderness bilateral lower extremities. No overlying edema, erythema, increased warmth. No pain with calf compression, swelling, warmth, erythema.  Assessment: Patient presents with symptomatic onychomycosis; hyperkeratotic lesion  Plan: -Treatment options including alternatives, risks, complications were discussed -Nails sharply debrided 10 without complication/bleeding. -Sharply debrided hyperkeratotic lesion without any complications or bleeding.  -Discussed daily foot inspection. If there are any changes, to call the office immediately.  -Follow-up in 3 months or as needed if any problems are to arise.   Celesta Gentile, DPM

## 2019-03-22 DIAGNOSIS — Z01812 Encounter for preprocedural laboratory examination: Secondary | ICD-10-CM | POA: Diagnosis not present

## 2019-03-23 DIAGNOSIS — D123 Benign neoplasm of transverse colon: Secondary | ICD-10-CM | POA: Diagnosis not present

## 2019-03-23 DIAGNOSIS — N189 Chronic kidney disease, unspecified: Secondary | ICD-10-CM | POA: Diagnosis not present

## 2019-03-23 DIAGNOSIS — D122 Benign neoplasm of ascending colon: Secondary | ICD-10-CM | POA: Diagnosis not present

## 2019-03-23 DIAGNOSIS — Z79899 Other long term (current) drug therapy: Secondary | ICD-10-CM | POA: Diagnosis not present

## 2019-03-23 DIAGNOSIS — E119 Type 2 diabetes mellitus without complications: Secondary | ICD-10-CM | POA: Diagnosis not present

## 2019-03-23 DIAGNOSIS — K219 Gastro-esophageal reflux disease without esophagitis: Secondary | ICD-10-CM | POA: Diagnosis not present

## 2019-03-23 DIAGNOSIS — Z1211 Encounter for screening for malignant neoplasm of colon: Secondary | ICD-10-CM | POA: Diagnosis not present

## 2019-03-23 DIAGNOSIS — J45909 Unspecified asthma, uncomplicated: Secondary | ICD-10-CM | POA: Diagnosis not present

## 2019-03-23 DIAGNOSIS — K648 Other hemorrhoids: Secondary | ICD-10-CM | POA: Diagnosis not present

## 2019-03-23 DIAGNOSIS — Z7984 Long term (current) use of oral hypoglycemic drugs: Secondary | ICD-10-CM | POA: Diagnosis not present

## 2019-03-23 DIAGNOSIS — E1122 Type 2 diabetes mellitus with diabetic chronic kidney disease: Secondary | ICD-10-CM | POA: Diagnosis not present

## 2019-03-23 DIAGNOSIS — Z86010 Personal history of colonic polyps: Secondary | ICD-10-CM | POA: Diagnosis not present

## 2019-03-23 DIAGNOSIS — Z7982 Long term (current) use of aspirin: Secondary | ICD-10-CM | POA: Diagnosis not present

## 2019-03-23 DIAGNOSIS — R634 Abnormal weight loss: Secondary | ICD-10-CM | POA: Diagnosis not present

## 2019-03-25 DIAGNOSIS — R799 Abnormal finding of blood chemistry, unspecified: Secondary | ICD-10-CM | POA: Diagnosis not present

## 2019-03-25 DIAGNOSIS — N400 Enlarged prostate without lower urinary tract symptoms: Secondary | ICD-10-CM | POA: Diagnosis not present

## 2019-03-25 DIAGNOSIS — Z7984 Long term (current) use of oral hypoglycemic drugs: Secondary | ICD-10-CM | POA: Diagnosis not present

## 2019-03-25 DIAGNOSIS — Z90411 Acquired partial absence of pancreas: Secondary | ICD-10-CM | POA: Diagnosis not present

## 2019-03-25 DIAGNOSIS — K862 Cyst of pancreas: Secondary | ICD-10-CM | POA: Diagnosis not present

## 2019-03-25 DIAGNOSIS — Z7982 Long term (current) use of aspirin: Secondary | ICD-10-CM | POA: Diagnosis not present

## 2019-03-25 DIAGNOSIS — K3189 Other diseases of stomach and duodenum: Secondary | ICD-10-CM | POA: Diagnosis not present

## 2019-03-25 DIAGNOSIS — E119 Type 2 diabetes mellitus without complications: Secondary | ICD-10-CM | POA: Diagnosis not present

## 2019-03-25 DIAGNOSIS — R932 Abnormal findings on diagnostic imaging of liver and biliary tract: Secondary | ICD-10-CM | POA: Diagnosis not present

## 2019-03-25 DIAGNOSIS — K228 Other specified diseases of esophagus: Secondary | ICD-10-CM | POA: Diagnosis not present

## 2019-03-25 DIAGNOSIS — D136 Benign neoplasm of pancreas: Secondary | ICD-10-CM | POA: Diagnosis not present

## 2019-03-25 DIAGNOSIS — K8689 Other specified diseases of pancreas: Secondary | ICD-10-CM | POA: Diagnosis not present

## 2019-03-25 DIAGNOSIS — K219 Gastro-esophageal reflux disease without esophagitis: Secondary | ICD-10-CM | POA: Diagnosis not present

## 2019-03-25 DIAGNOSIS — Z980 Intestinal bypass and anastomosis status: Secondary | ICD-10-CM | POA: Diagnosis not present

## 2019-03-25 DIAGNOSIS — R634 Abnormal weight loss: Secondary | ICD-10-CM | POA: Diagnosis not present

## 2019-03-25 DIAGNOSIS — D490 Neoplasm of unspecified behavior of digestive system: Secondary | ICD-10-CM | POA: Diagnosis not present

## 2019-03-25 DIAGNOSIS — Z9049 Acquired absence of other specified parts of digestive tract: Secondary | ICD-10-CM | POA: Diagnosis not present

## 2019-03-25 DIAGNOSIS — E1122 Type 2 diabetes mellitus with diabetic chronic kidney disease: Secondary | ICD-10-CM | POA: Diagnosis not present

## 2019-03-25 DIAGNOSIS — N189 Chronic kidney disease, unspecified: Secondary | ICD-10-CM | POA: Diagnosis not present

## 2019-03-25 DIAGNOSIS — K2270 Barrett's esophagus without dysplasia: Secondary | ICD-10-CM | POA: Diagnosis not present

## 2019-04-01 DIAGNOSIS — E1169 Type 2 diabetes mellitus with other specified complication: Secondary | ICD-10-CM | POA: Diagnosis not present

## 2019-04-01 DIAGNOSIS — E7849 Other hyperlipidemia: Secondary | ICD-10-CM | POA: Diagnosis not present

## 2019-04-01 DIAGNOSIS — Z125 Encounter for screening for malignant neoplasm of prostate: Secondary | ICD-10-CM | POA: Diagnosis not present

## 2019-04-08 DIAGNOSIS — K869 Disease of pancreas, unspecified: Secondary | ICD-10-CM | POA: Diagnosis not present

## 2019-04-08 DIAGNOSIS — R82998 Other abnormal findings in urine: Secondary | ICD-10-CM | POA: Diagnosis not present

## 2019-04-08 DIAGNOSIS — E1169 Type 2 diabetes mellitus with other specified complication: Secondary | ICD-10-CM | POA: Diagnosis not present

## 2019-04-14 DIAGNOSIS — K869 Disease of pancreas, unspecified: Secondary | ICD-10-CM | POA: Diagnosis not present

## 2019-04-14 DIAGNOSIS — E1169 Type 2 diabetes mellitus with other specified complication: Secondary | ICD-10-CM | POA: Diagnosis not present

## 2019-06-03 DIAGNOSIS — E1169 Type 2 diabetes mellitus with other specified complication: Secondary | ICD-10-CM | POA: Diagnosis not present

## 2019-06-03 DIAGNOSIS — K219 Gastro-esophageal reflux disease without esophagitis: Secondary | ICD-10-CM | POA: Diagnosis not present

## 2019-06-03 DIAGNOSIS — K869 Disease of pancreas, unspecified: Secondary | ICD-10-CM | POA: Diagnosis not present

## 2019-06-03 DIAGNOSIS — D692 Other nonthrombocytopenic purpura: Secondary | ICD-10-CM | POA: Diagnosis not present

## 2019-06-03 DIAGNOSIS — M7989 Other specified soft tissue disorders: Secondary | ICD-10-CM | POA: Diagnosis not present

## 2019-06-23 ENCOUNTER — Ambulatory Visit (INDEPENDENT_AMBULATORY_CARE_PROVIDER_SITE_OTHER): Payer: PPO | Admitting: Podiatry

## 2019-06-23 ENCOUNTER — Other Ambulatory Visit: Payer: Self-pay

## 2019-06-23 ENCOUNTER — Encounter: Payer: Self-pay | Admitting: Podiatry

## 2019-06-23 DIAGNOSIS — E119 Type 2 diabetes mellitus without complications: Secondary | ICD-10-CM | POA: Diagnosis not present

## 2019-06-23 DIAGNOSIS — B351 Tinea unguium: Secondary | ICD-10-CM

## 2019-06-23 DIAGNOSIS — Q828 Other specified congenital malformations of skin: Secondary | ICD-10-CM | POA: Diagnosis not present

## 2019-06-23 DIAGNOSIS — M79675 Pain in left toe(s): Secondary | ICD-10-CM | POA: Diagnosis not present

## 2019-06-23 DIAGNOSIS — M79674 Pain in right toe(s): Secondary | ICD-10-CM

## 2019-06-25 DIAGNOSIS — E1169 Type 2 diabetes mellitus with other specified complication: Secondary | ICD-10-CM | POA: Diagnosis not present

## 2019-06-28 NOTE — Progress Notes (Signed)
Subjective: 86 y.o. returns the office today for painful, elongated, thickened toenails which he cannot trim himself as well as for a callus on the left foot.  No new concerns today.  Denies any redness, drainage, swelling that he reports.   Denies any systemic complaints such as fevers, chills, nausea, vomiting.   PCP: Burnard Bunting, MD  Objective: AAO 3, NAD DP/PT pulses palpable, CRT less than 3 seconds Nails hypertrophic, dystrophic, elongated, brittle, discolored 10. There is tenderness overlying the nails 1-5 bilaterally. There is no surrounding erythema or drainage along the nail sites. The nails are thick and ingrown as well. They have not been trimmed in some time.  Hyperkeratotic tissue left foot. No ulcerations, drainage or signs of infection.  No other areas of tenderness bilateral lower extremities. No overlying edema, erythema, increased warmth. No pain with calf compression, swelling, warmth, erythema.  Assessment: Patient presents with symptomatic onychomycosis; hyperkeratotic lesion  Plan: -Treatment options including alternatives, risks, complications were discussed -Nails sharply debrided 10 without complication/bleeding. -Sharply debrided hyperkeratotic lesion x1 without any complications or bleeding.  -Discussed daily foot inspection. If there are any changes, to call the office immediately.  -Follow-up in 3 months or as needed if any problems are to arise.   Celesta Gentile, DPM

## 2019-07-08 DIAGNOSIS — K22710 Barrett's esophagus with low grade dysplasia: Secondary | ICD-10-CM | POA: Diagnosis not present

## 2019-07-08 DIAGNOSIS — K449 Diaphragmatic hernia without obstruction or gangrene: Secondary | ICD-10-CM | POA: Diagnosis not present

## 2019-07-08 DIAGNOSIS — N189 Chronic kidney disease, unspecified: Secondary | ICD-10-CM | POA: Diagnosis not present

## 2019-07-08 DIAGNOSIS — Z79899 Other long term (current) drug therapy: Secondary | ICD-10-CM | POA: Diagnosis not present

## 2019-07-08 DIAGNOSIS — K2270 Barrett's esophagus without dysplasia: Secondary | ICD-10-CM | POA: Diagnosis not present

## 2019-07-08 DIAGNOSIS — E1122 Type 2 diabetes mellitus with diabetic chronic kidney disease: Secondary | ICD-10-CM | POA: Diagnosis not present

## 2019-07-08 DIAGNOSIS — Z980 Intestinal bypass and anastomosis status: Secondary | ICD-10-CM | POA: Diagnosis not present

## 2019-07-08 DIAGNOSIS — Z7982 Long term (current) use of aspirin: Secondary | ICD-10-CM | POA: Diagnosis not present

## 2019-07-08 DIAGNOSIS — Z87891 Personal history of nicotine dependence: Secondary | ICD-10-CM | POA: Diagnosis not present

## 2019-07-08 DIAGNOSIS — K22711 Barrett's esophagus with high grade dysplasia: Secondary | ICD-10-CM | POA: Diagnosis not present

## 2019-07-08 DIAGNOSIS — E119 Type 2 diabetes mellitus without complications: Secondary | ICD-10-CM | POA: Diagnosis not present

## 2019-07-08 DIAGNOSIS — Z9889 Other specified postprocedural states: Secondary | ICD-10-CM | POA: Diagnosis not present

## 2019-07-08 DIAGNOSIS — K3189 Other diseases of stomach and duodenum: Secondary | ICD-10-CM | POA: Diagnosis not present

## 2019-07-08 DIAGNOSIS — K2100 Gastro-esophageal reflux disease with esophagitis, without bleeding: Secondary | ICD-10-CM | POA: Diagnosis not present

## 2019-07-17 DIAGNOSIS — L821 Other seborrheic keratosis: Secondary | ICD-10-CM | POA: Diagnosis not present

## 2019-07-17 DIAGNOSIS — L578 Other skin changes due to chronic exposure to nonionizing radiation: Secondary | ICD-10-CM | POA: Diagnosis not present

## 2019-07-17 DIAGNOSIS — L570 Actinic keratosis: Secondary | ICD-10-CM | POA: Diagnosis not present

## 2019-08-12 DIAGNOSIS — N132 Hydronephrosis with renal and ureteral calculous obstruction: Secondary | ICD-10-CM | POA: Diagnosis not present

## 2019-08-12 DIAGNOSIS — N134 Hydroureter: Secondary | ICD-10-CM | POA: Diagnosis not present

## 2019-08-12 DIAGNOSIS — N400 Enlarged prostate without lower urinary tract symptoms: Secondary | ICD-10-CM | POA: Diagnosis not present

## 2019-08-12 DIAGNOSIS — E1169 Type 2 diabetes mellitus with other specified complication: Secondary | ICD-10-CM | POA: Diagnosis not present

## 2019-08-12 DIAGNOSIS — N201 Calculus of ureter: Secondary | ICD-10-CM | POA: Diagnosis not present

## 2019-08-12 DIAGNOSIS — K7689 Other specified diseases of liver: Secondary | ICD-10-CM | POA: Diagnosis not present

## 2019-08-12 DIAGNOSIS — R310 Gross hematuria: Secondary | ICD-10-CM | POA: Diagnosis not present

## 2019-08-12 DIAGNOSIS — I700 Atherosclerosis of aorta: Secondary | ICD-10-CM | POA: Diagnosis not present

## 2019-08-17 ENCOUNTER — Other Ambulatory Visit: Payer: Self-pay | Admitting: Urology

## 2019-08-17 NOTE — Progress Notes (Signed)
COVID Vaccine Completed: Date COVID Vaccine completed: COVID vaccine manufacturer: [place]   PCP -  Cardiologist -   Chest x-ray -  EKG -  Stress Test -  ECHO -  Cardiac Cath -   Sleep Study -  CPAP -   Fasting Blood Sugar -  Checks Blood Sugar _____ times a day  Blood Thinner Instructions: Aspirin Instructions: 81 mg  Last Dose:  Anesthesia review:   Patient denies shortness of breath, fever, cough and chest pain at PAT appointment   Patient verbalized understanding of instructions that were given to them at the PAT appointment. Patient was also instructed that they will need to review over the PAT instructions again at home before surgery.

## 2019-08-17 NOTE — Patient Instructions (Addendum)
DUE TO COVID-19 ONLY ONE VISITOR IS ALLOWED TO COME WITH YOU AND STAY IN THE WAITING ROOM ONLY DURING PRE OP AND PROCEDURE DAY OF SURGERY. THE 1 VISITOR  MAY VISIT WITH YOU AFTER SURGERY IN YOUR PRIVATE ROOM DURING VISITING HOURS ONLY!  YOU NEED TO HAVE A COVID 19 TEST ON 08/18/19 @ 10:25 AM_, THIS TEST MUST BE DONE BEFORE SURGERY,  COVID TESTING SITE [address] [place] [address], IT IS ON THE RIGHT GOING OUT [address] APPROXIMATELY  2 MINUTES PAST [employer] ON THE RIGHT. ONCE YOUR COVID TEST IS COMPLETED,  PLEASE BEGIN THE QUARANTINE INSTRUCTIONS AS OUTLINED IN YOUR HANDOUT.                Francisco [name]  08/17/2019   Your procedure is scheduled on: 08-19-19   Report to [facility] Main  Entrance    Report to Admitting at 6:30 AM     Call this number if you have problems the morning of surgery [phone]    Remember: Do not eat food or drink liquids :After Midnight.     Take these medicines the morning of surgery with A SIP OF WATER: None-per pt's preference  BRUSH YOUR TEETH MORNING OF SURGERY AND RINSE YOUR MOUTH OUT, NO CHEWING GUM CANDY OR MINTS.   DO NOT TAKE ANY DIABETIC MEDICATIONS DAY OF YOUR SURGERY                               You may not have any metal on your body including hair pins and              piercings     Do not wear jewelry, cologne, lotions, powders or deodorant                        Men may shave face and neck.   Do not bring valuables to the hospital. [place].  Contacts, dentures or bridgework may not be worn into surgery.       Patients discharged the day of surgery will not be allowed to drive home. IF YOU ARE HAVING SURGERY AND GOING HOME THE SAME DAY, YOU MUST HAVE AN ADULT TO DRIVE YOU HOME AND BE WITH YOU FOR 24 HOURS. YOU MAY GO HOME BY TAXI OR UBER OR ORTHERWISE, BUT AN ADULT MUST ACCOMPANY YOU HOME AND STAY WITH YOU FOR 24 HOURS.  Name and phone  number of your driver:Jeanette Cabreja [phone]                Please read over the following fact sheets you were given: _____________________________________________________________________             How to Manage Your Diabetes Before and After Surgery  Why is it important to control my blood sugar before and after surgery?  Improving blood sugar levels before and after surgery helps healing and can limit problems.  A way of improving blood sugar control is eating a healthy diet by: o  Eating less sugar and carbohydrates o  Increasing activity/exercise o  Talking with your doctor about reaching your blood sugar goals  High blood sugars (greater than 180 mg/dL) can raise your risk of infections and slow your recovery, so you will need to focus on controlling your  diabetes during the weeks before surgery.  Make sure that the doctor who takes care of your diabetes knows about your planned surgery including the date and location.  How do I manage my blood sugar before surgery?  Check your blood sugar at least 4 times a day, starting 2 days before surgery, to make sure that the level is not too high or low. o Check your blood sugar the morning of your surgery when you wake up and every 2 hours until you get to the Short Stay unit.  If your blood sugar is less than 70 mg/dL, you will need to treat for low blood sugar: o Do not take insulin. o Treat a low blood sugar (less than 70 mg/dL) with  cup of clear juice (cranberry or apple), 4 glucose tablets, OR glucose gel. o Recheck blood sugar in 15 minutes after treatment (to make sure it is greater than 70 mg/dL). If your blood sugar is not greater than 70 mg/dL on recheck, call [phone] for further instructions.  Report your blood sugar to the short stay nurse when you get to Short Stay.   If you are admitted to the hospital after surgery: o Your blood sugar will be checked by the staff and you will probably be given  insulin after surgery (instead of oral diabetes medicines) to make sure you have good blood sugar levels. o The goal for blood sugar control after surgery is 80-180 mg/dL.   WHAT DO I DO ABOUT MY DIABETES MEDICATION?   Do not take oral diabetes medicines (pills) the morning of surgery.   THE DAY BEFORE SURGERY,  DO NOT TAKE YOUR JARDIANCE .           [place] - Preparing for Surgery Before surgery, you can play an important role.  Because skin is not sterile, your skin needs to be as free of germs as possible.  You can reduce the number of germs on your skin by washing with CHG (chlorahexidine gluconate) soap before surgery.  CHG is an antiseptic cleaner which kills germs and bonds with the skin to continue killing germs even after washing. Please DO NOT use if you have an allergy to CHG or antibacterial soaps.  If your skin becomes reddened/irritated stop using the CHG and inform your nurse when you arrive at Short Stay. Do not shave (including legs and underarms) for at least 48 hours prior to the first CHG shower.  You may shave your face/neck. Please follow these instructions carefully:  1.  Shower with CHG Soap the night before surgery and the  morning of Surgery.  2.  If you choose to wash your hair, wash your hair first as usual with your  normal  shampoo.  3.  After you shampoo, rinse your hair and body thoroughly to remove the  shampoo.                           4.  Use CHG as you would any other liquid soap.  You can apply chg directly  to the skin and wash                       Gently with a scrungie or clean washcloth.  5.  Apply the CHG Soap to your body ONLY FROM THE NECK DOWN.   Do not use on face/ open  Wound or open sores. Avoid contact with eyes, ears mouth and genitals (private parts).                       Wash face,  Genitals (private parts) with your normal soap.             6.  Wash thoroughly, paying special attention to the area where  your surgery  will be performed.  7.  Thoroughly rinse your body with warm water from the neck down.  8.  DO NOT shower/wash with your normal soap after using and rinsing off  the CHG Soap.                9.  Pat yourself dry with a clean towel.            10.  Wear clean pajamas.            11.  Place clean sheets on your bed the night of your first shower and do not  sleep with pets. Day of Surgery : Do not apply any lotions/deodorants the morning of surgery.  Please wear clean clothes to the hospital/surgery center.  FAILURE TO FOLLOW THESE INSTRUCTIONS MAY RESULT IN THE CANCELLATION OF YOUR SURGERY PATIENT SIGNATURE_________________________________  NURSE SIGNATURE__________________________________  ________________________________________________________________________

## 2019-08-17 NOTE — Progress Notes (Signed)
Please place surgery orders. Pt scheduled for his PAT on 08-18-19. Thank you

## 2019-08-18 ENCOUNTER — Other Ambulatory Visit: Payer: Self-pay

## 2019-08-18 ENCOUNTER — Encounter (HOSPITAL_COMMUNITY): Payer: Self-pay

## 2019-08-18 ENCOUNTER — Encounter (HOSPITAL_COMMUNITY)
Admission: RE | Admit: 2019-08-18 | Discharge: 2019-08-18 | Disposition: A | Discharge status: Home / Self Care | Payer: PPO | Source: Ambulatory Visit | Attending: Urology | Admitting: Urology

## 2019-08-18 ENCOUNTER — Other Ambulatory Visit (HOSPITAL_COMMUNITY)
Admission: RE | Admit: 2019-08-18 | Discharge: 2019-08-18 | Disposition: A | Discharge status: Home / Self Care | Payer: PPO | Source: Ambulatory Visit | Attending: Urology | Admitting: Urology

## 2019-08-18 DIAGNOSIS — R001 Bradycardia, unspecified: Secondary | ICD-10-CM | POA: Insufficient documentation

## 2019-08-18 DIAGNOSIS — Z01812 Encounter for preprocedural laboratory examination: Secondary | ICD-10-CM | POA: Insufficient documentation

## 2019-08-18 DIAGNOSIS — E119 Type 2 diabetes mellitus without complications: Secondary | ICD-10-CM | POA: Diagnosis not present

## 2019-08-18 DIAGNOSIS — Z20822 Contact with and (suspected) exposure to covid-19: Secondary | ICD-10-CM | POA: Insufficient documentation

## 2019-08-18 DIAGNOSIS — Z01810 Encounter for preprocedural cardiovascular examination: Secondary | ICD-10-CM | POA: Insufficient documentation

## 2019-08-18 LAB — GLUCOSE, CAPILLARY: Glucose-Capillary: 165 mg/dL — ABNORMAL HIGH (ref 70–99)

## 2019-08-18 LAB — BASIC METABOLIC PANEL
Anion gap: 9 (ref 5–15)
BUN: 10 mg/dL (ref 8–23)
CO2: 24 mmol/L (ref 22–32)
Calcium: 8.7 mg/dL — ABNORMAL LOW (ref 8.9–10.3)
Chloride: 101 mmol/L (ref 98–111)
Creatinine, Ser: 0.82 mg/dL (ref 0.61–1.24)
GFR calc Af Amer: >60 mL/min (ref >60)
GFR calc non Af Amer: >60 mL/min (ref >60)
Glucose, Bld: 176 mg/dL — ABNORMAL HIGH (ref 70–99)
Potassium: 4.5 mmol/L (ref 3.5–5.1)
Sodium: 134 mmol/L — ABNORMAL LOW (ref 135–145)

## 2019-08-18 LAB — CBC
HCT: 41.8 % (ref 39.0–52.0)
Hemoglobin: 13.2 g/dL (ref 13.0–17.0)
MCH: 28.4 pg (ref 26.0–34.0)
MCHC: 31.6 g/dL (ref 30.0–36.0)
MCV: 90.1 fL (ref 80.0–100.0)
Platelets: 364 10*3/uL (ref 150–400)
RBC: 4.64 MIL/uL (ref 4.22–5.81)
RDW: 13.8 % (ref 11.5–15.5)
WBC: 7.1 10*3/uL (ref 4.0–10.5)
nRBC: 0 % (ref 0.0–0.2)

## 2019-08-18 LAB — HEMOGLOBIN A1C
Hgb A1c MFr Bld: 8.3 % — ABNORMAL HIGH (ref 4.8–5.6)
Mean Plasma Glucose: 191.51 mg/dL

## 2019-08-18 LAB — SARS CORONAVIRUS 2 (TAT 6-24 HRS): SARS Coronavirus 2: NEGATIVE

## 2019-08-18 NOTE — Progress Notes (Signed)
COVID Vaccine Completed: Yes Date COVID Vaccine completed: COVID vaccine manufacturer: Pfizer    Moderna   [employer]   PCP - Dr. Maryella Shivers [facility]  Cardiologist - N/A  Chest x-ray -  EKG - 08-18-19 Stress Test -  ECHO -  Cardiac Cath -   Sleep Study -  CPAP -   Fasting Blood Sugar - 165 Checks Blood Sugar Daily  Blood Thinner Instructions: Aspirin Instructions: 81 mg  Last Dose: Was not told to hold ASA  Anesthesia review:   Patient denies shortness of breath, fever, cough and chest pain at PAT appointment   Patient verbalized understanding of instructions that were given to them at the PAT appointment. Patient was also instructed that they will need to review over the PAT instructions again at home before surgery.

## 2019-08-19 ENCOUNTER — Ambulatory Visit (HOSPITAL_COMMUNITY)
Admission: RE | Admit: 2019-08-19 | Discharge: 2019-08-19 | Disposition: A | Discharge status: Home / Self Care | Payer: PPO | Attending: Urology | Admitting: Urology

## 2019-08-19 ENCOUNTER — Ambulatory Visit (HOSPITAL_COMMUNITY): Payer: PPO | Admitting: Registered Nurse

## 2019-08-19 ENCOUNTER — Encounter (HOSPITAL_COMMUNITY): Payer: Self-pay | Admitting: Urology

## 2019-08-19 ENCOUNTER — Ambulatory Visit (HOSPITAL_COMMUNITY): Payer: PPO

## 2019-08-19 ENCOUNTER — Encounter (HOSPITAL_COMMUNITY)
Admission: RE | Disposition: A | Discharge status: Home / Self Care | Payer: Self-pay | Source: Home / Self Care | Attending: Urology

## 2019-08-19 DIAGNOSIS — K219 Gastro-esophageal reflux disease without esophagitis: Secondary | ICD-10-CM | POA: Diagnosis not present

## 2019-08-19 DIAGNOSIS — Z7982 Long term (current) use of aspirin: Secondary | ICD-10-CM | POA: Insufficient documentation

## 2019-08-19 DIAGNOSIS — N202 Calculus of kidney with calculus of ureter: Secondary | ICD-10-CM | POA: Diagnosis not present

## 2019-08-19 DIAGNOSIS — Z79899 Other long term (current) drug therapy: Secondary | ICD-10-CM | POA: Insufficient documentation

## 2019-08-19 DIAGNOSIS — Z7984 Long term (current) use of oral hypoglycemic drugs: Secondary | ICD-10-CM | POA: Insufficient documentation

## 2019-08-19 DIAGNOSIS — J45909 Unspecified asthma, uncomplicated: Secondary | ICD-10-CM | POA: Diagnosis not present

## 2019-08-19 DIAGNOSIS — N401 Enlarged prostate with lower urinary tract symptoms: Secondary | ICD-10-CM | POA: Diagnosis not present

## 2019-08-19 DIAGNOSIS — E119 Type 2 diabetes mellitus without complications: Secondary | ICD-10-CM | POA: Diagnosis not present

## 2019-08-19 DIAGNOSIS — Z87891 Personal history of nicotine dependence: Secondary | ICD-10-CM | POA: Diagnosis not present

## 2019-08-19 DIAGNOSIS — E785 Hyperlipidemia, unspecified: Secondary | ICD-10-CM | POA: Diagnosis not present

## 2019-08-19 HISTORY — PX: HOLMIUM LASER APPLICATION: SHX5852

## 2019-08-19 HISTORY — PX: CYSTOSCOPY WITH RETROGRADE PYELOGRAM, URETEROSCOPY AND STENT PLACEMENT: SHX5789

## 2019-08-19 LAB — GLUCOSE, CAPILLARY: Glucose-Capillary: 135 mg/dL — ABNORMAL HIGH (ref 70–99)

## 2019-08-19 SURGERY — CYSTOURETEROSCOPY, WITH RETROGRADE PYELOGRAM AND STENT INSERTION
Anesthesia: General | Site: Ureter | Laterality: Right

## 2019-08-19 MED ORDER — TRAMADOL HCL 50 MG PO TABS: 50.0000 mg | ORAL_TABLET | Freq: Four times a day (QID) | ORAL | 0 refills | Status: DC | PRN

## 2019-08-19 MED ORDER — LIDOCAINE 2% (20 MG/ML) 5 ML SYRINGE
INTRAMUSCULAR | Status: AC
  Filled 2019-08-19: qty 5

## 2019-08-19 MED ORDER — LIDOCAINE 2% (20 MG/ML) 5 ML SYRINGE
INTRAMUSCULAR | Status: DC | PRN
  Administered 2019-08-19: 40 mg via INTRAVENOUS

## 2019-08-19 MED ORDER — GENTAMICIN SULFATE 40 MG/ML IJ SOLN
5.0000 mg/kg | INTRAVENOUS | Status: AC
  Administered 2019-08-19: 310 mg via INTRAVENOUS
  Filled 2019-08-19: qty 7.75

## 2019-08-19 MED ORDER — FENTANYL CITRATE (PF) 100 MCG/2ML IJ SOLN
INTRAMUSCULAR | Status: AC
  Filled 2019-08-19: qty 2

## 2019-08-19 MED ORDER — ONDANSETRON HCL 4 MG/2ML IJ SOLN
INTRAMUSCULAR | Status: AC
  Filled 2019-08-19: qty 2

## 2019-08-19 MED ORDER — CHLORHEXIDINE GLUCONATE 0.12 % MT SOLN
15.0000 mL | Freq: Once | OROMUCOSAL | Status: AC
  Administered 2019-08-19: 15 mL via OROMUCOSAL

## 2019-08-19 MED ORDER — ONDANSETRON HCL 4 MG/2ML IJ SOLN
INTRAMUSCULAR | Status: DC | PRN
  Administered 2019-08-19: 4 mg via INTRAVENOUS

## 2019-08-19 MED ORDER — ORAL CARE MOUTH RINSE: 15.0000 mL | Freq: Once | OROMUCOSAL | Status: AC

## 2019-08-19 MED ORDER — EPHEDRINE SULFATE-NACL 50-0.9 MG/10ML-% IV SOSY
PREFILLED_SYRINGE | INTRAVENOUS | Status: DC | PRN
  Administered 2019-08-19: 10 mg via INTRAVENOUS
  Administered 2019-08-19: 15 mg via INTRAVENOUS
  Administered 2019-08-19: 10 mg via INTRAVENOUS

## 2019-08-19 MED ORDER — EPHEDRINE 5 MG/ML INJ
INTRAVENOUS | Status: AC
  Filled 2019-08-19: qty 10

## 2019-08-19 MED ORDER — PHENYLEPHRINE 40 MCG/ML (10ML) SYRINGE FOR IV PUSH (FOR BLOOD PRESSURE SUPPORT)
PREFILLED_SYRINGE | INTRAVENOUS | Status: DC | PRN
  Administered 2019-08-19: 120 ug via INTRAVENOUS

## 2019-08-19 MED ORDER — ACETAMINOPHEN 500 MG PO TABS
1000.0000 mg | ORAL_TABLET | Freq: Once | ORAL | Status: AC
  Administered 2019-08-19: 1000 mg via ORAL
  Filled 2019-08-19: qty 2

## 2019-08-19 MED ORDER — PROPOFOL 10 MG/ML IV BOLUS
INTRAVENOUS | Status: DC | PRN
  Administered 2019-08-19: 130 mg via INTRAVENOUS

## 2019-08-19 MED ORDER — SODIUM CHLORIDE 0.9 % IR SOLN
Status: DC | PRN
  Administered 2019-08-19: 12000 mL

## 2019-08-19 MED ORDER — LACTATED RINGERS IV SOLN: INTRAVENOUS | Status: DC

## 2019-08-19 MED ORDER — DEXAMETHASONE SODIUM PHOSPHATE 10 MG/ML IJ SOLN
INTRAMUSCULAR | Status: DC | PRN
  Administered 2019-08-19: 5 mg via INTRAVENOUS

## 2019-08-19 MED ORDER — FENTANYL CITRATE (PF) 100 MCG/2ML IJ SOLN: 25.0000 ug | INTRAMUSCULAR | Status: DC | PRN

## 2019-08-19 MED ORDER — KETOROLAC TROMETHAMINE 10 MG PO TABS: 10.0000 mg | ORAL_TABLET | Freq: Three times a day (TID) | ORAL | 0 refills | Status: DC | PRN

## 2019-08-19 MED ORDER — IOHEXOL 300 MG/ML  SOLN
INTRAMUSCULAR | Status: DC | PRN
  Administered 2019-08-19: 29 mL

## 2019-08-19 MED ORDER — PHENYLEPHRINE 40 MCG/ML (10ML) SYRINGE FOR IV PUSH (FOR BLOOD PRESSURE SUPPORT)
PREFILLED_SYRINGE | INTRAVENOUS | Status: AC
  Filled 2019-08-19: qty 10

## 2019-08-19 MED ORDER — DEXAMETHASONE SODIUM PHOSPHATE 10 MG/ML IJ SOLN
INTRAMUSCULAR | Status: AC
  Filled 2019-08-19: qty 1

## 2019-08-19 MED ORDER — FENTANYL CITRATE (PF) 100 MCG/2ML IJ SOLN
INTRAMUSCULAR | Status: DC | PRN
  Administered 2019-08-19: 50 ug via INTRAVENOUS
  Administered 2019-08-19 (×2): 25 ug via INTRAVENOUS

## 2019-08-19 MED ORDER — PROPOFOL 10 MG/ML IV BOLUS
INTRAVENOUS | Status: AC
  Filled 2019-08-19: qty 20

## 2019-08-19 SURGICAL SUPPLY — 26 items
BAG URO CATCHER STRL LF (MISCELLANEOUS) ×3 IMPLANT
BASKET LASER NITINOL 1.9FR (BASKET) ×6 IMPLANT
BSKT STON RTRVL 120 1.9FR (BASKET) ×2
CATH INTERMIT  6FR 70CM (CATHETERS) ×3 IMPLANT
CATH UROLOGY TORQUE 40 (MISCELLANEOUS) ×3 IMPLANT
CLOTH BEACON ORANGE TIMEOUT ST (SAFETY) ×3 IMPLANT
EXTRACTOR STONE 1.7FRX115CM (UROLOGICAL SUPPLIES) IMPLANT
GLOVE BIOGEL M STRL SZ7.5 (GLOVE) ×3 IMPLANT
GOWN STRL REUS W/TWL LRG LVL3 (GOWN DISPOSABLE) ×3 IMPLANT
GUIDEWIRE ANG ZIPWIRE 038X150 (WIRE) ×3 IMPLANT
GUIDEWIRE STR DUAL SENSOR (WIRE) ×3 IMPLANT
KIT TURNOVER KIT A (KITS) IMPLANT
LASER FIB FLEXIVA PULSE ID 365 (Laser) IMPLANT
LASER FIB FLEXIVA PULSE ID 550 (Laser) IMPLANT
LASER FIB FLEXIVA PULSE ID 910 (Laser) IMPLANT
MANIFOLD NEPTUNE II (INSTRUMENTS) ×3 IMPLANT
PACK CYSTO (CUSTOM PROCEDURE TRAY) ×3 IMPLANT
SHEATH URETERAL 12FRX28CM (UROLOGICAL SUPPLIES) IMPLANT
SHEATH URETERAL 12FRX35CM (MISCELLANEOUS) IMPLANT
STENT POLARIS 5FRX26 (STENTS) ×3 IMPLANT
TRACTIP FLEXIVA PULS ID 200XHI (Laser) ×1 IMPLANT
TRACTIP FLEXIVA PULSE ID 200 (Laser) ×3
TUBE FEEDING 8FR 16IN STR KANG (MISCELLANEOUS) ×3 IMPLANT
TUBING CONNECTING 10 (TUBING) ×2 IMPLANT
TUBING CONNECTING 10' (TUBING) ×1
TUBING UROLOGY SET (TUBING) ×3 IMPLANT

## 2019-08-19 NOTE — Anesthesia Procedure Notes (Signed)
Procedure Name: LMA Insertion Date/Time: 08/19/2019 8:37 AM Performed by: Talbot Grumbling, CRNA Pre-anesthesia Checklist: Patient identified, Emergency Drugs available, Suction available and Patient being monitored Patient Re-evaluated:Patient Re-evaluated prior to induction Oxygen Delivery Method: Circle system utilized Preoxygenation: Pre-oxygenation with 100% oxygen Induction Type: IV induction Ventilation: Mask ventilation without difficulty LMA: LMA inserted LMA Size: 4.0 Number of attempts: 1 Placement Confirmation: positive ETCO2 Tube secured with: Tape Dental Injury: Teeth and Oropharynx as per pre-operative assessment

## 2019-08-19 NOTE — Transfer of Care (Signed)
Immediate Anesthesia Transfer of Care Note  Patient: Francisco [name]  Procedure(s) Performed: CYSTOSCOPY WITH RETROGRADE PYELOGRAM, URETEROSCOPY, LASER LITHOTRIPSY STENT PLACEMENT FIRST STAGE (Right Ureter) HOLMIUM LASER APPLICATION (Right Ureter)  Patient Location: PACU  Anesthesia Type:General  Level of Consciousness: sedated  Airway & Oxygen Therapy: Patient Spontanous Breathing and Patient connected to face mask oxygen  Post-op Assessment: Report given to RN and Post -op Vital signs reviewed and stable  Post vital signs: Reviewed and stable  Last Vitals:  Vitals Value Taken Time  BP 140/76 08/19/19 1031  Temp    Pulse 77 08/19/19 1031  Resp 11 08/19/19 1031  SpO2 100 % 08/19/19 1031  Vitals shown include unvalidated device data.  Last Pain:  Vitals:   08/19/19 0659  TempSrc:   PainSc: 0-No pain         Complications: No complications documented.

## 2019-08-19 NOTE — Discharge Instructions (Signed)
1 - You may have urinary urgency (bladder spasms) and bloody urine on / off with stent in place. This is normal. ° °2 - Call MD or go to ER for fever >102, severe pain / nausea / vomiting not relieved by medications, or acute change in medical status ° °

## 2019-08-19 NOTE — Anesthesia Postprocedure Evaluation (Signed)
Anesthesia Post Note  Patient: RYDGE TEXIDOR  Procedure(s) Performed: CYSTOSCOPY WITH RETROGRADE PYELOGRAM, URETEROSCOPY, LASER LITHOTRIPSY STENT PLACEMENT FIRST STAGE (Right Ureter) HOLMIUM LASER APPLICATION (Right Ureter)     Patient location during evaluation: PACU Anesthesia Type: General Level of consciousness: awake and alert Pain management: pain level controlled Vital Signs Assessment: post-procedure vital signs reviewed and stable Respiratory status: spontaneous breathing, nonlabored ventilation, respiratory function stable and patient connected to nasal cannula oxygen Cardiovascular status: blood pressure returned to baseline and stable Postop Assessment: no apparent nausea or vomiting Anesthetic complications: no   No complications documented.  Last Vitals:  Vitals:   08/19/19 1100 08/19/19 1108  BP: 117/71 110/66  Pulse: 78 76  Resp: 14 15  Temp:  36.7 C  SpO2: 97% 95%    Last Pain:  Vitals:   08/19/19 1108  TempSrc:   PainSc: 0-No pain                 Kaedence Connelly L Henlee Donovan

## 2019-08-19 NOTE — H&P (Signed)
Francisco [name] is an 86 y.o. male.    Chief Complaint: PRe-op RIGHT Ureteroscopic Stone Manipulation  HPI:    1 - Enlarged Prostate with Weak Urinary Stream, Urgency - manages with finasteride + BID tamsulosin. Prior TRUS 130gm TRUS 2013. NO retention. Stopped PSA screening 2013 at age 45 at which point PSA 1.68. Most bothered by nocturia x 4 now down to 1-2 with max meds.   2 - Nephrolithiasis - Rt ureteroscopy 2013 for small ureteral stone, mostly CaOx on eval gross hematuria.   3 -Simple Renal Cysts - Bosniak I bilateral renal cysts by CT and MRI 2013.   4 - Gross Hematuria - Eval 2013 with ureteral stone and BPH only.    PMH sig for benign pancreatic mass. His PCP is Burnard Bunting MD. 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 Cr 0.8, Hgb 13, C19 screen negative. Most recent UCX non-clonal. NO interval fevers.    Past Medical History:  Diagnosis Date  . Asthma with allergic rhinitis   . BPH (benign prostatic hyperplasia)   . DM (diabetes mellitus) ([place])   . Elevated CEA   . Elevated prostate specific antigen (PSA)   . Fatty liver   . GERD (gastroesophageal reflux disease)   . Hx of adenomatous colonic polyps 02/14/2016   10 adenomas max 15 mm 02/2016 - no recall at his age  . Hyperlipidemia   . Impotence of organic origin   . Neoplasm of uncertain behavior of prostate   . Pancreatic cyst FOLLOWED BY DR JACOBS  . Personal history of urinary calculi   . Renal calculi 09/19/2011  . Right ureteral stone     Past Surgical History:  Procedure Laterality Date  . CHOLECYSTECTOMY    . COLONOSCOPY W/ POLYPECTOMY  2018  . CYSTOSCOPY WITH URETEROSCOPY  03/14/2011   Procedure: CYSTOSCOPY WITH URETEROSCOPY;  Surgeon: Molli Hazard, MD;  Location: [facility];  Service: Urology;  Laterality: Right;  2 hours requested for this  case  Right Ureter Stent Placement  C-ARM CAMERA DIGITAL URETEROSCOPE    . EUS  04/12/2011   Procedure: UPPER ENDOSCOPIC ULTRASOUND (EUS) LINEAR;  Surgeon: Milus Banister, MD;  Location: [facility];  Service: Endoscopy;  Laterality: N/A;  radial linear/pt moved up a hour early by AW , Patty @ [place] office ok'd & pt was called by AW  . KIDNEY STONE SURGERY  March 2013  . PERCUTANEOUS NEPHROSTOLITHOTOMY  1985  . WHIPPLE PROCEDURE  12/2011   [facility]    Family History  Problem Relation Age of Onset  . Diabetes Mother   . Diabetes Sister   . Cancer Brother        unknown type  . Stroke Father    Social History:  reports that he quit smoking about 28 years ago. His smoking use included cigarettes. He quit after 20.00 years of use. He has never used smokeless tobacco. He reports current alcohol use. He reports that he does not use drugs.  Allergies: No Known Allergies  Medications Prior to Admission  Medication Sig Dispense Refill  . APPLE CIDER VINEGAR PO Take 1 capsule by mouth daily.     Marland Kitchen aspirin 81 MG tablet Take 81 mg by mouth every other day.     Marland Kitchen CINNAMON PO Take 2,000 mg by mouth daily.    . empagliflozin (JARDIANCE) 10 MG  TABS tablet Take 5 mg by mouth daily.    Marland Kitchen esomeprazole (NEXIUM) 40 MG capsule Take 40 mg by mouth 2 (two) times daily.    . finasteride (PROSCAR) 5 MG tablet Take 5 mg by mouth daily.    . Multiple Vitamin (MULTIVITAMIN) tablet Take 1 tablet by mouth daily.    . naproxen sodium (ANAPROX) 220 MG tablet Take 220 mg by mouth continuous as needed (for pain).    . Probiotic Product (ALIGN) 4 MG CAPS Take 4 mg by mouth daily.     . tamsulosin (FLOMAX) 0.4 MG CAPS capsule Take 0.4 mg by mouth 2 (two) times daily.    . traMADol (ULTRAM) 50 MG tablet Take 50 mg by mouth every 6 (six) hours as needed for pain.    Marland Kitchen triamcinolone cream (KENALOG) 0.1 % Apply 1 application topically 3 (three) times daily as needed (itching).    Danny Lawless FASTCLIX LANCETS MISC by  Does not apply route daily.      Results for orders placed or performed during the hospital encounter of 08/19/19 (from the past 48 hour(s))  Glucose, capillary     Status: Abnormal   Collection Time: 08/19/19  6:34 AM  Result Value Ref Range   Glucose-Capillary 135 (H) 70 - 99 mg/dL    Comment: Glucose reference range applies only to samples taken after fasting for at least 8 hours.   Comment 1 Notify RN    Comment 2 Document in Chart    No results found.  Review of Systems  Constitutional: Negative for chills and fever.  Genitourinary: Positive for flank pain and hematuria.  All other systems reviewed and are negative.   Blood pressure 134/84, pulse 79, temperature 98 F (36.7 C), temperature source Oral, resp. rate 16, weight 61.9 kg, SpO2 99 %. Physical Exam Constitutional:      Comments: Frail, but mentally very astute and pleasant.   HENT:     Head: Normocephalic.     Nose: Nose normal.  Eyes:     Pupils: Pupils are equal, round, and reactive to light.  Cardiovascular:     Rate and Rhythm: Normal rate.  Pulmonary:     Effort: Pulmonary effort is normal.  Abdominal:     General: Abdomen is flat.  Genitourinary:    Penis: Normal.      Comments: Mild Rt CVAT at present.  Musculoskeletal:        General: Normal range of motion.     Cervical back: Normal range of motion.  Skin:    General: Skin is warm.     Comments: Thinning and bruising c/w age. No hematomas. Denis blood thinner use.   Neurological:     General: No focal deficit present.     Mental Status: He is alert.  Psychiatric:        Mood and Affect: Mood normal.      Assessment/Plan  Proceed as planned with RIGHT ureteroscopy with foal of stone free. Risks, benefits, alternatives, expected peri-op course discussed previously and reiterated today.   Alexis Frock, MD 08/19/2019, 8:10 AM

## 2019-08-19 NOTE — Anesthesia Preprocedure Evaluation (Addendum)
Anesthesia Evaluation  Patient identified by MRN, date of birth, ID band Patient awake    Reviewed: Allergy & Precautions, NPO status , Patient's Chart, lab work & pertinent test results  Airway Mallampati: II  TM Distance: >3 FB Neck ROM: Full    Dental  (+) Partial Upper, Missing,    Pulmonary asthma , former smoker,    Pulmonary exam normal breath sounds clear to auscultation       Cardiovascular Normal cardiovascular exam Rhythm:Regular Rate:Normal  HLD   Neuro/Psych negative neurological ROS  negative psych ROS   GI/Hepatic Neg liver ROS, GERD  Medicated and Controlled,  Endo/Other  negative endocrine ROSdiabetes  Renal/GU Renal disease (right ureteral stone)  negative genitourinary   Musculoskeletal negative musculoskeletal ROS (+)   Abdominal   Peds  Hematology negative hematology ROS (+)   Anesthesia Other Findings   Reproductive/Obstetrics                            Anesthesia Physical Anesthesia Plan  ASA: II  Anesthesia Plan: General   Post-op Pain Management:    Induction: Intravenous  PONV Risk Score and Plan: 2 and Ondansetron, Dexamethasone and Treatment may vary due to age or medical condition  Airway Management Planned: LMA  Additional Equipment:   Intra-op Plan:   Post-operative Plan: Extubation in OR  Informed Consent: I have reviewed the patients History and Physical, chart, labs and discussed the procedure including the risks, benefits and alternatives for the proposed anesthesia with the patient or authorized representative who has indicated his/her understanding and acceptance.     Dental advisory given  Plan Discussed with: CRNA  Anesthesia Plan Comments:         Anesthesia Quick Evaluation

## 2019-08-19 NOTE — Brief Op Note (Signed)
08/19/2019  10:23 AM  PATIENT:  Francisco [name]  86 y.o. male  PRE-OPERATIVE DIAGNOSIS:  RIGHT URETERAL STONE  POST-OPERATIVE DIAGNOSIS:  RIGHT URETERAL STONE  PROCEDURE:  Procedure(s) with comments: CYSTOSCOPY WITH RETROGRADE PYELOGRAM, URETEROSCOPY, LASER LITHOTRIPSY STENT PLACEMENT FIRST STAGE (Right) - 90 MINS HOLMIUM LASER APPLICATION (Right)  SURGEON:  Surgeon(s) and Role:    * Alexis Frock, MD - Primary  PHYSICIAN ASSISTANT:   ASSISTANTS: none   ANESTHESIA:   general  EBL:  minimal   BLOOD ADMINISTERED:zero CC PRBC  DRAINS: none   LOCAL MEDICATIONS USED:  NONE  SPECIMEN:  Source of Specimen:  right ureteral and renal stone fragments  DISPOSITION OF SPECIMEN:  Given to patient  COUNTS:  YES  TOURNIQUET:  * No tourniquets in log *  DICTATION: .Other Dictation: Dictation Number  [id number]  PLAN OF CARE: Discharge to home after PACU  PATIENT DISPOSITION:  PACU - hemodynamically stable.   Delay start of Pharmacological VTE agent (>24hrs) due to surgical blood loss or risk of bleeding: yes

## 2019-08-19 NOTE — Op Note (Signed)
NAME: Francisco [name], Francisco [name] MEDICAL RECORD KN:[id number] ACCOUNT [id number] DATE OF BIRTH:[birth year] FACILITY: [facility] LOCATION: [facility] PHYSICIAN:Danny Zimny, MD  OPERATIVE REPORT  DATE OF PROCEDURE:  08/19/2019  PREOPERATIVE DIAGNOSIS:  Large right ureteral stone and renal stone.  PROCEDURE: 1.  Cystoscopy, right retrograde pyelogram interpretation. 2.  Right ureteroscopy with laser lithotripsy, 1st-stage. 3.  Placement of right ureteral stent 5 x 26 Polaris, no tether.  ESTIMATED BLOOD LOSS:  Nil.  COMPLICATIONS:  None.  SPECIMEN:  Right ureteral and renal stone fragments given to patient.  FINDINGS: 1.  Massive bilobar prostatic hypertrophy. 2.  Severe J hooking of right ureter. 3.  Removal or ablation of approximately 60% to 70% of estimated right-sided stone volume today. 4.  Successful placement of right ureteral stent.  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  He is on maximal medical therapy for enlarged prostate and has done fairly well with this.  He was found on  workup of colicky flank pain and hematuria to have large volume recurrence of right-sided urolithiasis with a large right ureteral stone in the mid to distal ureter and a lower pole stone and hydronephrosis.  Options were discussed for management  including recommended path of ureteroscopy with goal of stone free and he wished to proceed.  Given the stone volume we had discussed beforehand that this very well may require a staged approach.  Informed consent was obtained and placed in medical  record.  DESCRIPTION OF PROCEDURE:  Patient identified as patient, procedure verified as right ureteroscopic stimulation was confirmed.  Procedure timeout was performed.  Intravenous access administered.  General anesthesia induced.  The patient was placed into a  low lithotomy position, sterile field was created prepped  and draped base of the penis, perineum and proximal thighs using iodine.  Cystourethroscopy was performed using 21-French rigid cystoscope with offset lens.  Inspection of anterior and posterior  urethra revealed a massive bilobar prostatic hypertrophy as anticipated.  There was mild trabeculation of the urinary bladder.  The prostate was quite friable, also, as anticipated.  The right ureteral orifice was somewhat difficult to find given his  very large prostate; however it was found, and it was cannulated with a 6-French renal catheter and right retrograde pyelogram was obtained.  Right retrograde pyelogram demonstrated a single right ureter with single system right kidney.  There was severe J-hooking of the distal ureter and a large filling defect in the midureter consistent with known stone with mild hydronephrosis above this.   A 0.038 angle tipped ZIPwire was attempted to be guided up the right ureter; however, due to the severe J hooking, this was not successful.  As such, the open-ended catheter was exchanged for an angle tipped KMP type catheter, which the different  angulation did allow successful cannulation of the right ureter such that the ZIPwire was advanced to lower pole and set aside as a safety wire.  An 8-French feeding tube was placed in the urinary bladder for pressure release, and semirigid ureteroscopy  performed the distal right ureter alongside a separate sensor working wire.  As anticipated in the mid to distal ureter a very large stone was encountered.  It was much too large for simple basketing.  As such, holmium laser energy applied to stone using  escalating laser settings topping at 0.3 joules and 30 Hz.  Using a dusting technique, approximately 60% of stone volume was dusted with the remaining  portion then fragmented.  Inherently these fragments passed retrograde towards the area of the kidney  such that it was out of the reach of the semirigid scope.  The semirigid scope was  then exchanged for a 12/14 medium length ureteral access sheath level of the proximal ureter.  Using continuous fluoroscopic guidance, a flexible digital ureteroscopy  performed of the proximal ureter and systematic inspection of the right kidney, including all calices x3.  As anticipated, the retrograde positioning a significant stone volume fragments into the area of the kidney as well as a large lower pole stone.   Additional holmium laser energy was applied to the lower pole stone.  Similarly in an initially dusting technique, approximately 40% of the stone was dusted and 60% fragmented.  Next, an escape basket was used to begin retrieving fragments, most of which  were about 2-3 mm and as many as possible were removed, set aside for composition analysis.  Given his likely longstanding hydronephrosis and relatively poor visualization and poor angulation, I was able to remove an estimated 70% of total stone volume  today; however, it was clearly felt that a staged approach would be most beneficial with a 2nd-stage procedure in several weeks to allow for resolution of some of the tortuosity of the ureter, better visualization, and then achieving a goal of completely  stone free.  As such, the access sheath remained under continuous vision, and no mucosal abnormalities were found, and a new 5 x 26 Polaris-type stent was placed around the safety wire using fluoroscopic guidance.  Good proximal and distal plane were  noted.  The procedure was then terminated.  The patient tolerated the procedure well.  No immediate complications.  The patient was taken to postanesthesia care in stable condition.  He will have a trial of void before discharge today given his very  large prostate, low threshold for temporary catheterization.  CN/NUANCE  D:08/19/2019 T:08/19/2019 JOB:[id number]

## 2019-08-20 ENCOUNTER — Encounter (HOSPITAL_COMMUNITY): Payer: Self-pay | Admitting: Urology

## 2019-08-20 ENCOUNTER — Other Ambulatory Visit: Payer: Self-pay | Admitting: Urology

## 2019-09-01 ENCOUNTER — Other Ambulatory Visit: Payer: Self-pay

## 2019-09-01 ENCOUNTER — Encounter (HOSPITAL_COMMUNITY): Payer: Self-pay

## 2019-09-01 ENCOUNTER — Other Ambulatory Visit (HOSPITAL_COMMUNITY): Payer: PPO

## 2019-09-01 DIAGNOSIS — K219 Gastro-esophageal reflux disease without esophagitis: Secondary | ICD-10-CM | POA: Diagnosis not present

## 2019-09-01 DIAGNOSIS — K22711 Barrett's esophagus with high grade dysplasia: Secondary | ICD-10-CM | POA: Diagnosis not present

## 2019-09-01 NOTE — Progress Notes (Signed)
DUE TO COVID-19 ONLY ONE VISITOR IS ALLOWED TO COME WITH YOU AND STAY IN THE WAITING ROOM ONLY DURING PRE OP AND PROCEDURE DAY OF SURGERY. THE 1 VISITOR  MAY VISIT WITH YOU AFTER SURGERY IN YOUR PRIVATE ROOM DURING VISITING HOURS ONLY!  YOU NEED TO HAVE A COVID 19 TEST 09/02/19@_______ , THIS TEST MUST BE DONE BEFORE SURGERY,  COVID TESTING SITE [place]  [id number], IT IS ON THE RIGHT GOING OUT [address] APPROXIMATELY  2 MINUTES PAST [employer] ON THE RIGHT. ONCE YOUR COVID TEST IS COMPLETED,  PLEASE BEGIN THE QUARANTINE INSTRUCTIONS AS OUTLINED IN YOUR HANDOUT.                Francisco [name]  09/01/2019   Your procedure is scheduled on: 09/04/19  Report to [facility] Main  Entrance   Report to admitting     [phone]     Call this number if you have problems the morning of surgery [phone]    Remember: Do not eat food , candy gum or mints :After Midnight. You may have clear liquids from midnight until   0430am    CLEAR LIQUID DIET   Foods Allowed                                                                       Coffee and tea, regular and decaf                              Plain Jell-O any favor except red or purple                                            Fruit ices (not with fruit pulp)                                      Iced Popsicles                                     Carbonated beverages, regular and diet                                    Cranberry, grape and apple juices Sports drinks like Gatorade Lightly seasoned clear broth or consume(fat free) Sugar, honey syrup   _____________________________________________________________________    BRUSH YOUR TEETH MORNING OF SURGERY AND RINSE YOUR MOUTH OUT, NO CHEWING GUM CANDY OR MINTS.     Take these medicines the morning of surgery with A SIP OF WATER:  No jardiance day before surgery and none day of surgery , Nexium, Proscar, Flomax  DO NOT TAKE ANY DIABETIC  MEDICATIONS DAY OF YOUR SURGERY  You may not have any metal on your body including hair pins and              piercings  Do not wear jewelry, make-up, lotions, powders or perfumes, deodorant             Do not wear nail polish on your fingernails.  Do not shave  48 hours prior to surgery.              Men may shave face and neck.   Do not bring valuables to the hospital. [place].  Contacts, dentures or bridgework may not be worn into surgery.  Leave suitcase in the car. After surgery it may be brought to your room.     Patients discharged the day of surgery will not be allowed to drive home. IF YOU ARE HAVING SURGERY AND GOING HOME THE SAME DAY, YOU MUST HAVE AN ADULT TO DRIVE YOU HOME AND BE WITH YOU FOR 24 HOURS. YOU MAY GO HOME BY TAXI OR UBER OR ORTHERWISE, BUT AN ADULT MUST ACCOMPANY YOU HOME AND STAY WITH YOU FOR 24 HOURS.  Name and phone number of your driver:  Special Instructions: N/A              Please read over the following fact sheets you were given: _____________________________________________________________________  [facility] - Preparing for Surgery Before surgery, you can play an important role.  Because skin is not sterile, your skin needs to be as free of germs as possible.  You can reduce the number of germs on your skin by washing with CHG (chlorahexidine gluconate) soap before surgery.  CHG is an antiseptic cleaner which kills germs and bonds with the skin to continue killing germs even after washing. Please DO NOT use if you have an allergy to CHG or antibacterial soaps.  If your skin becomes reddened/irritated stop using the CHG and inform your nurse when you arrive at Short Stay. Do not shave (including legs and underarms) for at least 48 hours prior to the first CHG shower.  You may shave your face/neck. Please follow these instructions carefully:  1.  Shower with CHG Soap the night  before surgery and the  morning of Surgery.  2.  If you choose to wash your hair, wash your hair first as usual with your  normal  shampoo.  3.  After you shampoo, rinse your hair and body thoroughly to remove the  shampoo.                           4.  Use CHG as you would any other liquid soap.  You can apply chg directly  to the skin and wash                       Gently with a scrungie or clean washcloth.  5.  Apply the CHG Soap to your body ONLY FROM THE NECK DOWN.   Do not use on face/ open                           Wound or open sores. Avoid contact with eyes, ears mouth and genitals (private parts).  Wash face,  Genitals (private parts) with your normal soap.             6.  Wash thoroughly, paying special attention to the area where your surgery  will be performed.  7.  Thoroughly rinse your body with warm water from the neck down.  8.  DO NOT shower/wash with your normal soap after using and rinsing off  the CHG Soap.                9.  Pat yourself dry with a clean towel.            10.  Wear clean pajamas.            11.  Place clean sheets on your bed the night of your first shower and do not  sleep with pets. Day of Surgery : Do not apply any lotions/deodorants the morning of surgery.  Please wear clean clothes to the hospital/surgery center.  FAILURE TO FOLLOW THESE INSTRUCTIONS MAY RESULT IN THE CANCELLATION OF YOUR SURGERY PATIENT SIGNATURE_________________________________  NURSE SIGNATURE__________________________________  ________________________________________________________________________            DUE TO COVID-19 ONLY ONE VISITOR IS ALLOWED TO COME WITH YOU AND STAY IN THE WAITING ROOM ONLY DURING PRE OP AND PROCEDURE DAY OF SURGERY. THE 1 VISITOR  MAY VISIT WITH YOU AFTER SURGERY IN YOUR PRIVATE ROOM DURING VISITING HOURS ONLY!  YOU NEED TO HAVE A COVID 19 TEST ON_______ @_______ , THIS TEST MUST BE DONE BEFORE SURGERY,  COVID TESTING [address] [place] [address], IT IS ON THE RIGHT GOING OUT [address] APPROXIMATELY  2 MINUTES PAST [employer] ON THE RIGHT. ONCE YOUR COVID TEST IS COMPLETED,  PLEASE BEGIN THE QUARANTINE INSTRUCTIONS AS OUTLINED IN YOUR HANDOUT.                Francisco [name]  09/01/2019   Your procedure is scheduled on:    Report to [facility] Main  Entrance   Report to admitting at AM     Call this number if you have problems the morning of surgery [phone]    Remember: Do not eat food , candy gum or mints :After Midnight. You may have clear liquids from midnight until     CLEAR LIQUID DIET   Foods Allowed                                                                       Coffee and tea, regular and decaf                              Plain Jell-O any favor except red or purple                                            Fruit ices (not with fruit pulp)  Iced Popsicles                                     Carbonated beverages, regular and diet                                    Cranberry, grape and apple juices Sports drinks like Gatorade Lightly seasoned clear broth or consume(fat free) Sugar, honey syrup   _____________________________________________________________________    BRUSH YOUR TEETH MORNING OF SURGERY AND RINSE YOUR MOUTH OUT, NO CHEWING GUM CANDY OR MINTS.     Take these medicines the morning of surgery with A SIP OF WATER:   DO NOT TAKE ANY DIABETIC MEDICATIONS DAY OF YOUR SURGERY                               You may not have any metal on your body including hair pins and              piercings  Do not wear jewelry, make-up, lotions, powders or perfumes, deodorant             Do not wear nail polish on your fingernails.  Do not shave  48 hours prior to surgery.              Men may shave face and neck.   Do not bring valuables to the hospital. [place].  Contacts, dentures or bridgework may not be worn into surgery.  Leave suitcase in the car. After surgery it may be brought to your room.     Patients discharged the day of surgery will not be allowed to drive home. IF YOU ARE HAVING SURGERY AND GOING HOME THE SAME DAY, YOU MUST HAVE AN ADULT TO DRIVE YOU HOME AND BE WITH YOU FOR 24 HOURS. YOU MAY GO HOME BY TAXI OR UBER OR ORTHERWISE, BUT AN ADULT MUST ACCOMPANY YOU HOME AND STAY WITH YOU FOR 24 HOURS.  Name and phone number of your driver:  Special Instructions: N/A              Please read over the following fact sheets you were given: _____________________________________________________________________  [facility] - Preparing for Surgery Before surgery, you can play an important role.  Because skin is not sterile, your skin needs to be as free of germs as possible.  You can reduce the number of germs on your skin by washing with CHG (chlorahexidine gluconate) soap before surgery.  CHG is an antiseptic cleaner which kills germs and bonds with the skin to continue killing germs even after washing. Please DO NOT use if you have an allergy to CHG or antibacterial soaps.  If your skin becomes reddened/irritated stop using the CHG and inform your nurse when you arrive at Short Stay. Do not shave (including legs and underarms) for at least 48 hours prior to the first CHG shower.  You may shave your face/neck. Please follow these instructions carefully:  1.  Shower with CHG Soap the night before surgery and the  morning of Surgery.  2.  If you choose to wash your hair, wash your hair first as usual with your  normal  shampoo.  3.  After you shampoo, rinse your hair and body thoroughly to remove the  shampoo.                           4.  Use CHG as you would any other liquid soap.  You can apply chg directly  to the skin and wash                       Gently with a scrungie or clean washcloth.  5.  Apply the CHG Soap to your body  ONLY FROM THE NECK DOWN.   Do not use on face/ open                           Wound or open sores. Avoid contact with eyes, ears mouth and genitals (private parts).                       Wash face,  Genitals (private parts) with your normal soap.             6.  Wash thoroughly, paying special attention to the area where your surgery  will be performed.  7.  Thoroughly rinse your body with warm water from the neck down.  8.  DO NOT shower/wash with your normal soap after using and rinsing off  the CHG Soap.                9.  Pat yourself dry with a clean towel.            10.  Wear clean pajamas.            11.  Place clean sheets on your bed the night of your first shower and do not  sleep with pets. Day of Surgery : Do not apply any lotions/deodorants the morning of surgery.  Please wear clean clothes to the hospital/surgery center.  FAILURE TO FOLLOW THESE INSTRUCTIONS MAY RESULT IN THE CANCELLATION OF YOUR SURGERY PATIENT SIGNATURE_________________________________  NURSE SIGNATURE__________________________________  ________________________________________________________________________

## 2019-09-02 ENCOUNTER — Encounter (HOSPITAL_COMMUNITY)
Admission: RE | Admit: 2019-09-02 | Discharge: 2019-09-02 | Disposition: A | Discharge status: Home / Self Care | Payer: PPO | Source: Ambulatory Visit | Attending: Urology | Admitting: Urology

## 2019-09-02 ENCOUNTER — Other Ambulatory Visit (HOSPITAL_COMMUNITY)
Admission: RE | Admit: 2019-09-02 | Discharge: 2019-09-02 | Disposition: A | Discharge status: Home / Self Care | Payer: PPO | Source: Ambulatory Visit | Attending: Urology | Admitting: Urology

## 2019-09-02 DIAGNOSIS — Z20822 Contact with and (suspected) exposure to covid-19: Secondary | ICD-10-CM | POA: Insufficient documentation

## 2019-09-02 DIAGNOSIS — Z01812 Encounter for preprocedural laboratory examination: Secondary | ICD-10-CM | POA: Diagnosis not present

## 2019-09-02 LAB — SARS CORONAVIRUS 2 (TAT 6-24 HRS): SARS Coronavirus 2: NEGATIVE

## 2019-09-02 LAB — CBC
HCT: 41.4 % (ref 39.0–52.0)
Hemoglobin: 13.4 g/dL (ref 13.0–17.0)
MCH: 28.8 pg (ref 26.0–34.0)
MCHC: 32.4 g/dL (ref 30.0–36.0)
MCV: 89 fL (ref 80.0–100.0)
Platelets: 242 10*3/uL (ref 150–400)
RBC: 4.65 MIL/uL (ref 4.22–5.81)
RDW: 15.4 % (ref 11.5–15.5)
WBC: 7 10*3/uL (ref 4.0–10.5)
nRBC: 0 % (ref 0.0–0.2)

## 2019-09-02 LAB — BASIC METABOLIC PANEL
Anion gap: 7 (ref 5–15)
BUN: 10 mg/dL (ref 8–23)
CO2: 23 mmol/L (ref 22–32)
Calcium: 8.8 mg/dL — ABNORMAL LOW (ref 8.9–10.3)
Chloride: 108 mmol/L (ref 98–111)
Creatinine, Ser: 0.89 mg/dL (ref 0.61–1.24)
GFR calc Af Amer: >60 mL/min (ref >60)
GFR calc non Af Amer: >60 mL/min (ref >60)
Glucose, Bld: 267 mg/dL — ABNORMAL HIGH (ref 70–99)
Potassium: 3.2 mmol/L — ABNORMAL LOW (ref 3.5–5.1)
Sodium: 138 mmol/L (ref 135–145)

## 2019-09-02 NOTE — Progress Notes (Signed)
BMP done 09/02/19 faxed via epic to Dr Tresa Moore

## 2019-09-02 NOTE — Progress Notes (Addendum)
Anesthesia Review: Last cysto 08/19/19  PCP:  Cardiologist : Chest x-ray : EKG : 08/18/19  Echo : Stress test: Cardiac Cath :  Activity level:  Sleep Study/ CPAP : Fasting Blood Sugar :      / Checks Blood Sugar -- times a day:   Blood Thinner/ Instructions /Last Dose: ASA / Instructions/ Last Dose :  A1c-08/18/19- 8.3  FYI- on 09/01/19 LOV- DR Egbert Garibaldi at [facility] for spot on esophagus note in epic.  Pt states treatment planned for 12/21.   BMP done 09/02/19 routed to DR [facility]

## 2019-09-03 MED ORDER — GENTAMICIN SULFATE 40 MG/ML IJ SOLN
5.0000 mg/kg | INTRAVENOUS | Status: AC
  Administered 2019-09-04: 300 mg via INTRAVENOUS
  Filled 2019-09-03: qty 7.5

## 2019-09-03 MED ORDER — GENTAMICIN SULFATE 40 MG/ML IJ SOLN
5.0000 mg/kg | INTRAVENOUS | Status: DC
  Filled 2019-09-03: qty 7.5

## 2019-09-04 ENCOUNTER — Ambulatory Visit (HOSPITAL_COMMUNITY): Payer: PPO | Admitting: Physician Assistant

## 2019-09-04 ENCOUNTER — Ambulatory Visit (HOSPITAL_COMMUNITY): Payer: PPO

## 2019-09-04 ENCOUNTER — Ambulatory Visit (HOSPITAL_COMMUNITY): Payer: PPO | Admitting: Certified Registered Nurse Anesthetist

## 2019-09-04 ENCOUNTER — Encounter (HOSPITAL_COMMUNITY): Payer: Self-pay | Admitting: Urology

## 2019-09-04 ENCOUNTER — Ambulatory Visit (HOSPITAL_COMMUNITY)
Admission: RE | Admit: 2019-09-04 | Discharge: 2019-09-04 | Disposition: A | Discharge status: Home / Self Care | Payer: PPO | Attending: Urology | Admitting: Urology

## 2019-09-04 ENCOUNTER — Encounter (HOSPITAL_COMMUNITY)
Admission: RE | Disposition: A | Discharge status: Home / Self Care | Payer: Self-pay | Source: Home / Self Care | Attending: Urology

## 2019-09-04 DIAGNOSIS — Z87891 Personal history of nicotine dependence: Secondary | ICD-10-CM | POA: Diagnosis not present

## 2019-09-04 DIAGNOSIS — K219 Gastro-esophageal reflux disease without esophagitis: Secondary | ICD-10-CM | POA: Insufficient documentation

## 2019-09-04 DIAGNOSIS — Z79899 Other long term (current) drug therapy: Secondary | ICD-10-CM | POA: Diagnosis not present

## 2019-09-04 DIAGNOSIS — N200 Calculus of kidney: Secondary | ICD-10-CM | POA: Diagnosis not present

## 2019-09-04 DIAGNOSIS — N401 Enlarged prostate with lower urinary tract symptoms: Secondary | ICD-10-CM | POA: Insufficient documentation

## 2019-09-04 DIAGNOSIS — Z7984 Long term (current) use of oral hypoglycemic drugs: Secondary | ICD-10-CM | POA: Insufficient documentation

## 2019-09-04 DIAGNOSIS — K760 Fatty (change of) liver, not elsewhere classified: Secondary | ICD-10-CM | POA: Diagnosis not present

## 2019-09-04 DIAGNOSIS — E119 Type 2 diabetes mellitus without complications: Secondary | ICD-10-CM | POA: Diagnosis not present

## 2019-09-04 DIAGNOSIS — N202 Calculus of kidney with calculus of ureter: Secondary | ICD-10-CM | POA: Insufficient documentation

## 2019-09-04 DIAGNOSIS — N400 Enlarged prostate without lower urinary tract symptoms: Secondary | ICD-10-CM | POA: Diagnosis not present

## 2019-09-04 DIAGNOSIS — R972 Elevated prostate specific antigen [PSA]: Secondary | ICD-10-CM | POA: Insufficient documentation

## 2019-09-04 DIAGNOSIS — H903 Sensorineural hearing loss, bilateral: Secondary | ICD-10-CM | POA: Diagnosis not present

## 2019-09-04 DIAGNOSIS — J45909 Unspecified asthma, uncomplicated: Secondary | ICD-10-CM | POA: Insufficient documentation

## 2019-09-04 HISTORY — PX: CYSTOSCOPY WITH RETROGRADE PYELOGRAM, URETEROSCOPY AND STENT PLACEMENT: SHX5789

## 2019-09-04 LAB — GLUCOSE, CAPILLARY
Glucose-Capillary: 126 mg/dL — ABNORMAL HIGH (ref 70–99)
Glucose-Capillary: 127 mg/dL — ABNORMAL HIGH (ref 70–99)

## 2019-09-04 SURGERY — CYSTOURETEROSCOPY, WITH RETROGRADE PYELOGRAM AND STENT INSERTION
Anesthesia: General | Laterality: Right

## 2019-09-04 MED ORDER — PHENYLEPHRINE HCL-NACL 10-0.9 MG/250ML-% IV SOLN
INTRAVENOUS | Status: DC | PRN
  Administered 2019-09-04: 35 ug/min via INTRAVENOUS

## 2019-09-04 MED ORDER — FENTANYL CITRATE (PF) 100 MCG/2ML IJ SOLN
INTRAMUSCULAR | Status: DC | PRN
  Administered 2019-09-04 (×2): 25 ug via INTRAVENOUS

## 2019-09-04 MED ORDER — CHLORHEXIDINE GLUCONATE 0.12 % MT SOLN
15.0000 mL | Freq: Once | OROMUCOSAL | Status: AC
  Administered 2019-09-04: 15 mL via OROMUCOSAL

## 2019-09-04 MED ORDER — ONDANSETRON HCL 4 MG/2ML IJ SOLN
INTRAMUSCULAR | Status: DC | PRN
  Administered 2019-09-04: 4 mg via INTRAVENOUS

## 2019-09-04 MED ORDER — LIDOCAINE 2% (20 MG/ML) 5 ML SYRINGE
INTRAMUSCULAR | Status: DC | PRN
  Administered 2019-09-04: 40 mg via INTRAVENOUS

## 2019-09-04 MED ORDER — PROPOFOL 10 MG/ML IV BOLUS
INTRAVENOUS | Status: AC
  Filled 2019-09-04: qty 20

## 2019-09-04 MED ORDER — PHENYLEPHRINE 40 MCG/ML (10ML) SYRINGE FOR IV PUSH (FOR BLOOD PRESSURE SUPPORT)
PREFILLED_SYRINGE | INTRAVENOUS | Status: DC | PRN
  Administered 2019-09-04: 80 ug via INTRAVENOUS

## 2019-09-04 MED ORDER — ONDANSETRON HCL 4 MG/2ML IJ SOLN
INTRAMUSCULAR | Status: AC
  Filled 2019-09-04: qty 2

## 2019-09-04 MED ORDER — PROPOFOL 10 MG/ML IV BOLUS
INTRAVENOUS | Status: DC | PRN
  Administered 2019-09-04: 150 mg via INTRAVENOUS

## 2019-09-04 MED ORDER — KETOROLAC TROMETHAMINE 10 MG PO TABS: 10.0000 mg | ORAL_TABLET | Freq: Three times a day (TID) | ORAL | 0 refills | Status: DC | PRN

## 2019-09-04 MED ORDER — ORAL CARE MOUTH RINSE: 15.0000 mL | Freq: Once | OROMUCOSAL | Status: AC

## 2019-09-04 MED ORDER — FENTANYL CITRATE (PF) 100 MCG/2ML IJ SOLN
INTRAMUSCULAR | Status: AC
  Filled 2019-09-04: qty 2

## 2019-09-04 MED ORDER — PHENYLEPHRINE HCL (PRESSORS) 10 MG/ML IV SOLN
INTRAVENOUS | Status: AC
  Filled 2019-09-04: qty 1

## 2019-09-04 MED ORDER — SODIUM CHLORIDE 0.9 % IR SOLN
Status: DC | PRN
  Administered 2019-09-04: 3000 mL

## 2019-09-04 MED ORDER — PHENYLEPHRINE 40 MCG/ML (10ML) SYRINGE FOR IV PUSH (FOR BLOOD PRESSURE SUPPORT)
PREFILLED_SYRINGE | INTRAVENOUS | Status: AC
  Filled 2019-09-04: qty 10

## 2019-09-04 MED ORDER — TRAMADOL HCL 50 MG PO TABS
50.0000 mg | ORAL_TABLET | Freq: Four times a day (QID) | ORAL | 0 refills | Status: DC | PRN
Start: 2019-09-04 — End: 2020-08-12

## 2019-09-04 MED ORDER — CEPHALEXIN 500 MG PO CAPS: 500.0000 mg | ORAL_CAPSULE | Freq: Two times a day (BID) | ORAL | 0 refills | Status: DC

## 2019-09-04 MED ORDER — EPHEDRINE SULFATE-NACL 50-0.9 MG/10ML-% IV SOSY
PREFILLED_SYRINGE | INTRAVENOUS | Status: DC | PRN
  Administered 2019-09-04 (×2): 5 mg via INTRAVENOUS

## 2019-09-04 MED ORDER — SODIUM CHLORIDE 0.9 % IR SOLN
Status: DC | PRN
  Administered 2019-09-04: 1000 mL

## 2019-09-04 MED ORDER — LACTATED RINGERS IV SOLN: INTRAVENOUS | Status: DC

## 2019-09-04 MED ORDER — LIDOCAINE 2% (20 MG/ML) 5 ML SYRINGE
INTRAMUSCULAR | Status: AC
  Filled 2019-09-04: qty 5

## 2019-09-04 MED ORDER — IOHEXOL 300 MG/ML  SOLN
INTRAMUSCULAR | Status: DC | PRN
  Administered 2019-09-04: 18 mL

## 2019-09-04 MED ORDER — FENTANYL CITRATE (PF) 100 MCG/2ML IJ SOLN: 25.0000 ug | INTRAMUSCULAR | Status: DC | PRN

## 2019-09-04 SURGICAL SUPPLY — 25 items
BAG URO CATCHER STRL LF (MISCELLANEOUS) ×3 IMPLANT
BASKET LASER NITINOL 1.9FR (BASKET) ×3 IMPLANT
BSKT STON RTRVL 120 1.9FR (BASKET) ×1
CATH INTERMIT  6FR 70CM (CATHETERS) ×3 IMPLANT
CLOTH BEACON ORANGE TIMEOUT ST (SAFETY) ×3 IMPLANT
EXTRACTOR STONE 1.7FRX115CM (UROLOGICAL SUPPLIES) IMPLANT
GLOVE BIOGEL M STRL SZ7.5 (GLOVE) ×3 IMPLANT
GOWN STRL REUS W/TWL LRG LVL3 (GOWN DISPOSABLE) ×3 IMPLANT
GUIDEWIRE ANG ZIPWIRE 038X150 (WIRE) ×3 IMPLANT
GUIDEWIRE STR DUAL SENSOR (WIRE) ×3 IMPLANT
KIT TURNOVER KIT A (KITS) IMPLANT
LASER FIB FLEXIVA PULSE ID 365 (Laser) IMPLANT
LASER FIB FLEXIVA PULSE ID 550 (Laser) IMPLANT
LASER FIB FLEXIVA PULSE ID 910 (Laser) IMPLANT
MANIFOLD NEPTUNE II (INSTRUMENTS) ×3 IMPLANT
PACK CYSTO (CUSTOM PROCEDURE TRAY) ×3 IMPLANT
SHEATH URETERAL 12FRX28CM (UROLOGICAL SUPPLIES) IMPLANT
SHEATH URETERAL 12FRX35CM (MISCELLANEOUS) ×3 IMPLANT
STENT POLARIS 5FRX26 (STENTS) ×3 IMPLANT
TRACTIP FLEXIVA PULS ID 200XHI (Laser) IMPLANT
TRACTIP FLEXIVA PULSE ID 200 (Laser)
TUBE FEEDING 8FR 16IN STR KANG (MISCELLANEOUS) ×3 IMPLANT
TUBING CONNECTING 10 (TUBING) ×2 IMPLANT
TUBING CONNECTING 10' (TUBING) ×1
TUBING UROLOGY SET (TUBING) ×3 IMPLANT

## 2019-09-04 NOTE — Anesthesia Preprocedure Evaluation (Addendum)
Anesthesia Evaluation  Patient identified by MRN, date of birth, ID band Patient awake    Reviewed: Allergy & Precautions, NPO status , Patient's Chart, lab work & pertinent test results  Airway Mallampati: II  TM Distance: >3 FB Neck ROM: Full    Dental  (+) Missing, Chipped, Poor Dentition, Dental Advisory Given,    Pulmonary neg pulmonary ROS, former smoker,    Pulmonary exam normal breath sounds clear to auscultation       Cardiovascular Normal cardiovascular exam Rhythm:Regular Rate:Normal  HLD   Neuro/Psych negative neurological ROS  negative psych ROS   GI/Hepatic Neg liver ROS, GERD  ,  Endo/Other  negative endocrine ROSdiabetes, Oral Hypoglycemic Agents  Renal/GU negative Renal ROS  negative genitourinary   Musculoskeletal negative musculoskeletal ROS (+)   Abdominal   Peds  Hematology negative hematology ROS (+)   Anesthesia Other Findings   Reproductive/Obstetrics                            Anesthesia Physical Anesthesia Plan  ASA: III  Anesthesia Plan: General   Post-op Pain Management:    Induction: Intravenous  PONV Risk Score and Plan: 2 and Ondansetron and Dexamethasone  Airway Management Planned: LMA  Additional Equipment:   Intra-op Plan:   Post-operative Plan: Extubation in OR  Informed Consent: I have reviewed the patients History and Physical, chart, labs and discussed the procedure including the risks, benefits and alternatives for the proposed anesthesia with the patient or authorized representative who has indicated his/her understanding and acceptance.     Dental advisory given  Plan Discussed with: CRNA  Anesthesia Plan Comments:         Anesthesia Quick Evaluation

## 2019-09-04 NOTE — H&P (Signed)
Francisco [name] is an 86 y.o. male.    Chief Complaint: PRe-Op 2nd Stage RIGHT Ureteroscopic Stone Manipulation  HPI:     1 - Enlarged Prostate with Weak Urinary Stream, Urgency - manages with finasteride + BID tamsulosin. Prior TRUS 130gm TRUS 2013. NO retention. Stopped PSA screening 2013 at age 38 at which point PSA 1.68. Most bothered by nocturia x 4 now down to 1-2 with max meds.   2 - Nephrolithiasis - Rt ureteroscopy 2013 for small ureteral stone, mostly CaOx on eval gross hematuria.   3 -Simple Renal Cysts - Bosniak I bilateral renal cysts by CT and MRI 2013.   4 - Gross Hematuria - Eval 2013 with ureteral stone and BPH only.    PMH sig for benign pancreatic mass. His PCP is Burnard Bunting MD. 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 He had 1st stage procedure on 8/18 and estimate 60-70% of stone volume removed. NO interavl fevers. C19 screen negative. Cr <1.  Past Medical History:  Diagnosis Date  . Asthma with allergic rhinitis   . BPH (benign prostatic hyperplasia)   . DM (diabetes mellitus) ([place])   . Elevated CEA   . Elevated prostate specific antigen (PSA)   . Fatty liver   . GERD (gastroesophageal reflux disease)   . Hx of adenomatous colonic polyps 02/14/2016   10 adenomas max 15 mm 02/2016 - no recall at his age  . Hyperlipidemia   . Impotence of organic origin   . Neoplasm of uncertain behavior of prostate   . Pancreatic cyst FOLLOWED BY DR JACOBS  . Personal history of urinary calculi   . Renal calculi 09/19/2011  . Right ureteral stone     Past Surgical History:  Procedure Laterality Date  . CHOLECYSTECTOMY    . COLONOSCOPY W/ POLYPECTOMY  2018  . CYSTOSCOPY WITH RETROGRADE PYELOGRAM, URETEROSCOPY AND STENT PLACEMENT Right 08/19/2019   Procedure: CYSTOSCOPY WITH RETROGRADE PYELOGRAM, URETEROSCOPY, LASER LITHOTRIPSY STENT PLACEMENT FIRST STAGE;   Surgeon: Alexis Frock, MD;  Location: [facility];  Service: Urology;  Laterality: Right;  90 MINS  . CYSTOSCOPY WITH URETEROSCOPY  03/14/2011   Procedure: CYSTOSCOPY WITH URETEROSCOPY;  Surgeon: Molli Hazard, MD;  [facility];  Service: Urology;  Laterality: Right;  2 hours requested for this case  Right Ureter Stent Placement  C-ARM CAMERA DIGITAL URETEROSCOPE    . EUS  04/12/2011   Procedure: UPPER ENDOSCOPIC ULTRASOUND (EUS) LINEAR;  Surgeon: Milus Banister, MD;  Location: [facility];  Service: Endoscopy;  Laterality: N/A;  radial linear/pt moved up a hour early by AW , Patty @ [place] office ok'd & pt was called by AW  . HOLMIUM LASER APPLICATION Right 1/69/6789   Procedure: HOLMIUM LASER APPLICATION;  Surgeon: Alexis Frock, MD;  Location: [facility];  Service: Urology;  Laterality: Right;  . KIDNEY STONE SURGERY  March 2013  . PERCUTANEOUS NEPHROSTOLITHOTOMY  1985  . WHIPPLE PROCEDURE  12/2011   [facility]    Family History  Problem Relation Age of Onset  . Diabetes Mother   . Diabetes Sister   . Cancer Brother        unknown type  . Stroke Father    Social History:  reports that he quit smoking about 28 years ago. His smoking use included cigarettes. He quit after 20.00 years of use. He has never used smokeless  tobacco. He reports current alcohol use. He reports that he does not use drugs.  Allergies: No Known Allergies  Medications Prior to Admission  Medication Sig Dispense Refill  . ACCU-CHEK FASTCLIX LANCETS MISC by Does not apply route daily.    . APPLE CIDER VINEGAR PO Take 1 capsule by mouth daily.     Marland Kitchen CINNAMON PO Take 2,000 mg by mouth daily.    . empagliflozin (JARDIANCE) 10 MG TABS tablet Take 5 mg by mouth daily.    Marland Kitchen esomeprazole (NEXIUM) 40 MG capsule Take 40 mg by mouth 2 (two) times daily.    . finasteride (PROSCAR) 5 MG tablet Take 5 mg by mouth daily.    Marland Kitchen ketorolac (TORADOL) 10 MG tablet Take 1 tablet (10 mg total) by mouth  every 8 (eight) hours as needed for moderate pain. Or stent discomfort post-operatively 20 tablet 0  . Multiple Vitamin (MULTIVITAMIN) tablet Take 1 tablet by mouth daily.    . Probiotic Product (ALIGN) 4 MG CAPS Take 4 mg by mouth daily.     . tamsulosin (FLOMAX) 0.4 MG CAPS capsule Take 0.4 mg by mouth 2 (two) times daily.    . traMADol (ULTRAM) 50 MG tablet Take 1-2 tablets (50-100 mg total) by mouth every 6 (six) hours as needed for severe pain. Post-operatively 30 tablet 0  . triamcinolone cream (KENALOG) 0.1 % Apply 1 application topically 3 (three) times daily as needed (itching).      Results for orders placed or performed during the hospital encounter of 09/02/19 (from the past 48 hour(s))  SARS CORONAVIRUS 2 (TAT 6-24 HRS) Nasopharyngeal Nasopharyngeal Swab     Status: None   Collection Time: 09/02/19 11:14 AM   Specimen: Nasopharyngeal Swab  Result Value Ref Range   SARS Coronavirus 2 NEGATIVE NEGATIVE    Comment: (NOTE) SARS-CoV-2 target nucleic acids are NOT DETECTED.  The SARS-CoV-2 RNA is generally detectable in upper and lower respiratory specimens during the acute phase of infection. Negative results do not preclude SARS-CoV-2 infection, do not rule out co-infections with other pathogens, and should not be used as the sole basis for treatment or other patient management decisions. Negative results must be combined with clinical observations, patient history, and epidemiological information. The expected result is Negative.  Fact Sheet for Patients: SugarRoll.be  Fact Sheet for Healthcare Providers: https://www.woods-mathews.com/  This test is not yet approved or cleared by the [place] FDA and  has been authorized for detection and/or diagnosis of SARS-CoV-2 by FDA under an Emergency Use Authorization (EUA). This EUA will remain  in effect (meaning this test can be used) for the duration of the COVID-19 declaration  under Se ction 564(b)(1) of the Act, 21 U.S.C. section 360bbb-3(b)(1), unless the authorization is terminated or revoked sooner.  Performed at [facility], [place] [address], [place], [place] [address]    No results found.  Review of Systems  Constitutional: Negative.  Negative for chills and fever.  Genitourinary: Positive for frequency.  All other systems reviewed and are negative.   There were no vitals taken for this visit. Physical Exam Vitals reviewed.  Constitutional:      Comments: Very metnally spry, at baseline.   HENT:     Nose: Nose normal.  Cardiovascular:     Rate and Rhythm: Normal rate.  Pulmonary:     Effort: Pulmonary effort is normal.  Abdominal:     General: Abdomen is flat.  Genitourinary:    Comments: No CVAT at present.  Musculoskeletal:        General: Normal range of motion.     Cervical back: Normal range of motion.  Skin:    General: Skin is warm.  Neurological:     General: No focal deficit present.     Mental Status: He is alert.  Psychiatric:        Mood and Affect: Mood normal.      Assessment/Plan  Proceed as planned with RIGHT 2nd stage ureteroscopic stone manipulation with goal of stone free. Risks, benefits, alternatives, expected peri-op course discussed previously and reiterated today.   Alexis Frock, MD 09/04/2019, 5:28 AM

## 2019-09-04 NOTE — Anesthesia Postprocedure Evaluation (Signed)
Anesthesia Post Note  Patient: Francisco [name]  Procedure(s) Performed: CYSTOSCOPY WITH RETROGRADE PYELOGRAM, BASKETING OF STONES URETEROSCOPY AND STENT EXCHANGE (Right )     Patient location during evaluation: PACU Anesthesia Type: General Level of consciousness: awake and alert Pain management: pain level controlled Vital Signs Assessment: post-procedure vital signs reviewed and stable Respiratory status: spontaneous breathing, nonlabored ventilation, respiratory function stable and patient connected to nasal cannula oxygen Cardiovascular status: blood pressure returned to baseline and stable Postop Assessment: no apparent nausea or vomiting Anesthetic complications: no   No complications documented.  Last Vitals:  Vitals:   09/04/19 0845 09/04/19 0850  BP: 120/74 132/71  Pulse: 63 60  Resp: 13 15  Temp: 36.7 C 36.7 C  SpO2: 100% 100%    Last Pain:  Vitals:   09/04/19 0850  TempSrc:   PainSc: 0-No pain                 Francisco [name]

## 2019-09-04 NOTE — Op Note (Signed)
NAME: Francisco [name], Francisco [name] MEDICAL RECORD [id number] ACCOUNT [id number] DATE OF BIRTH:[birth year] FACILITY: [facility] LOCATION: [facility] PHYSICIAN:Kimberleigh Mehan Tresa Moore, MD  OPERATIVE REPORT  DATE OF PROCEDURE:  09/04/2019  SURGEON:  Alexis Frock, MD  PREOPERATIVE DIAGNOSIS:  Right residual renal and ureteral stone.  POSTOPERATIVE DIAGNOSIS:  Right residual renal and ureteral stone.  PROCEDURE: 1.  Cystoscopy, right retrograde pyelogram, interpretation. 2.  Right ureteroscopy second-stage with basketing of stones. 3.  Replacement of right ureteral stent, 5 x 26 Polaris with tether.  ESTIMATED BLOOD LOSS:  Nil.  COMPLICATIONS:  None.  SPECIMEN:  Residual right renal and ureteral stone fragments for analysis.  FINDINGS: 1.  Approximately 8 mm total volume right distal ureteral stone fragments. 2.  Approximately 8 mm volume right intrarenal stone fragments. 3.  Complete resolution of all accessible stone fragments larger than one-third mm following basket extraction. 4.  Successful placement of right ureteral stent, proximal end in the renal pelvis, distal end in the urinary bladder.  INDICATIONS:  The patient is a very pleasant 86 year old man with history of recurrent urolithiasis, found on workup for colicky flank pain and hematuria to have a fairly significant volume right ureteral as well as right renal stones.  I had a  discussion for management and he wished to proceed with ureteroscopy with goal of stone free.  He underwent a first stage procedure several weeks ago, at which the majority of this stone volume was addressed.  Given the volume of stone with poor  angulation due to significant J hooking of his ureter, we were able to completely remove all fragments  at the time.  Options were discussed, including proceeding with second-stage procedure with the goal of stone free.  He wished to proceed.  He  presents for this today.  Informed consent was obtained and placed in the medical  record.  DESCRIPTION OF PROCEDURE:  The patient is being identified, the procedure being second-stage right ureteroscopic stone manipulation was confirmed.  Procedure timeout was performed.  Intravenous antibiotics administered.  General anesthesia introduced.   The patient was placed into a low lithotomy position, sterile field was created, prepped and draped base of the penis, perineum and proximal thighs using iodine.  Cystourethroscopy was performed using 21-French rigid cystoscope with offset lens.   Inspection of anterior and posterior urethra revealed a large trilobar prostatic hypertrophy as per prior.  Inspection of the urinary bladder revealed mild to moderate trabeculation.  The distal end of the right stent was seen.  It was grasped with cold  graspers, brought to the level of the urethral meatus.  A 0.03 ZIPwire was advanced to the lower pole and the stent was exchanged for open-ended catheter and right retrograde pyelogram was obtained.  Right retrograde pyelogram demonstrated a single right ureter with somewhat bifid right kidney.  No evidence of extravasation, no large filling defects.  A ZIPwire was once again advanced and set aside as a safety wire.  An 8-French feeding tube was  placed in the urinary bladder for pressure release and semirigid ureteroscopy and the distal 4/5ths right ureter alongside a separate sensor working wire.  In the distal ureter, there was a small steinstrasse of stones, approximately 8 mm total volume.  These fragments  were sequentially grasped, brought to the level of the urinary bladder and set aside.  A semirigid ureteroscopy of the remainder of the distal four-fifths right ureter revealed no additional calcifications.  The semirigid scope was exchanged for a 12/14  medium length ureteral access sheath to  the level of the proximal ureter.  Using continuous fluoroscopic guidance, a flexible digital ureteroscopy was performed the proximal ureter and systematic  inspection of the right kidney, including all calices x3.   There was multifocal intrarenal stones, mostly in the mid and lower pole calices as expected.  Fortunately, all of these were amenable to simple basketing.  As such, the Escape basket was once again used and with sequential grafting technique, all  fragments larger than one-third mm were easily removed, set aside for discard.  Following this, excellent hemostasis, no evidence of renal perforation and complete resolution of all accessible stone fragments larger than one-third mm.  The access sheath  was removed under continuous vision.  No significant mucosal abnormalities were found.  Given access sheath usage, it was felt that brief interval stenting with tethered stent would be warranted.  As such, a new 5 x 26 Polaris-type stent was placed over  remaining safety wire using fluoroscopic guidance.  Good proximal and distal plane were noted.  Tether was left in place and fashioned to the dorsum of the penis and the procedure was terminated.  The patient tolerated the procedure well.  There were no  immediate periprocedural complications.  The patient was taken to postanesthesia care in stable condition with plan for discharge home.  VN/NUANCE  D:09/04/2019 T:09/04/2019 JOB:[id number]

## 2019-09-04 NOTE — Anesthesia Procedure Notes (Signed)
Procedure Name: LMA Insertion Date/Time: 09/04/2019 7:26 AM Performed by: Maxwell Caul, CRNA Pre-anesthesia Checklist: Patient identified, Emergency Drugs available, Suction available and Patient being monitored Patient Re-evaluated:Patient Re-evaluated prior to induction Oxygen Delivery Method: Circle system utilized Preoxygenation: Pre-oxygenation with 100% oxygen Induction Type: IV induction LMA: LMA inserted LMA Size: 4.0 Number of attempts: 1 Placement Confirmation: positive ETCO2 and breath sounds checked- equal and bilateral Tube secured with: Tape Dental Injury: Teeth and Oropharynx as per pre-operative assessment

## 2019-09-04 NOTE — Discharge Instructions (Signed)
1 - You may have urinary urgency (bladder spasms) and bloody urine on / off with stent in place. This is normal. ? ?2 - Remove tethered stent on Monday morning at home by pulling on string, then blue-white plastic tubing and discarding. Office is open Monday if any problems arise.  ? ?3 - Call MD or go to ER for fever >102, severe pain / nausea / vomiting not relieved by medications, or acute change in medical status ? ?

## 2019-09-04 NOTE — Transfer of Care (Signed)
Immediate Anesthesia Transfer of Care Note  Patient: Francisco [name]  Procedure(s) Performed: CYSTOSCOPY WITH RETROGRADE PYELOGRAM, BASKETING OF STONES URETEROSCOPY AND STENT EXCHANGE (Right )  Patient Location: PACU  Anesthesia Type:General  Level of Consciousness: awake, alert  and oriented  Airway & Oxygen Therapy: Patient Spontanous Breathing and Patient connected to face mask oxygen  Post-op Assessment: Report given to RN and Post -op Vital signs reviewed and stable  Post vital signs: Reviewed and stable  Last Vitals:  Vitals Value Taken Time  BP 144/86 09/04/19 0823  Temp    Pulse 60 09/04/19 0826  Resp 19 09/04/19 0826  SpO2 100 % 09/04/19 0826  Vitals shown include unvalidated device data.  Last Pain:  Vitals:   09/04/19 0606  TempSrc:   PainSc: 0-No pain         Complications: No complications documented.

## 2019-09-04 NOTE — Brief Op Note (Signed)
09/04/2019  8:14 AM  PATIENT:  [name]  86 y.o. male  PRE-OPERATIVE DIAGNOSIS:  RESIDUAL RIGHT RENAL STONE  POST-OPERATIVE DIAGNOSIS:  RESIDUAL RIGHT RENAL STONE  PROCEDURE:  Procedure(s) with comments: CYSTOSCOPY WITH RETROGRADE PYELOGRAM, BASKETING OF STONES URETEROSCOPY AND STENT EXCHANGE (Right) - 60 MINS  SURGEON:  Surgeon(s) and Role:    * Alexis Frock, MD - Primary  PHYSICIAN ASSISTANT:   ASSISTANTS: none   ANESTHESIA:   general  EBL:  minimal   BLOOD ADMINISTERED:none  DRAINS: none   LOCAL MEDICATIONS USED:  NONE  SPECIMEN:  Source of Specimen:  Rt renal / ureteral stone fragments  DISPOSITION OF SPECIMEN:  discard  COUNTS:  YES  TOURNIQUET:  * No tourniquets in log *  DICTATION: .Other Dictation: Dictation Number [id number]  PLAN OF CARE: Discharge to home after PACU  PATIENT DISPOSITION:  PACU - hemodynamically stable.   Delay start of Pharmacological VTE agent (>24hrs) due to surgical blood loss or risk of bleeding: yes

## 2019-09-05 ENCOUNTER — Encounter (HOSPITAL_COMMUNITY): Payer: Self-pay | Admitting: Urology

## 2019-09-14 DIAGNOSIS — K760 Fatty (change of) liver, not elsewhere classified: Secondary | ICD-10-CM | POA: Diagnosis not present

## 2019-09-14 DIAGNOSIS — K219 Gastro-esophageal reflux disease without esophagitis: Secondary | ICD-10-CM | POA: Diagnosis not present

## 2019-09-14 DIAGNOSIS — R195 Other fecal abnormalities: Secondary | ICD-10-CM | POA: Diagnosis not present

## 2019-09-14 DIAGNOSIS — K869 Disease of pancreas, unspecified: Secondary | ICD-10-CM | POA: Diagnosis not present

## 2019-09-14 DIAGNOSIS — Z87442 Personal history of urinary calculi: Secondary | ICD-10-CM | POA: Diagnosis not present

## 2019-09-14 DIAGNOSIS — E1169 Type 2 diabetes mellitus with other specified complication: Secondary | ICD-10-CM | POA: Diagnosis not present

## 2019-09-14 DIAGNOSIS — R970 Elevated carcinoembryonic antigen [CEA]: Secondary | ICD-10-CM | POA: Diagnosis not present

## 2019-09-14 DIAGNOSIS — E785 Hyperlipidemia, unspecified: Secondary | ICD-10-CM | POA: Diagnosis not present

## 2019-09-21 DIAGNOSIS — N5201 Erectile dysfunction due to arterial insufficiency: Secondary | ICD-10-CM | POA: Diagnosis not present

## 2019-09-21 DIAGNOSIS — R338 Other retention of urine: Secondary | ICD-10-CM | POA: Diagnosis not present

## 2019-09-21 DIAGNOSIS — N201 Calculus of ureter: Secondary | ICD-10-CM | POA: Diagnosis not present

## 2019-09-22 ENCOUNTER — Ambulatory Visit (INDEPENDENT_AMBULATORY_CARE_PROVIDER_SITE_OTHER): Payer: PPO | Admitting: Podiatry

## 2019-09-22 ENCOUNTER — Other Ambulatory Visit: Payer: Self-pay

## 2019-09-22 DIAGNOSIS — M79675 Pain in left toe(s): Secondary | ICD-10-CM

## 2019-09-22 DIAGNOSIS — Q828 Other specified congenital malformations of skin: Secondary | ICD-10-CM

## 2019-09-22 DIAGNOSIS — M79674 Pain in right toe(s): Secondary | ICD-10-CM

## 2019-09-22 DIAGNOSIS — E119 Type 2 diabetes mellitus without complications: Secondary | ICD-10-CM

## 2019-09-22 DIAGNOSIS — B351 Tinea unguium: Secondary | ICD-10-CM | POA: Diagnosis not present

## 2019-09-22 NOTE — Progress Notes (Signed)
Subjective: 86 y.o. returns the office today for painful, elongated, thickened toenails which he cannot trim himself as well as for a callus on the left foot.  No new concerns today.  Denies any redness, drainage, swelling that he reports.   Denies any systemic complaints such as fevers, chills, nausea, vomiting.   PCP: Burnard Bunting, MD  Objective: AAO 3, NAD DP/PT pulses palpable, CRT less than 3 seconds Nails hypertrophic, dystrophic, elongated, brittle, discolored 10. There is tenderness overlying the nails 1-5 bilaterally. There is no surrounding erythema or drainage along the nail sites. The nails are thick and ingrown as well. They have not been trimmed in some time.  Minimal hyperkeratotic tissue left foot. No ulcerations, drainage or signs of infection.  No other areas of tenderness bilateral lower extremities. No overlying edema, erythema, increased warmth. No pain with calf compression, swelling, warmth, erythema.  Assessment: Patient presents with symptomatic onychomycosis; hyperkeratotic lesion  Plan: -Treatment options including alternatives, risks, complications were discussed -Nails sharply debrided 10 without complication/bleeding. -Sharply debrided hyperkeratotic lesion x1 without any complications or bleeding.  -Discussed daily foot inspection. If there are any changes, to call the office immediately.  -Follow-up in 3 months or as needed if any problems are to arise.   Celesta Gentile, DPM

## 2019-10-07 DIAGNOSIS — R609 Edema, unspecified: Secondary | ICD-10-CM | POA: Diagnosis not present

## 2019-10-07 DIAGNOSIS — R197 Diarrhea, unspecified: Secondary | ICD-10-CM | POA: Diagnosis not present

## 2019-10-27 DIAGNOSIS — Z9049 Acquired absence of other specified parts of digestive tract: Secondary | ICD-10-CM | POA: Diagnosis not present

## 2019-10-27 DIAGNOSIS — D490 Neoplasm of unspecified behavior of digestive system: Secondary | ICD-10-CM | POA: Diagnosis not present

## 2019-10-27 DIAGNOSIS — K861 Other chronic pancreatitis: Secondary | ICD-10-CM | POA: Diagnosis not present

## 2019-10-27 DIAGNOSIS — Z87891 Personal history of nicotine dependence: Secondary | ICD-10-CM | POA: Diagnosis not present

## 2019-10-27 DIAGNOSIS — Z90411 Acquired partial absence of pancreas: Secondary | ICD-10-CM | POA: Diagnosis not present

## 2019-10-27 DIAGNOSIS — R197 Diarrhea, unspecified: Secondary | ICD-10-CM | POA: Diagnosis not present

## 2019-10-27 DIAGNOSIS — K8689 Other specified diseases of pancreas: Secondary | ICD-10-CM | POA: Diagnosis not present

## 2019-11-17 DIAGNOSIS — Z23 Encounter for immunization: Secondary | ICD-10-CM | POA: Diagnosis not present

## 2019-11-17 DIAGNOSIS — E1169 Type 2 diabetes mellitus with other specified complication: Secondary | ICD-10-CM | POA: Diagnosis not present

## 2019-11-17 DIAGNOSIS — R197 Diarrhea, unspecified: Secondary | ICD-10-CM | POA: Diagnosis not present

## 2019-11-17 DIAGNOSIS — S61411D Laceration without foreign body of right hand, subsequent encounter: Secondary | ICD-10-CM | POA: Diagnosis not present

## 2019-12-04 DIAGNOSIS — K8689 Other specified diseases of pancreas: Secondary | ICD-10-CM | POA: Diagnosis not present

## 2019-12-04 DIAGNOSIS — Z79899 Other long term (current) drug therapy: Secondary | ICD-10-CM | POA: Diagnosis not present

## 2019-12-04 DIAGNOSIS — Z7982 Long term (current) use of aspirin: Secondary | ICD-10-CM | POA: Diagnosis not present

## 2019-12-04 DIAGNOSIS — E119 Type 2 diabetes mellitus without complications: Secondary | ICD-10-CM | POA: Diagnosis not present

## 2019-12-04 DIAGNOSIS — Z980 Intestinal bypass and anastomosis status: Secondary | ICD-10-CM | POA: Diagnosis not present

## 2019-12-04 DIAGNOSIS — Z794 Long term (current) use of insulin: Secondary | ICD-10-CM | POA: Diagnosis not present

## 2019-12-04 DIAGNOSIS — Z87891 Personal history of nicotine dependence: Secondary | ICD-10-CM | POA: Diagnosis not present

## 2019-12-04 DIAGNOSIS — K22711 Barrett's esophagus with high grade dysplasia: Secondary | ICD-10-CM | POA: Diagnosis not present

## 2019-12-04 DIAGNOSIS — N400 Enlarged prostate without lower urinary tract symptoms: Secondary | ICD-10-CM | POA: Diagnosis not present

## 2019-12-04 DIAGNOSIS — K2270 Barrett's esophagus without dysplasia: Secondary | ICD-10-CM | POA: Diagnosis not present

## 2019-12-14 DIAGNOSIS — E1169 Type 2 diabetes mellitus with other specified complication: Secondary | ICD-10-CM | POA: Diagnosis not present

## 2019-12-14 DIAGNOSIS — K869 Disease of pancreas, unspecified: Secondary | ICD-10-CM | POA: Diagnosis not present

## 2019-12-14 DIAGNOSIS — S60519A Abrasion of unspecified hand, initial encounter: Secondary | ICD-10-CM | POA: Diagnosis not present

## 2019-12-22 ENCOUNTER — Ambulatory Visit: Payer: PPO | Admitting: Podiatry

## 2019-12-28 ENCOUNTER — Other Ambulatory Visit: Payer: Self-pay

## 2019-12-28 ENCOUNTER — Ambulatory Visit (INDEPENDENT_AMBULATORY_CARE_PROVIDER_SITE_OTHER): Payer: PPO | Admitting: Podiatry

## 2019-12-28 DIAGNOSIS — M79674 Pain in right toe(s): Secondary | ICD-10-CM

## 2019-12-28 DIAGNOSIS — Q828 Other specified congenital malformations of skin: Secondary | ICD-10-CM | POA: Diagnosis not present

## 2019-12-28 DIAGNOSIS — E119 Type 2 diabetes mellitus without complications: Secondary | ICD-10-CM | POA: Diagnosis not present

## 2019-12-28 DIAGNOSIS — M79675 Pain in left toe(s): Secondary | ICD-10-CM | POA: Diagnosis not present

## 2019-12-28 DIAGNOSIS — B351 Tinea unguium: Secondary | ICD-10-CM

## 2019-12-28 NOTE — Progress Notes (Signed)
Subjective: 86 y.o. returns the office today for painful, elongated, thickened toenails which he cannot trim himself as well as for a callus on the left foot. No open lesions and he also denies any new concerns today.    Denies any systemic complaints such as fevers, chills, nausea, vomiting.   Last A1c 8.3 (08/18/2019)  PCP: Geoffry Paradise, MD  Objective: AAO 3, NAD DP/PT pulses palpable, CRT less than 3 seconds Nails hypertrophic, dystrophic, elongated, brittle, discolored 10. There is tenderness overlying the nails 1-5 bilaterally. There is no surrounding erythema or drainage along the nail sites.  Minimal hyperkeratotic tissue left foot submetatarsal area. No ulcerations, drainage or signs of infection.  No other areas of tenderness bilateral lower extremities. No overlying edema, erythema, increased warmth. No pain with calf compression, swelling, warmth, erythema.  Assessment: Patient presents with symptomatic onychomycosis; hyperkeratotic lesion  Plan: -Treatment options including alternatives, risks, complications were discussed -Nails sharply debrided 10 without complication/bleeding. -Sharply debrided hyperkeratotic lesion x1 without any complications or bleeding.  -Discussed daily foot inspection. If there are any changes, to call the office immediately.  -Follow-up in 3 months or as needed if any problems are to arise.   Ovid Curd, DPM

## 2020-01-15 DIAGNOSIS — C44629 Squamous cell carcinoma of skin of left upper limb, including shoulder: Secondary | ICD-10-CM | POA: Diagnosis not present

## 2020-01-15 DIAGNOSIS — L821 Other seborrheic keratosis: Secondary | ICD-10-CM | POA: Diagnosis not present

## 2020-01-15 DIAGNOSIS — C44229 Squamous cell carcinoma of skin of left ear and external auricular canal: Secondary | ICD-10-CM | POA: Diagnosis not present

## 2020-01-15 DIAGNOSIS — L570 Actinic keratosis: Secondary | ICD-10-CM | POA: Diagnosis not present

## 2020-01-15 DIAGNOSIS — L578 Other skin changes due to chronic exposure to nonionizing radiation: Secondary | ICD-10-CM | POA: Diagnosis not present

## 2020-02-16 DIAGNOSIS — E1169 Type 2 diabetes mellitus with other specified complication: Secondary | ICD-10-CM | POA: Diagnosis not present

## 2020-02-16 DIAGNOSIS — R197 Diarrhea, unspecified: Secondary | ICD-10-CM | POA: Diagnosis not present

## 2020-02-16 DIAGNOSIS — E785 Hyperlipidemia, unspecified: Secondary | ICD-10-CM | POA: Diagnosis not present

## 2020-02-29 DIAGNOSIS — Z7982 Long term (current) use of aspirin: Secondary | ICD-10-CM | POA: Diagnosis not present

## 2020-02-29 DIAGNOSIS — E119 Type 2 diabetes mellitus without complications: Secondary | ICD-10-CM | POA: Diagnosis not present

## 2020-02-29 DIAGNOSIS — K219 Gastro-esophageal reflux disease without esophagitis: Secondary | ICD-10-CM | POA: Diagnosis not present

## 2020-02-29 DIAGNOSIS — K222 Esophageal obstruction: Secondary | ICD-10-CM | POA: Diagnosis not present

## 2020-02-29 DIAGNOSIS — Z79899 Other long term (current) drug therapy: Secondary | ICD-10-CM | POA: Diagnosis not present

## 2020-02-29 DIAGNOSIS — K449 Diaphragmatic hernia without obstruction or gangrene: Secondary | ICD-10-CM | POA: Diagnosis not present

## 2020-02-29 DIAGNOSIS — Z980 Intestinal bypass and anastomosis status: Secondary | ICD-10-CM | POA: Diagnosis not present

## 2020-02-29 DIAGNOSIS — K22711 Barrett's esophagus with high grade dysplasia: Secondary | ICD-10-CM | POA: Diagnosis not present

## 2020-02-29 DIAGNOSIS — N400 Enlarged prostate without lower urinary tract symptoms: Secondary | ICD-10-CM | POA: Diagnosis not present

## 2020-02-29 DIAGNOSIS — Z934 Other artificial openings of gastrointestinal tract status: Secondary | ICD-10-CM | POA: Diagnosis not present

## 2020-02-29 DIAGNOSIS — Z87891 Personal history of nicotine dependence: Secondary | ICD-10-CM | POA: Diagnosis not present

## 2020-02-29 DIAGNOSIS — K2270 Barrett's esophagus without dysplasia: Secondary | ICD-10-CM | POA: Diagnosis not present

## 2020-02-29 DIAGNOSIS — Z794 Long term (current) use of insulin: Secondary | ICD-10-CM | POA: Diagnosis not present

## 2020-03-22 DIAGNOSIS — H903 Sensorineural hearing loss, bilateral: Secondary | ICD-10-CM | POA: Diagnosis not present

## 2020-03-28 ENCOUNTER — Other Ambulatory Visit: Payer: Self-pay

## 2020-03-28 ENCOUNTER — Ambulatory Visit: Payer: PPO | Admitting: Podiatry

## 2020-03-28 DIAGNOSIS — M79674 Pain in right toe(s): Secondary | ICD-10-CM | POA: Diagnosis not present

## 2020-03-28 DIAGNOSIS — M79675 Pain in left toe(s): Secondary | ICD-10-CM | POA: Diagnosis not present

## 2020-03-28 DIAGNOSIS — E119 Type 2 diabetes mellitus without complications: Secondary | ICD-10-CM

## 2020-03-28 DIAGNOSIS — B351 Tinea unguium: Secondary | ICD-10-CM | POA: Diagnosis not present

## 2020-03-28 NOTE — Progress Notes (Signed)
Subjective: 87 y.o. returns the office today for painful, elongated, thickened toenails which he cannot trim himself as well as for a callus on the left foot. No open lesions and he also denies any new concerns today.    Denies any systemic complaints such as fevers, chills, nausea, vomiting.   Last A1c 8.3 (08/18/2019)  PCP: Burnard Bunting, MD  Objective: AAO 3, NAD DP/PT pulses palpable, CRT less than 3 seconds Pitting edema present bilaterally Nails hypertrophic, dystrophic, elongated, brittle, discolored 10. There is tenderness overlying the nails 1-5 bilaterally. There is no surrounding erythema or drainage along the nail sites.  No open lesions Dry skin present without any skin fissures or skin breakdown/drainage.  No pain with calf compression, warmth, erythema.  Assessment: Patient presents with symptomatic onychomycosis  Plan: -Treatment options including alternatives, risks, complications were discussed -Nails sharply debrided 10 without complication/bleeding. -Encouraged elevation for the swelling- this has been a chronic issue he reports. It all went away after he walked 5000 steps on Saturday but reports it is back today.  -Discussed daily foot inspection. If there are any changes, to call the office immediately.  -Follow-up in 3 months or as needed if any problems are to arise.   Celesta Gentile, DPM

## 2020-04-18 DIAGNOSIS — E1169 Type 2 diabetes mellitus with other specified complication: Secondary | ICD-10-CM | POA: Diagnosis not present

## 2020-04-18 DIAGNOSIS — K219 Gastro-esophageal reflux disease without esophagitis: Secondary | ICD-10-CM | POA: Diagnosis not present

## 2020-04-18 DIAGNOSIS — K760 Fatty (change of) liver, not elsewhere classified: Secondary | ICD-10-CM | POA: Diagnosis not present

## 2020-04-18 DIAGNOSIS — K869 Disease of pancreas, unspecified: Secondary | ICD-10-CM | POA: Diagnosis not present

## 2020-04-18 DIAGNOSIS — R197 Diarrhea, unspecified: Secondary | ICD-10-CM | POA: Diagnosis not present

## 2020-04-21 ENCOUNTER — Telehealth: Payer: Self-pay | Admitting: Internal Medicine

## 2020-04-21 NOTE — Telephone Encounter (Signed)
Hi Dr. Carlean Purl., we have received a referral from patient's PCP for evaluation of on going diarrhea. Per notes in Epic, it looks like patient transferred his care to [facility]. However, when I spoke with patient, he stated that he only goes there for pancreatic treatment. Could you please review his records in Epic and advise on scheduling? Thank you.

## 2020-04-22 DIAGNOSIS — C44629 Squamous cell carcinoma of skin of left upper limb, including shoulder: Secondary | ICD-10-CM | POA: Diagnosis not present

## 2020-04-22 DIAGNOSIS — L821 Other seborrheic keratosis: Secondary | ICD-10-CM | POA: Diagnosis not present

## 2020-04-22 DIAGNOSIS — B029 Zoster without complications: Secondary | ICD-10-CM | POA: Diagnosis not present

## 2020-04-22 DIAGNOSIS — L570 Actinic keratosis: Secondary | ICD-10-CM | POA: Diagnosis not present

## 2020-04-22 DIAGNOSIS — L578 Other skin changes due to chronic exposure to nonionizing radiation: Secondary | ICD-10-CM | POA: Diagnosis not present

## 2020-05-05 NOTE — Telephone Encounter (Signed)
OK to schedule new patient visit. 

## 2020-05-05 NOTE — Telephone Encounter (Signed)
Spoke with pt, he is going to call back to scheduled appt.

## 2020-05-20 DIAGNOSIS — H903 Sensorineural hearing loss, bilateral: Secondary | ICD-10-CM | POA: Diagnosis not present

## 2020-05-27 ENCOUNTER — Encounter: Payer: Self-pay | Admitting: Podiatry

## 2020-05-27 DIAGNOSIS — Z934 Other artificial openings of gastrointestinal tract status: Secondary | ICD-10-CM | POA: Diagnosis not present

## 2020-05-27 DIAGNOSIS — Z7984 Long term (current) use of oral hypoglycemic drugs: Secondary | ICD-10-CM | POA: Diagnosis not present

## 2020-05-27 DIAGNOSIS — Z8719 Personal history of other diseases of the digestive system: Secondary | ICD-10-CM | POA: Diagnosis not present

## 2020-05-27 DIAGNOSIS — K22711 Barrett's esophagus with high grade dysplasia: Secondary | ICD-10-CM | POA: Diagnosis not present

## 2020-05-27 DIAGNOSIS — K8681 Exocrine pancreatic insufficiency: Secondary | ICD-10-CM | POA: Diagnosis not present

## 2020-05-27 DIAGNOSIS — Z09 Encounter for follow-up examination after completed treatment for conditions other than malignant neoplasm: Secondary | ICD-10-CM | POA: Diagnosis not present

## 2020-05-27 DIAGNOSIS — K2289 Other specified disease of esophagus: Secondary | ICD-10-CM | POA: Diagnosis not present

## 2020-05-27 DIAGNOSIS — K208 Other esophagitis without bleeding: Secondary | ICD-10-CM | POA: Diagnosis not present

## 2020-05-27 DIAGNOSIS — Z87891 Personal history of nicotine dependence: Secondary | ICD-10-CM | POA: Diagnosis not present

## 2020-05-27 DIAGNOSIS — Z9049 Acquired absence of other specified parts of digestive tract: Secondary | ICD-10-CM | POA: Diagnosis not present

## 2020-05-27 DIAGNOSIS — Z90411 Acquired partial absence of pancreas: Secondary | ICD-10-CM | POA: Diagnosis not present

## 2020-05-27 DIAGNOSIS — Z980 Intestinal bypass and anastomosis status: Secondary | ICD-10-CM | POA: Diagnosis not present

## 2020-05-27 DIAGNOSIS — E119 Type 2 diabetes mellitus without complications: Secondary | ICD-10-CM | POA: Diagnosis not present

## 2020-06-16 DIAGNOSIS — E1169 Type 2 diabetes mellitus with other specified complication: Secondary | ICD-10-CM | POA: Diagnosis not present

## 2020-06-28 ENCOUNTER — Ambulatory Visit: Payer: PPO | Admitting: Podiatry

## 2020-07-11 ENCOUNTER — Ambulatory Visit: Payer: PPO | Admitting: Podiatry

## 2020-07-14 ENCOUNTER — Other Ambulatory Visit: Payer: Self-pay

## 2020-07-14 ENCOUNTER — Ambulatory Visit (INDEPENDENT_AMBULATORY_CARE_PROVIDER_SITE_OTHER): Payer: PPO | Admitting: Podiatry

## 2020-07-14 DIAGNOSIS — M79675 Pain in left toe(s): Secondary | ICD-10-CM

## 2020-07-14 DIAGNOSIS — M79674 Pain in right toe(s): Secondary | ICD-10-CM

## 2020-07-14 DIAGNOSIS — B351 Tinea unguium: Secondary | ICD-10-CM | POA: Diagnosis not present

## 2020-07-14 DIAGNOSIS — E119 Type 2 diabetes mellitus without complications: Secondary | ICD-10-CM | POA: Diagnosis not present

## 2020-07-15 NOTE — Progress Notes (Signed)
Subjective: 87 y.o. returns the office today for painful, elongated, thickened toenails which he cannot trim himself.  He states the callus is doing much better not come back.  No open lesions and he also denies any new concerns today.    Denies any systemic complaints such as fevers, chills, nausea, vomiting.   PCP: Burnard Bunting, MD  Objective: AAO 3, NAD DP/PT pulses palpable, CRT less than 3 seconds Nails hypertrophic, dystrophic, elongated, brittle, discolored 10. There is tenderness overlying the nails 1-5 bilaterally. There is no surrounding erythema or drainage along the nail sites.  No open lesions Dry skin present without any skin fissures or skin breakdown/drainage.  No pain with calf compression, warmth, erythema.  Assessment: Patient presents with symptomatic onychomycosis  Plan: -Treatment options including alternatives, risks, complications were discussed -Nails sharply debrided 10 without complication/bleeding. -Discussed daily foot inspection. If there are any changes, to call the office immediately.  -Follow-up in 3 months or as needed if any problems are to arise.   Celesta Gentile, DPM

## 2020-08-11 ENCOUNTER — Other Ambulatory Visit: Payer: Self-pay

## 2020-08-11 ENCOUNTER — Inpatient Hospital Stay (HOSPITAL_COMMUNITY)
Admission: EM | Admit: 2020-08-11 | Discharge: 2020-08-15 | DRG: 683 | Disposition: A | Discharge status: Home / Self Care | Payer: PPO | Attending: Internal Medicine | Admitting: Internal Medicine

## 2020-08-11 ENCOUNTER — Encounter (HOSPITAL_COMMUNITY): Payer: Self-pay

## 2020-08-11 ENCOUNTER — Emergency Department (HOSPITAL_COMMUNITY): Payer: PPO

## 2020-08-11 DIAGNOSIS — Z87891 Personal history of nicotine dependence: Secondary | ICD-10-CM

## 2020-08-11 DIAGNOSIS — Z833 Family history of diabetes mellitus: Secondary | ICD-10-CM | POA: Diagnosis not present

## 2020-08-11 DIAGNOSIS — A029 Salmonella infection, unspecified: Secondary | ICD-10-CM | POA: Diagnosis not present

## 2020-08-11 DIAGNOSIS — Z9049 Acquired absence of other specified parts of digestive tract: Secondary | ICD-10-CM | POA: Diagnosis not present

## 2020-08-11 DIAGNOSIS — E872 Acidosis, unspecified: Secondary | ICD-10-CM

## 2020-08-11 DIAGNOSIS — E876 Hypokalemia: Secondary | ICD-10-CM | POA: Diagnosis not present

## 2020-08-11 DIAGNOSIS — K760 Fatty (change of) liver, not elsewhere classified: Secondary | ICD-10-CM | POA: Diagnosis not present

## 2020-08-11 DIAGNOSIS — E860 Dehydration: Secondary | ICD-10-CM | POA: Diagnosis present

## 2020-08-11 DIAGNOSIS — E871 Hypo-osmolality and hyponatremia: Secondary | ICD-10-CM | POA: Diagnosis present

## 2020-08-11 DIAGNOSIS — Z90411 Acquired partial absence of pancreas: Secondary | ICD-10-CM

## 2020-08-11 DIAGNOSIS — E1165 Type 2 diabetes mellitus with hyperglycemia: Secondary | ICD-10-CM | POA: Diagnosis present

## 2020-08-11 DIAGNOSIS — E861 Hypovolemia: Secondary | ICD-10-CM | POA: Diagnosis not present

## 2020-08-11 DIAGNOSIS — E1169 Type 2 diabetes mellitus with other specified complication: Secondary | ICD-10-CM | POA: Diagnosis not present

## 2020-08-11 DIAGNOSIS — R1111 Vomiting without nausea: Secondary | ICD-10-CM | POA: Diagnosis not present

## 2020-08-11 DIAGNOSIS — N179 Acute kidney failure, unspecified: Secondary | ICD-10-CM | POA: Diagnosis not present

## 2020-08-11 DIAGNOSIS — R112 Nausea with vomiting, unspecified: Secondary | ICD-10-CM | POA: Diagnosis not present

## 2020-08-11 DIAGNOSIS — R197 Diarrhea, unspecified: Secondary | ICD-10-CM | POA: Diagnosis not present

## 2020-08-11 DIAGNOSIS — E785 Hyperlipidemia, unspecified: Secondary | ICD-10-CM | POA: Diagnosis not present

## 2020-08-11 DIAGNOSIS — Z7984 Long term (current) use of oral hypoglycemic drugs: Secondary | ICD-10-CM | POA: Diagnosis not present

## 2020-08-11 DIAGNOSIS — N400 Enlarged prostate without lower urinary tract symptoms: Secondary | ICD-10-CM | POA: Diagnosis present

## 2020-08-11 DIAGNOSIS — R059 Cough, unspecified: Secondary | ICD-10-CM | POA: Diagnosis not present

## 2020-08-11 DIAGNOSIS — Z809 Family history of malignant neoplasm, unspecified: Secondary | ICD-10-CM | POA: Diagnosis not present

## 2020-08-11 DIAGNOSIS — E119 Type 2 diabetes mellitus without complications: Secondary | ICD-10-CM | POA: Diagnosis not present

## 2020-08-11 DIAGNOSIS — K219 Gastro-esophageal reflux disease without esophagitis: Secondary | ICD-10-CM | POA: Diagnosis not present

## 2020-08-11 DIAGNOSIS — K7689 Other specified diseases of liver: Secondary | ICD-10-CM | POA: Diagnosis not present

## 2020-08-11 DIAGNOSIS — Z20822 Contact with and (suspected) exposure to covid-19: Secondary | ICD-10-CM | POA: Diagnosis not present

## 2020-08-11 DIAGNOSIS — N281 Cyst of kidney, acquired: Secondary | ICD-10-CM | POA: Diagnosis not present

## 2020-08-11 DIAGNOSIS — R531 Weakness: Secondary | ICD-10-CM | POA: Diagnosis not present

## 2020-08-11 DIAGNOSIS — R739 Hyperglycemia, unspecified: Secondary | ICD-10-CM | POA: Diagnosis not present

## 2020-08-11 LAB — LIPASE, BLOOD: Lipase: 17 U/L (ref 11–51)

## 2020-08-11 LAB — RESP PANEL BY RT-PCR (FLU A&B, COVID) ARPGX2
Influenza A by PCR: NEGATIVE
Influenza B by PCR: NEGATIVE
SARS Coronavirus 2 by RT PCR: NEGATIVE

## 2020-08-11 LAB — COMPREHENSIVE METABOLIC PANEL
ALT: 21 U/L (ref 0–44)
AST: 18 U/L (ref 15–41)
Albumin: 3 g/dL — ABNORMAL LOW (ref 3.5–5.0)
Alkaline Phosphatase: 105 U/L (ref 38–126)
Anion gap: 20 — ABNORMAL HIGH (ref 5–15)
BUN: 57 mg/dL — ABNORMAL HIGH (ref 8–23)
CO2: 15 mmol/L — ABNORMAL LOW (ref 22–32)
Calcium: 8.1 mg/dL — ABNORMAL LOW (ref 8.9–10.3)
Chloride: 95 mmol/L — ABNORMAL LOW (ref 98–111)
Creatinine, Ser: 4.1 mg/dL — ABNORMAL HIGH (ref 0.61–1.24)
GFR, Estimated: 14 mL/min — ABNORMAL LOW (ref >60)
Glucose, Bld: 254 mg/dL — ABNORMAL HIGH (ref 70–99)
Potassium: 2.4 mmol/L — CL (ref 3.5–5.1)
Sodium: 130 mmol/L — ABNORMAL LOW (ref 135–145)
Total Bilirubin: 0.7 mg/dL (ref 0.3–1.2)
Total Protein: 6.6 g/dL (ref 6.5–8.1)

## 2020-08-11 MED ORDER — ONDANSETRON HCL 4 MG/2ML IJ SOLN
4.0000 mg | Freq: Once | INTRAMUSCULAR | Status: AC
  Administered 2020-08-11: 4 mg via INTRAVENOUS
  Filled 2020-08-11: qty 2

## 2020-08-11 MED ORDER — SODIUM CHLORIDE 0.9 % IV BOLUS
1000.0000 mL | Freq: Once | INTRAVENOUS | Status: AC
  Administered 2020-08-11: 1000 mL via INTRAVENOUS

## 2020-08-11 NOTE — ED Provider Notes (Signed)
87 year old male received as signout from [place] pending labs and likely admission. Per admission:   "Mr. Francisco [name] is an 87 y/o male with PMH of T2DM and HLD brought in via EMS from home for weakness, nausea/vomiting, and diarrhea. Patient saw PCP today for 2 days of weakness, nausea, vomiting multiple times per day, and diarrhea every hour. Patient reports a fall last week and fell on his chest, with some residual shortness of breath due to pain. Patient not able to keep down food as well as his medications in 2 days. Has not tried anything for his symptoms besides fluids. Denies fevers, chills, chest pain, lightheadedness, syncopal episodes, abdominal pain, blood in stool, or changes in urination. Denies sick contacts, recent travel, or recent antibiotic use.   At PCP patient was diagnosed with acute renal failure with a creatinine of 4 and BUN of 49. Prior records show patient's Cr to be around 1. PCP recommended patient be seen in the ED for this. Patient was told by PCP he was down 17 lbs since last visit."  Physical Exam  BP 128/77   Pulse 77   Temp (!) 97.4 F (36.3 C) (Oral)   Resp (!) 21   Ht '5\' 6"'$  (1.676 m)   Wt 53.1 kg   SpO2 97%   BMI 18.88 kg/m   Physical Exam Vitals and nursing note reviewed.  Constitutional:      Appearance: He is well-developed. He is not toxic-appearing.     Comments: Chronically ill-appearing  HENT:     Head: Normocephalic and atraumatic.     Mouth/Throat:     Comments: Lips are dry Pulmonary:     Effort: Pulmonary effort is normal.  Abdominal:     Comments: Abdomen soft, nontender, nondistended.  Musculoskeletal:     Cervical back: Neck supple.  Skin:    General: Skin is warm.     Capillary Refill: Capillary refill takes 2 to 3 seconds.  Neurological:     Mental Status: He is alert and oriented to person, place, and time.     Cranial Nerves: No cranial nerve deficit.  Psychiatric:        Behavior: Behavior normal.    ED  Course/Procedures   Clinical Course as of 08/12/20 0046  Thu Aug 11, 2020  2335 Patient rechecked.  He is requesting something to drink.  He states that he is receiving enteroscopy's at [facility] as they are following him for Barrett's esophagus.  Last procedure was in May 2022.  He is unsure if he has recently been on antibiotics.  He asked me to refer back to his home medication list as he is unsure.  He is on Lasix for leg swelling, but is unsure how long he has been taking the medication.  He denies ever having been dosed with congestive heart failure. [MM]  Fri Aug 12, 2020  0028 RN reports that patient has had 3 watery bowel movements since he arrived in the emergency department 3 hours ago.  We will add on GI pathogen testing as well as C. difficile testing since patient has had routine abdominal procedure within the last 2 months. [MM]    Clinical Course User Index [MM] Leidi Astle A, PA-C    .Critical Care  Date/Time: 08/12/2020 12:33 AM Performed by: Joanne Gavel, PA-C Authorized by: Joanne Gavel, PA-C   Critical care provider statement:    Critical care time (minutes):  35   Critical care time was exclusive of:  Separately billable procedures and treating other patients and teaching time   Critical care was necessary to treat or prevent imminent or life-threatening deterioration of the following conditions:  Dehydration, metabolic crisis and renal failure   Critical care was time spent personally by me on the following activities:  Ordering and performing treatments and interventions, ordering and review of laboratory studies, ordering and review of radiographic studies, pulse oximetry, re-evaluation of patient's condition, obtaining history from patient or surrogate, examination of patient, evaluation of patient's response to treatment and development of treatment plan with patient or surrogate   I assumed direction of critical care for this patient from another provider in my  specialty: yes     Care discussed with: admitting provider    MDM   87 year old male received at signout from PA [place].  Please see her note for further work-up and medical decision making.  In brief, this is a patient with a 2-day history of nausea, vomiting, and diarrhea.  He was seen by his PCP earlier today and found to have a creatinine of 4 and was advised to come to the emergency department for further evaluation.  Vital stable in the ED.  Labs and imaging been reviewed and independently interpreted by me.  No acute findings on CT abdomen pelvis, specifically no evidence of colitis.  He does have a massively enlarged prostate with mass-effect on the bladder, but bladder is not distended.  No concern for urinary retention at this time.  Chest x-ray is unremarkable.   Creatinine is 4.1 in the ED today.  This is significantly increased from labs in September 2021 when his creatinine was 0.89.  He is hypokalemic, likely secondary to his symptoms.  We will add on magnesium level, which is pending.  We will initiate replenishment of potassium with 2 rounds of potassium chloride and 20 mg of oral potassium chloride as well as 2 g of magnesium.  He does have a metabolic acidosis with an elevated anion gap of 20.  Bicarb is 15.  He has been given a 1 L bolus of normal saline.  We will start him on an infusion of normal saline at 150 cc/h.  RN informs me that the patient has had 3 watery bowel movements in the 3 hours that the patient has been in the emergency department.  We will add on C. difficile and GI pathogen testing.  Patient reports that nausea has resolved at this time.  However, given the severity of his symptoms, he will require further work-up and evaluation inpatient.  Consult to the hospitalist team and Dr. Hal Hope will accept the patient for admission.  The patient appears reasonably stabilized for admission considering the current resources, flow, and capabilities available in the  ED at this time, and I doubt any other [facility] requiring further screening and/or treatment in the ED prior to admission.       Joanne Gavel, PA-C 08/12/20 0046    Quintella Reichert, MD 08/12/20 [phone]

## 2020-08-11 NOTE — ED Provider Notes (Signed)
[facility] DEPT Provider Note   CSN: [id number] Arrival date & time: 08/11/20  2128     History Chief Complaint  Patient presents with   Nausea   Cough   Diarrhea   Emesis    Francisco [name] is a 87 y.o. male.  Francisco [name] is an 87 y/o male with PMH of T2DM and HLD brought in via EMS from home for weakness, nausea/vomiting, and diarrhea. Patient saw PCP today for 2 days of weakness, nausea, vomiting multiple times per day, and diarrhea every hour. Patient reports a fall last week and fell on his chest, with some residual shortness of breath due to pain. Patient not able to keep down food as well as his medications in 2 days. Has not tried anything for his symptoms besides fluids. Denies fevers, chills, chest pain, lightheadedness, syncopal episodes, abdominal pain, blood in stool, or changes in urination. Denies sick contacts, recent travel, or recent antibiotic use.  At PCP patient was diagnosed with acute renal failure with a creatinine of 4 and BUN of 49. Prior records show patient's Cr to be around 1. PCP recommended patient be seen in the ED for this. Patient was told by PCP he was down 17 lbs since last visit.   The history is provided by the patient.  Cough Associated symptoms: no chest pain, no fever and no shortness of breath   Diarrhea Quality:  Watery Severity:  Moderate Onset quality:  Sudden Number of episodes:  Once every hour Duration:  2 days Timing:  Constant Progression:  Unchanged Relieved by:  Nothing Associated symptoms: vomiting   Associated symptoms: no abdominal pain and no fever   Risk factors: no recent antibiotic use, no sick contacts and no travel to endemic areas   Emesis Severity:  Moderate Duration:  2 days Timing:  Constant Quality:  Stomach contents Able to tolerate:  Liquids Progression:  Unchanged Chronicity:  New Relieved by:  Nothing Ineffective treatments:  Liquids Associated symptoms: cough and  diarrhea   Associated symptoms: no abdominal pain and no fever   Risk factors: diabetes   Risk factors: no sick contacts, no suspect food intake and no travel to endemic areas       Past Medical History:  Diagnosis Date   Asthma with allergic rhinitis    BPH (benign prostatic hyperplasia)    DM (diabetes mellitus) (HCC)    Elevated CEA    Elevated prostate specific antigen (PSA)    Fatty liver    GERD (gastroesophageal reflux disease)    Hx of adenomatous colonic polyps 02/14/2016   10 adenomas max 15 mm 02/2016 - no recall at his age   Hyperlipidemia    Impotence of organic origin    Neoplasm of uncertain behavior of prostate    Pancreatic cyst FOLLOWED BY DR Ardis Hughs   Personal history of urinary calculi    Renal calculi 09/19/2011   Right ureteral stone     Patient Active Problem List   Diagnosis Date Noted   Diabetes mellitus type 2, uncomplicated ([place]) [id number]   Internal Gr 2 and Gr 1 external bleeding hemorrhoids 05/23/2016   Hx of adenomatous colonic polyps 02/14/2016   Benign prostatic hyperplasia 11/26/2011   Renal calculi 09/19/2011   IPMN (intraductal papillary mucinous neoplasm), probable main duct, solid component.   05/14/2011    Past Surgical History:  Procedure Laterality Date   CHOLECYSTECTOMY     COLONOSCOPY W/ POLYPECTOMY  2018   CYSTOSCOPY WITH RETROGRADE  PYELOGRAM, URETEROSCOPY AND STENT PLACEMENT Right 08/19/2019   Procedure: CYSTOSCOPY WITH RETROGRADE PYELOGRAM, URETEROSCOPY, LASER LITHOTRIPSY STENT PLACEMENT FIRST STAGE;  Surgeon: Alexis Frock, MD;  Location: [facility];  Service: Urology;  Laterality: Right;  38 MINS   CYSTOSCOPY WITH RETROGRADE PYELOGRAM, URETEROSCOPY AND STENT PLACEMENT Right 09/04/2019   Procedure: CYSTOSCOPY WITH RETROGRADE PYELOGRAM, BASKETING OF STONES URETEROSCOPY AND STENT EXCHANGE;  Surgeon: Alexis Frock, MD;  Location: [facility];  Service: Urology;  Laterality: Right;  60 MINS   CYSTOSCOPY WITH URETEROSCOPY  03/14/2011    Procedure: CYSTOSCOPY WITH URETEROSCOPY;  Surgeon: Molli Hazard, MD;  Location: [facility];  Service: Urology;  Laterality: Right;  2 hours requested for this case  Right Ureter Stent Placement  C-ARM CAMERA DIGITAL URETEROSCOPE     EUS  04/12/2011   Procedure: UPPER ENDOSCOPIC ULTRASOUND (EUS) LINEAR;  Surgeon: Milus Banister, MD;  Location: [facility];  Service: Endoscopy;  Laterality: N/A;  radial linear/pt moved up a hour early by AW , Patty @ [facility] office ok'd & pt was called by AW   HOLMIUM LASER APPLICATION Right [id number]   Procedure: HOLMIUM LASER APPLICATION;  Surgeon: Alexis Frock, MD;  Location: [facility];  Service: Urology;  Laterality: Right;   KIDNEY STONE SURGERY  March 2013   PERCUTANEOUS NEPHROSTOLITHOTOMY  1985   WHIPPLE PROCEDURE  12/2011   [facility]       Family History  Problem Relation Age of Onset   Diabetes Mother    Diabetes Sister    Cancer Brother        unknown type   Stroke Father     Social History   Tobacco Use   Smoking status: Former    Years: 20.00    Types: Cigarettes    Quit date: 04/11/1991    Years since quitting: 29.3   Smokeless tobacco: Never  Vaping Use   Vaping Use: Never used  Substance Use Topics   Alcohol use: Yes    Comment: 1 can every 6 months or so/rare   Drug use: No    Home Medications Prior to Admission medications   Medication Sig Start Date End Date Taking? Authorizing Provider  ACCU-CHEK FASTCLIX LANCETS MISC by Does not apply route daily.    [provider]  APPLE CIDER VINEGAR PO Take 1 capsule by mouth daily.     [provider]  cephALEXin (KEFLEX) 500 MG capsule Take 1 capsule (500 mg total) by mouth 2 (two) times daily. X 3 days to prevent infection with tethered stent in place. Patient not taking: Reported on 09/22/2019 09/04/19   Alexis Frock, MD  cholestyramine Lucrezia Starch) 4 g packet Take 1 packet by mouth 2 (two) times daily. 09/14/19   [provider]  CINNAMON PO Take 2,000 mg by mouth daily.    [provider]  empagliflozin (JARDIANCE) 10 MG TABS tablet Take 5 mg by mouth daily.    [provider]  esomeprazole (NEXIUM) 40 MG capsule Take 40 mg by mouth 2 (two) times daily.    [provider]  finasteride (PROSCAR) 5 MG tablet Take 5 mg by mouth daily.    [provider]  ketorolac (TORADOL) 10 MG tablet Take 1 tablet (10 mg total) by mouth every 8 (eight) hours as needed for moderate pain. Or stent discomfort post-operatively Patient not taking: Reported on 09/22/2019 09/04/19   Alexis Frock, MD  Multiple Vitamin (MULTIVITAMIN) tablet Take 1 tablet by mouth daily.    [provider]  Probiotic Product (ALIGN) 4 MG CAPS Take 4 mg by mouth daily.     [provider]  tamsulosin (FLOMAX) 0.4 MG CAPS capsule Take 0.4 mg by mouth 2 (two) times daily.    [provider]  traMADol (ULTRAM) 50 MG tablet Take 1-2 tablets (50-100 mg total) by mouth every 6 (six) hours as needed for severe pain. Post-operatively Patient not taking: Reported on 09/22/2019 09/04/19   Alexis Frock, MD  triamcinolone cream (KENALOG) 0.1 % Apply 1 application topically 3 (three) times daily as needed (itching). Patient not taking: Reported on 09/22/2019    [provider]    Allergies    Patient has no known allergies.  Review of Systems   Review of Systems  Constitutional:  Positive for appetite change. Negative for fever.  Respiratory:  Positive for cough. Negative for shortness of breath.   Cardiovascular:  Negative for chest pain.  Gastrointestinal:  Positive for diarrhea, nausea and vomiting. Negative for abdominal pain and blood in stool.  Genitourinary:  Negative for dysuria.  Neurological:  Positive for weakness.  All other systems reviewed and are negative.  Physical Exam Updated Vital Signs BP 125/74   Pulse 88   Temp (!) 97.4 F (36.3 C) (Oral)   Resp 20   Ht '5\' 6"'$  (1.676  m)   Wt 53.1 kg   SpO2 96%   BMI 18.88 kg/m   Physical Exam Vitals and nursing note reviewed.  Constitutional:      Appearance: He is ill-appearing.  HENT:     Head: Normocephalic and atraumatic.     Mouth/Throat:     Mouth: Mucous membranes are dry.     Pharynx: Oropharynx is clear.  Cardiovascular:     Rate and Rhythm: Normal rate and regular rhythm.  Pulmonary:     Effort: Pulmonary effort is normal.     Breath sounds: Normal breath sounds.  Abdominal:     General: There is no distension.     Palpations: Abdomen is soft.     Tenderness: There is no abdominal tenderness.  Musculoskeletal:     Cervical back: Normal range of motion.     Right lower leg: No edema.     Left lower leg: No edema.  Skin:    General: Skin is warm and dry.  Neurological:     General: No focal deficit present.     Mental Status: He is alert and oriented to person, place, and time.  Psychiatric:        Mood and Affect: Mood normal.        Behavior: Behavior normal.    ED Results / Procedures / Treatments   Labs (all labs ordered are listed, but only abnormal results are displayed) Labs Reviewed  RESP PANEL BY RT-PCR (FLU A&B, COVID) [id number]  CBC WITH DIFFERENTIAL/PLATELET  COMPREHENSIVE METABOLIC PANEL  LIPASE, BLOOD  URINALYSIS, ROUTINE W REFLEX MICROSCOPIC  CBC WITH DIFFERENTIAL/PLATELET    EKG None  Radiology DG Chest 2 View  Result Date: 08/11/2020 CLINICAL DATA:  Cough EXAM: CHEST - 2 VIEW COMPARISON:  None. FINDINGS: The heart size and mediastinal contours are within normal limits. Both lungs are clear. The visualized skeletal structures are unremarkable. IMPRESSION: No active cardiopulmonary disease. Electronically Signed   By: Rolm Baptise M.D.   On: 08/11/2020 22:53    Procedures Procedures   Medications Ordered in ED Medications  sodium chloride 0.9 % bolus 1,000 mL (1,000 mLs Intravenous New Bag/Given 08/11/20 2250)  ondansetron (  ZOFRAN) injection 4 mg (4 mg Intravenous  Given 08/11/20 2250)    ED Course  I have reviewed the triage vital signs and the nursing notes.  Pertinent labs & imaging results that were available during my care of the patient were reviewed by me and considered in my medical decision making (see chart for details).    MDM Rules/Calculators/A&P                           Patient is 87 y/o male presenting for nausea, vomiting, and diarrhea for 2 days. Patient seen at PCP and diagnosed with acute renal failure with creatinine of 4, likely due to dehydration. Patient unable to keep down food or medications for 2 days. No sick contacts, recent travel or recent antibiotic use. Differential including infectious diarrhea, colitis, foodborne illness, ischemic bowel, viral gastroenteritis, COVID, appendicitis, diverticulitis, pancreatitis.  On exam patient appears dry, received 1L of NS. Patient given Zofran for persistent nausea. Since patient reports recent fall and some difficulty breathing, CXR ordered with no acute findings. CT abdomen/pelvis ordered without contrast due to patient's poor renal function. All other lab work pending.   At shift change, patient signed out to [employer]. Patient will likely need admission for inpatient treatment for nausea, vomiting, diarrhea, and dehydration.  Final Clinical Impression(s) / ED Diagnoses Final diagnoses:  Nausea vomiting and diarrhea  Dehydration    Rx / DC Orders ED Discharge Orders     None        Estill Cotta 08/11/20 2339    Regan Lemming, MD 08/12/20 2317

## 2020-08-11 NOTE — ED Triage Notes (Signed)
BIB EMS from home for N/V/D and generalized weakness x2days. Pt saw PCP today and was told to come to ED for abnormal labs.

## 2020-08-12 ENCOUNTER — Encounter (HOSPITAL_COMMUNITY): Payer: Self-pay | Admitting: Internal Medicine

## 2020-08-12 DIAGNOSIS — Z809 Family history of malignant neoplasm, unspecified: Secondary | ICD-10-CM | POA: Diagnosis not present

## 2020-08-12 DIAGNOSIS — E871 Hypo-osmolality and hyponatremia: Secondary | ICD-10-CM | POA: Diagnosis present

## 2020-08-12 DIAGNOSIS — Z90411 Acquired partial absence of pancreas: Secondary | ICD-10-CM | POA: Diagnosis not present

## 2020-08-12 DIAGNOSIS — E861 Hypovolemia: Secondary | ICD-10-CM | POA: Diagnosis present

## 2020-08-12 DIAGNOSIS — K219 Gastro-esophageal reflux disease without esophagitis: Secondary | ICD-10-CM | POA: Diagnosis present

## 2020-08-12 DIAGNOSIS — N179 Acute kidney failure, unspecified: Principal | ICD-10-CM

## 2020-08-12 DIAGNOSIS — R112 Nausea with vomiting, unspecified: Secondary | ICD-10-CM | POA: Diagnosis not present

## 2020-08-12 DIAGNOSIS — E876 Hypokalemia: Secondary | ICD-10-CM | POA: Diagnosis present

## 2020-08-12 DIAGNOSIS — N400 Enlarged prostate without lower urinary tract symptoms: Secondary | ICD-10-CM | POA: Diagnosis present

## 2020-08-12 DIAGNOSIS — E860 Dehydration: Secondary | ICD-10-CM | POA: Diagnosis present

## 2020-08-12 DIAGNOSIS — E119 Type 2 diabetes mellitus without complications: Secondary | ICD-10-CM

## 2020-08-12 DIAGNOSIS — Z87891 Personal history of nicotine dependence: Secondary | ICD-10-CM | POA: Diagnosis not present

## 2020-08-12 DIAGNOSIS — K760 Fatty (change of) liver, not elsewhere classified: Secondary | ICD-10-CM | POA: Diagnosis present

## 2020-08-12 DIAGNOSIS — R197 Diarrhea, unspecified: Secondary | ICD-10-CM

## 2020-08-12 DIAGNOSIS — Z7984 Long term (current) use of oral hypoglycemic drugs: Secondary | ICD-10-CM | POA: Diagnosis not present

## 2020-08-12 DIAGNOSIS — E785 Hyperlipidemia, unspecified: Secondary | ICD-10-CM | POA: Diagnosis present

## 2020-08-12 DIAGNOSIS — A029 Salmonella infection, unspecified: Secondary | ICD-10-CM | POA: Diagnosis present

## 2020-08-12 DIAGNOSIS — E872 Acidosis: Secondary | ICD-10-CM | POA: Diagnosis present

## 2020-08-12 DIAGNOSIS — Z9049 Acquired absence of other specified parts of digestive tract: Secondary | ICD-10-CM | POA: Diagnosis not present

## 2020-08-12 DIAGNOSIS — Z833 Family history of diabetes mellitus: Secondary | ICD-10-CM | POA: Diagnosis not present

## 2020-08-12 DIAGNOSIS — E1165 Type 2 diabetes mellitus with hyperglycemia: Secondary | ICD-10-CM | POA: Diagnosis present

## 2020-08-12 DIAGNOSIS — Z20822 Contact with and (suspected) exposure to covid-19: Secondary | ICD-10-CM | POA: Diagnosis present

## 2020-08-12 LAB — URINALYSIS, ROUTINE W REFLEX MICROSCOPIC
Bilirubin Urine: NEGATIVE
Glucose, UA: NEGATIVE mg/dL
Ketones, ur: 5 mg/dL — AB
Nitrite: POSITIVE — AB
Protein, ur: 30 mg/dL — AB
Specific Gravity, Urine: 1.014 (ref 1.005–1.030)
WBC, UA: >50 WBC/hpf — ABNORMAL HIGH (ref 0–5)
pH: 5 (ref 5.0–8.0)

## 2020-08-12 LAB — CBC WITH DIFFERENTIAL/PLATELET
Abs Immature Granulocytes: 0 10*3/uL (ref 0.00–0.07)
Band Neutrophils: 2 %
Basophils Absolute: 0 10*3/uL (ref 0.0–0.1)
Basophils Relative: 0 %
Eosinophils Absolute: 0 10*3/uL (ref 0.0–0.5)
Eosinophils Relative: 0 %
HCT: 48.9 % (ref 39.0–52.0)
Hemoglobin: 16.2 g/dL (ref 13.0–17.0)
Lymphocytes Relative: 6 %
Lymphs Abs: 0.3 10*3/uL — ABNORMAL LOW (ref 0.7–4.0)
MCH: 29.3 pg (ref 26.0–34.0)
MCHC: 33.1 g/dL (ref 30.0–36.0)
MCV: 88.6 fL (ref 80.0–100.0)
Monocytes Absolute: 0.2 10*3/uL (ref 0.1–1.0)
Monocytes Relative: 3 %
Neutro Abs: 4.7 10*3/uL (ref 1.7–7.7)
Neutrophils Relative %: 89 %
Platelets: 241 10*3/uL (ref 150–400)
RBC: 5.52 MIL/uL (ref 4.22–5.81)
RDW: 13.9 % (ref 11.5–15.5)
WBC: 5.2 10*3/uL (ref 4.0–10.5)
nRBC: 0 % (ref 0.0–0.2)

## 2020-08-12 LAB — CBC
HCT: 43.9 % (ref 39.0–52.0)
Hemoglobin: 14.8 g/dL (ref 13.0–17.0)
MCH: 29.8 pg (ref 26.0–34.0)
MCHC: 33.7 g/dL (ref 30.0–36.0)
MCV: 88.3 fL (ref 80.0–100.0)
Platelets: 227 10*3/uL (ref 150–400)
RBC: 4.97 MIL/uL (ref 4.22–5.81)
RDW: 13.9 % (ref 11.5–15.5)
WBC: 5 10*3/uL (ref 4.0–10.5)
nRBC: 0 % (ref 0.0–0.2)

## 2020-08-12 LAB — GASTROINTESTINAL PANEL BY PCR, STOOL (REPLACES STOOL CULTURE)

## 2020-08-12 LAB — CBG MONITORING, ED
Glucose-Capillary: 152 mg/dL — ABNORMAL HIGH (ref 70–99)
Glucose-Capillary: 132 mg/dL — ABNORMAL HIGH (ref 70–99)

## 2020-08-12 LAB — COMPREHENSIVE METABOLIC PANEL
ALT: 19 U/L (ref 0–44)
AST: 18 U/L (ref 15–41)
Albumin: 2.7 g/dL — ABNORMAL LOW (ref 3.5–5.0)
Alkaline Phosphatase: 90 U/L (ref 38–126)
Anion gap: 15 (ref 5–15)
BUN: 55 mg/dL — ABNORMAL HIGH (ref 8–23)
CO2: 14 mmol/L — ABNORMAL LOW (ref 22–32)
Calcium: 7.9 mg/dL — ABNORMAL LOW (ref 8.9–10.3)
Chloride: 103 mmol/L (ref 98–111)
Creatinine, Ser: 3.2 mg/dL — ABNORMAL HIGH (ref 0.61–1.24)
GFR, Estimated: 18 mL/min — ABNORMAL LOW (ref >60)
Glucose, Bld: 166 mg/dL — ABNORMAL HIGH (ref 70–99)
Potassium: 2.5 mmol/L — CL (ref 3.5–5.1)
Sodium: 132 mmol/L — ABNORMAL LOW (ref 135–145)
Total Bilirubin: 0.7 mg/dL (ref 0.3–1.2)
Total Protein: 6.1 g/dL — ABNORMAL LOW (ref 6.5–8.1)

## 2020-08-12 LAB — GLUCOSE, CAPILLARY
Glucose-Capillary: 199 mg/dL — ABNORMAL HIGH (ref 70–99)
Glucose-Capillary: 224 mg/dL — ABNORMAL HIGH (ref 70–99)

## 2020-08-12 LAB — C DIFFICILE QUICK SCREEN W PCR REFLEX
C Diff antigen: NEGATIVE
C Diff interpretation: NOT DETECTED
C Diff toxin: NEGATIVE

## 2020-08-12 LAB — MAGNESIUM: Magnesium: 2.3 mg/dL (ref 1.7–2.4)

## 2020-08-12 MED ORDER — POTASSIUM CHLORIDE CRYS ER 20 MEQ PO TBCR
20.0000 meq | EXTENDED_RELEASE_TABLET | Freq: Once | ORAL | Status: AC
  Administered 2020-08-12: 20 meq via ORAL
  Filled 2020-08-12: qty 1

## 2020-08-12 MED ORDER — SODIUM CHLORIDE 0.9 % IV SOLN: INTRAVENOUS | Status: AC

## 2020-08-12 MED ORDER — SODIUM CHLORIDE 0.9 % IV SOLN: Freq: Once | INTRAVENOUS | Status: AC

## 2020-08-12 MED ORDER — MAGNESIUM OXIDE -MG SUPPLEMENT 400 (240 MG) MG PO TABS: 400.0000 mg | ORAL_TABLET | Freq: Two times a day (BID) | ORAL | Status: DC

## 2020-08-12 MED ORDER — LEVOFLOXACIN 500 MG PO TABS
500.0000 mg | ORAL_TABLET | ORAL | Status: DC
  Administered 2020-08-12: 500 mg via ORAL
  Filled 2020-08-12: qty 1

## 2020-08-12 MED ORDER — DIPHENOXYLATE-ATROPINE 2.5-0.025 MG/5ML PO LIQD
5.0000 mL | Freq: Four times a day (QID) | ORAL | Status: DC | PRN
  Administered 2020-08-12: 5 mL via ORAL
  Filled 2020-08-12 (×2): qty 5

## 2020-08-12 MED ORDER — POTASSIUM CHLORIDE 10 MEQ/100ML IV SOLN
10.0000 meq | INTRAVENOUS | Status: AC
  Administered 2020-08-12 (×2): 10 meq via INTRAVENOUS
  Filled 2020-08-12 (×2): qty 100

## 2020-08-12 MED ORDER — ACETAMINOPHEN 650 MG RE SUPP: 650.0000 mg | Freq: Four times a day (QID) | RECTAL | Status: DC | PRN

## 2020-08-12 MED ORDER — POTASSIUM CHLORIDE CRYS ER 10 MEQ PO TBCR
40.0000 meq | EXTENDED_RELEASE_TABLET | Freq: Two times a day (BID) | ORAL | Status: AC
  Administered 2020-08-12 (×2): 40 meq via ORAL
  Filled 2020-08-12: qty 4
  Filled 2020-08-12: qty 2

## 2020-08-12 MED ORDER — LOPERAMIDE HCL 2 MG PO CAPS
4.0000 mg | ORAL_CAPSULE | Freq: Once | ORAL | Status: AC
  Administered 2020-08-12: 4 mg via ORAL
  Filled 2020-08-12: qty 2

## 2020-08-12 MED ORDER — FINASTERIDE 5 MG PO TABS
5.0000 mg | ORAL_TABLET | Freq: Every day | ORAL | Status: DC
  Administered 2020-08-12 – 2020-08-15 (×4): 5 mg via ORAL
  Filled 2020-08-12 (×4): qty 1

## 2020-08-12 MED ORDER — SODIUM BICARBONATE 650 MG PO TABS
650.0000 mg | ORAL_TABLET | Freq: Three times a day (TID) | ORAL | Status: AC
  Administered 2020-08-12 (×3): 650 mg via ORAL
  Filled 2020-08-12 (×4): qty 1

## 2020-08-12 MED ORDER — HEPARIN SODIUM (PORCINE) 5000 UNIT/ML IJ SOLN
5000.0000 [IU] | Freq: Three times a day (TID) | INTRAMUSCULAR | Status: DC
  Administered 2020-08-12 – 2020-08-15 (×10): 5000 [IU] via SUBCUTANEOUS
  Filled 2020-08-12 (×10): qty 1

## 2020-08-12 MED ORDER — INSULIN ASPART 100 UNIT/ML IJ SOLN
0.0000 [IU] | Freq: Three times a day (TID) | INTRAMUSCULAR | Status: DC
  Administered 2020-08-12: 1 [IU] via SUBCUTANEOUS
  Administered 2020-08-13: 2 [IU] via SUBCUTANEOUS
  Administered 2020-08-14: 1 [IU] via SUBCUTANEOUS
  Filled 2020-08-12: qty 0.06

## 2020-08-12 MED ORDER — ACETAMINOPHEN 325 MG PO TABS: 650.0000 mg | ORAL_TABLET | Freq: Four times a day (QID) | ORAL | Status: DC | PRN

## 2020-08-12 MED ORDER — SODIUM CHLORIDE 0.9 % IV SOLN
8.0000 mg | Freq: Four times a day (QID) | INTRAVENOUS | Status: DC | PRN
  Filled 2020-08-12: qty 4

## 2020-08-12 MED ORDER — TAMSULOSIN HCL 0.4 MG PO CAPS
0.4000 mg | ORAL_CAPSULE | Freq: Two times a day (BID) | ORAL | Status: DC
  Administered 2020-08-12 – 2020-08-15 (×7): 0.4 mg via ORAL
  Filled 2020-08-12 (×7): qty 1

## 2020-08-12 MED ORDER — MAGNESIUM SULFATE 2 GM/50ML IV SOLN
2.0000 g | Freq: Once | INTRAVENOUS | Status: AC
  Administered 2020-08-12: 2 g via INTRAVENOUS
  Filled 2020-08-12: qty 50

## 2020-08-12 NOTE — ED Notes (Signed)
Updated patients great great granddaughter on plan of care at bedside.

## 2020-08-12 NOTE — ED Notes (Signed)
Pt had episode of diarrhea. Pt cleaned up and linen changed

## 2020-08-12 NOTE — ED Notes (Signed)
Patient called out for BM. Patient had small green diarrhea. Brief and chucked changed. Patient repositioned upright and given breakfast tray. Patient ate a few pieces of bananas, grits, eggs, and coffee with no issues at this time. Will continue to monitor.

## 2020-08-12 NOTE — ED Notes (Signed)
Pt brief changed. And lunch tray provided.

## 2020-08-12 NOTE — ED Notes (Signed)
Patient called out for BM. Patient had very small amount of bright green diarrhea. Brief changed and patient repositioned in bed. Redness noticed on sacrum, no wounds. Barrier cream placed on sacrum and sacrum pad placed. Patient comfortable and resting. Patient drinking water with no issues.

## 2020-08-12 NOTE — ED Notes (Addendum)
Updated patients daughter on plan of care. Via telephone.

## 2020-08-12 NOTE — H&P (Signed)
History and Physical    WLADIMIR AERNI [id number] DOB: 28-May-1933 DOA: 08/11/2020  PCP: Burnard Bunting, MD  Patient coming from: Home.  Chief Complaint: Abnormal labs.  HPI: Francisco [name] is a 87 y.o. male with history of pancreatoduodenectomy for pancreatic cyst, diabetes mellitus, BPH has been experiencing nausea vomiting diarrhea for the last couple of days.  Denies taking any antibiotics recently.  Patient had followed up with his PCP and labs are drawn which showed elevated creatinine and was advised to come to the ER.  ED Course: In the ER patient labs show creatinine of 4.1 that has increased from 0.8 in September 2021.  Potassium was 2.4.  Patient was started on IV fluids potassium replacement and admitted for further management of acute renal failure likely from nausea vomiting and diarrhea.  CT abdomen pelvis was unremarkable.  COVID test is negative.  Review of Systems: As per HPI, rest all negative.   Past Medical History:  Diagnosis Date   Asthma with allergic rhinitis    BPH (benign prostatic hyperplasia)    DM (diabetes mellitus) (HCC)    Elevated CEA    Elevated prostate specific antigen (PSA)    Fatty liver    GERD (gastroesophageal reflux disease)    Hx of adenomatous colonic polyps 02/14/2016   10 adenomas max 15 mm 02/2016 - no recall at his age   Hyperlipidemia    Impotence of organic origin    Neoplasm of uncertain behavior of prostate    Pancreatic cyst FOLLOWED BY DR Ardis Hughs   Personal history of urinary calculi    Renal calculi 09/19/2011   Right ureteral stone     Past Surgical History:  Procedure Laterality Date   CHOLECYSTECTOMY     COLONOSCOPY W/ POLYPECTOMY  2018   CYSTOSCOPY WITH RETROGRADE PYELOGRAM, URETEROSCOPY AND STENT PLACEMENT Right 08/19/2019   Procedure: CYSTOSCOPY WITH RETROGRADE PYELOGRAM, URETEROSCOPY, LASER LITHOTRIPSY STENT PLACEMENT FIRST STAGE;  Surgeon: Alexis Frock, MD;  Location: [facility];  Service: Urology;   Laterality: Right;  82 MINS   CYSTOSCOPY WITH RETROGRADE PYELOGRAM, URETEROSCOPY AND STENT PLACEMENT Right 09/04/2019   Procedure: CYSTOSCOPY WITH RETROGRADE PYELOGRAM, BASKETING OF STONES URETEROSCOPY AND STENT EXCHANGE;  Surgeon: Alexis Frock, MD;  Location: [facility];  Service: Urology;  Laterality: Right;  60 MINS   CYSTOSCOPY WITH URETEROSCOPY  03/14/2011   Procedure: CYSTOSCOPY WITH URETEROSCOPY;  Surgeon: Molli Hazard, MD;  Location: [facility];  Service: Urology;  Laterality: Right;  2 hours requested for this case  Right Ureter Stent Placement  C-ARM CAMERA DIGITAL URETEROSCOPE     EUS  04/12/2011   Procedure: UPPER ENDOSCOPIC ULTRASOUND (EUS) LINEAR;  Surgeon: Milus Banister, MD;  Location: [facility];  Service: Endoscopy;  Laterality: N/A;  radial linear/pt moved up a hour early by AW , Patty @ [place] office ok'd & pt was called by AW   HOLMIUM LASER APPLICATION Right [id number]   Procedure: HOLMIUM LASER APPLICATION;  Surgeon: Alexis Frock, MD;  Location: [facility];  Service: Urology;  Laterality: Right;   KIDNEY STONE SURGERY  March 2013   PERCUTANEOUS NEPHROSTOLITHOTOMY  1985   WHIPPLE PROCEDURE  12/2011   [facility]     reports that he quit smoking about 29 years ago. His smoking use included cigarettes. He has never used smokeless tobacco. He reports current alcohol use. He reports that he does not use drugs.  No Known Allergies  Family History  Problem Relation Age of Onset   Diabetes  Mother    Diabetes Sister    Cancer Brother        unknown type   Stroke Father     Prior to Admission medications   Medication Sig Start Date End Date Taking? Authorizing Provider  ACCU-CHEK FASTCLIX LANCETS MISC by Does not apply route daily.    [provider]  APPLE CIDER VINEGAR PO Take 1 capsule by mouth daily.     [provider]  cephALEXin (KEFLEX) 500 MG capsule Take 1 capsule (500 mg total) by mouth 2 (two) times daily. X 3 days to  prevent infection with tethered stent in place. Patient not taking: Reported on 09/22/2019 09/04/19   Alexis Frock, MD  cholestyramine Lucrezia Starch) 4 g packet Take 1 packet by mouth 2 (two) times daily. 09/14/19   [provider]  CINNAMON PO Take 2,000 mg by mouth daily.    [provider]  empagliflozin (JARDIANCE) 10 MG TABS tablet Take 5 mg by mouth daily.    [provider]  esomeprazole (NEXIUM) 40 MG capsule Take 40 mg by mouth 2 (two) times daily.    [provider]  finasteride (PROSCAR) 5 MG tablet Take 5 mg by mouth daily.    [provider]  ketorolac (TORADOL) 10 MG tablet Take 1 tablet (10 mg total) by mouth every 8 (eight) hours as needed for moderate pain. Or stent discomfort post-operatively Patient not taking: Reported on 09/22/2019 09/04/19   Alexis Frock, MD  Multiple Vitamin (MULTIVITAMIN) tablet Take 1 tablet by mouth daily.    [provider]  Probiotic Product (ALIGN) 4 MG CAPS Take 4 mg by mouth daily.     [provider]  tamsulosin (FLOMAX) 0.4 MG CAPS capsule Take 0.4 mg by mouth 2 (two) times daily.    [provider]  traMADol (ULTRAM) 50 MG tablet Take 1-2 tablets (50-100 mg total) by mouth every 6 (six) hours as needed for severe pain. Post-operatively Patient not taking: Reported on 09/22/2019 09/04/19   Alexis Frock, MD  triamcinolone cream (KENALOG) 0.1 % Apply 1 application topically 3 (three) times daily as needed (itching). Patient not taking: Reported on 09/22/2019    [provider]    Physical Exam: Constitutional: Moderately built and nourished. Vitals:   08/12/20 0030 08/12/20 0045 08/12/20 0100 08/12/20 0115  BP: 128/77 (!) 147/71 122/73 123/73  Pulse: 77 78 74 78  Resp: (!) '21 18 13 16  '$ Temp:      TempSrc:      SpO2: 97% 100% 99% 96%  Weight:      Height:       Eyes: Anicteric no pallor. ENMT: No discharge from the ears eyes nose or mouth. Neck: No mass felt.  No  neck rigidity. Respiratory: No rhonchi or crepitations. Cardiovascular: S1-S2 heard. Abdomen: Soft nontender bowel sound present. Musculoskeletal: No edema. Skin: No rash. Neurologic: Alert awake oriented to time place and person.  Moves all extremities. Psychiatric: Appears normal.  Normal affect.   Labs on Admission: I have personally reviewed following labs and imaging studies  CBC: Recent Labs  Lab 08/11/20 2240  WBC 5.2  NEUTROABS 4.7  HGB 16.2  HCT 48.9  MCV 88.6  PLT [id number]   Basic Metabolic Panel: Recent Labs  Lab 08/11/20 2233 08/12/20 0016  NA 130*  --   K 2.4*  --   CL 95*  --   CO2 15*  --   GLUCOSE 254*  --   BUN 57*  --  CREATININE 4.10*  --   CALCIUM 8.1*  --   MG  --  2.3   GFR: Estimated Creatinine Clearance: 10.1 mL/min (A) (by C-G formula based on SCr of 4.1 mg/dL (H)). Liver Function Tests: Recent Labs  Lab 08/11/20 2233  AST 18  ALT 21  ALKPHOS 105  BILITOT 0.7  PROT 6.6  ALBUMIN 3.0*   Recent Labs  Lab 08/11/20 2233  LIPASE 17   No results for input(s): AMMONIA in the last 168 hours. Coagulation Profile: No results for input(s): INR, PROTIME in the last 168 hours. Cardiac Enzymes: No results for input(s): CKTOTAL, CKMB, CKMBINDEX, TROPONINI in the last 168 hours. BNP (last 3 results) No results for input(s): PROBNP in the last 8760 hours. HbA1C: No results for input(s): HGBA1C in the last 72 hours. CBG: No results for input(s): GLUCAP in the last 168 hours. Lipid Profile: No results for input(s): CHOL, HDL, LDLCALC, TRIG, CHOLHDL, LDLDIRECT in the last 72 hours. Thyroid Function Tests: No results for input(s): TSH, T4TOTAL, FREET4, T3FREE, THYROIDAB in the last 72 hours. Anemia Panel: No results for input(s): VITAMINB12, FOLATE, FERRITIN, TIBC, IRON, RETICCTPCT in the last 72 hours. Urine analysis:    Component Value Date/Time   COLORURINE YELLOW 08/03/2018 1450   APPEARANCEUR CLEAR 08/03/2018 1450   LABSPEC 1.008  08/03/2018 1450   PHURINE 5.0 08/03/2018 1450   GLUCOSEU NEGATIVE 08/03/2018 1450   HGBUR SMALL (A) 08/03/2018 1450   BILIRUBINUR NEGATIVE 08/03/2018 1450   KETONESUR NEGATIVE 08/03/2018 1450   PROTEINUR NEGATIVE 08/03/2018 1450   UROBILINOGEN 0.2 05/14/2012 0030   NITRITE NEGATIVE 08/03/2018 1450   LEUKOCYTESUR NEGATIVE 08/03/2018 1450   Sepsis Labs: '@LABRCNTIP'$ (procalcitonin:4,lacticidven:4) ) Recent Results (from the past 240 hour(s))  Resp Panel by RT-PCR (Flu A&B, Covid) Nasopharyngeal Swab     Status: None   Collection Time: 08/11/20 10:39 PM   Specimen: Nasopharyngeal Swab; Nasopharyngeal(NP) swabs in vial transport medium  Result Value Ref Range Status   SARS Coronavirus 2 by RT PCR NEGATIVE NEGATIVE Final    Comment: (NOTE) SARS-CoV-2 target nucleic acids are NOT DETECTED.  The SARS-CoV-2 RNA is generally detectable in upper respiratory specimens during the acute phase of infection. The lowest concentration of SARS-CoV-2 viral copies this assay can detect is 138 copies/mL. A negative result does not preclude SARS-Cov-2 infection and should not be used as the sole basis for treatment or other patient management decisions. A negative result may occur with  improper specimen collection/handling, submission of specimen other than nasopharyngeal swab, presence of viral mutation(s) within the areas targeted by this assay, and inadequate number of viral copies(<138 copies/mL). A negative result must be combined with clinical observations, patient history, and epidemiological information. The expected result is Negative.  Fact Sheet for Patients:  EntrepreneurPulse.com.au  Fact Sheet for Healthcare Providers:  IncredibleEmployment.be  This test is no t yet approved or cleared by the [place] FDA and  has been authorized for detection and/or diagnosis of SARS-CoV-2 by FDA under an Emergency Use Authorization (EUA). This EUA will  remain  in effect (meaning this test can be used) for the duration of the COVID-19 declaration under Section 564(b)(1) of the Act, 21 U.S.C.section 360bbb-3(b)(1), unless the authorization is terminated  or revoked sooner.       Influenza A by PCR NEGATIVE NEGATIVE Final   Influenza B by PCR NEGATIVE NEGATIVE Final    Comment: (NOTE) The Xpert Xpress SARS-CoV-2/FLU/RSV plus assay is intended as an aid in the diagnosis of influenza from  Nasopharyngeal swab specimens and should not be used as a sole basis for treatment. Nasal washings and aspirates are unacceptable for Xpert Xpress SARS-CoV-2/FLU/RSV testing.  Fact Sheet for Patients: EntrepreneurPulse.com.au  Fact Sheet for Healthcare Providers: IncredibleEmployment.be  This test is not yet approved or cleared by the [place] FDA and has been authorized for detection and/or diagnosis of SARS-CoV-2 by FDA under an Emergency Use Authorization (EUA). This EUA will remain in effect (meaning this test can be used) for the duration of the COVID-19 declaration under Section 564(b)(1) of the Act, 21 U.S.C. section 360bbb-3(b)(1), unless the authorization is terminated or revoked.  Performed at [facility], [place] [address], [place], [place] [address]      Radiological Exams on Admission: CT ABDOMEN PELVIS WO CONTRAST  Result Date: 08/11/2020 CLINICAL DATA:  Nausea vomiting EXAM: CT ABDOMEN AND PELVIS WITHOUT CONTRAST TECHNIQUE: Multidetector CT imaging of the abdomen and pelvis was performed following the standard protocol without IV contrast. COMPARISON:  CT 05/14/2012, 08/12/2019 FINDINGS: Lower chest: Lung bases demonstrate no acute consolidation or effusion. Normal cardiac size. Hepatobiliary: Previously noted left hepatic lobe cyst not well seen today. Status post cholecystectomy. Lobulated liver contour. Pneumobilia present today, not seen on 2021 comparison but present on  2014 postoperative comparison. This is presumably related to choledochal jejunostomy. Pancreas: Status post partial pancreatectomy. No inflammatory changes. Spleen: Normal in size without focal abnormality. Adrenals/Urinary Tract: Adrenal glands are within normal limits. Cyst in the mid right kidney. No hydronephrosis. The bladder is nearly empty and slightly thick-walled. Stomach/Bowel: Distal gastrectomy. No dilated small or large bowel. No acute bowel wall thickening. Negative appendix Vascular/Lymphatic: Advanced aortic atherosclerosis. No aneurysm. No suspicious nodes Reproductive: Massively enlarged prostate with mass effect on the bladder Other: Negative for pelvic effusion or free air Musculoskeletal: Chronic compression deformity at L1. No acute or suspicious osseous abnormality. Chronic sclerosis in the right iliac bone. IMPRESSION: 1. Negative for bowel obstruction or bowel wall thickening. 2. Patient is status post partial pancreatectomy and distal gastrectomy. Interim finding of pneumobilia presumably related to choledochal jejunostomy, some pneumobilia evident on prior CT from 2014. 3. Massively enlarged prostate with mass effect on the bladder. Bladder is slightly thick walled Electronically Signed   By: Donavan Foil M.D.   On: 08/11/2020 23:30   DG Chest 2 View  Result Date: 08/11/2020 CLINICAL DATA:  Cough EXAM: CHEST - 2 VIEW COMPARISON:  None. FINDINGS: The heart size and mediastinal contours are within normal limits. Both lungs are clear. The visualized skeletal structures are unremarkable. IMPRESSION: No active cardiopulmonary disease. Electronically Signed   By: Rolm Baptise M.D.   On: 08/11/2020 22:53      Assessment/Plan Active Problems:   Benign prostatic hyperplasia   Diabetes mellitus type 2, uncomplicated (HCC)   ARF (acute renal failure) (HCC)   Nausea vomiting and diarrhea    Acute renal failure likely from nausea vomiting and diarrhea.  Patient did have multiple  episodes of diarrhea in the ER also.  We will gently hydrate and closely follow intake of metabolic panel. Nausea vomiting diarrhea could be from gastroenteritis.  Stool studies are pending.  CT abdomen pelvis unremarkable. Hypokalemia from diarrhea replace recheck.  Check magnesium. History of diabetes mellitus type 2 presently with hyperglycemia on sliding scale coverage.  Closely monitor CBGs. History of pancreatoduodenectomy for pancreatic cyst. History of BPH we will continue home medications.  Home medications needs to be verified.   DVT prophylaxis: Heparin. Code Status: Full code. Family Communication:  Discussed with patient. Disposition Plan: Home. Consults called: None. Admission status: Observation.   Rise Patience MD [facility] Pager [phone].  If 7PM-7AM, please contact night-coverage [email or web address] Password [facility]  08/12/2020, 1:44 AM

## 2020-08-12 NOTE — ED Notes (Signed)
Patient had large bright neon green diarrhea. Patient cleaned and changed.

## 2020-08-12 NOTE — ED Notes (Signed)
Breakfast tray provided. 

## 2020-08-12 NOTE — ED Notes (Signed)
Pt had BM, pt stool sample sent to lab and pt cleaned and new brief placed

## 2020-08-12 NOTE — Progress Notes (Signed)
[facility] PROGRESS NOTE    Progress Note  Francisco [name]  [id number] DOB: Nov 22, 1933 DOA: 08/11/2020 PCP: Burnard Bunting, MD     Brief Narrative:   Francisco [name] is an 87 y.o. male history of pancreatic duodenectomy for pancreatic cyst, diabetes mellitus and BPH comes in for nausea vomiting diarrhea for couple of days.  In the ED was found to have a creatinine of 4 (baseline creatinine less than 1), potassium of 2.4 CT scan of the abdomen pelvis was unremarkable SARS-CoV-2 PCR was negative.   Assessment/Plan:   Acute kidney injury: Likely prerenal in the setting of nausea vomiting and diarrhea and ketorolac use start him on aggressive IV fluid hydration. Is slowly improving, this morning is 3.2. He relates his nausea is resolved, he would like to try to eat.  Non-anion gap metabolic acidosis: Likely due to diarrhea start him on oral bicarbonate. Nausea vomiting and diarrhea: Likely due to gastroenteritis stool studies pending CT scan of the abdomen pelvis unremarkable.  Hypokalemia: Replete orally recheck this afternoon. Magnesium level 2.3.  -Hypovolemic hyponatremia: Continue IV fluids sodium is improving nicely.  Diabetes mellitus type 2 with hyperglycemia: Continue sliding scale insulin, blood glucose improving without any insulin came down to from  254-1 66.  Benign prostatic hyperplasia Continue current home medications.   DVT prophylaxis: lovenox Family Communication:none Status is: Observation  The patient will require care spanning > 2 midnights and should be moved to inpatient because: Hemodynamically unstable  Dispo: The patient is from: Home              Anticipated d/c is to: Home              Patient currently is not medically stable to d/c.   Difficult to place patient No        Code Status:     Code Status Orders  (From admission, onward)           Start     Ordered   08/12/20 0144  Full code  Continuous         08/12/20 0144           Code Status History     This patient has a current code status but no historical code status.         IV Access:   Peripheral IV   Procedures and diagnostic studies:   CT ABDOMEN PELVIS WO CONTRAST  Result Date: 08/11/2020 CLINICAL DATA:  Nausea vomiting EXAM: CT ABDOMEN AND PELVIS WITHOUT CONTRAST TECHNIQUE: Multidetector CT imaging of the abdomen and pelvis was performed following the standard protocol without IV contrast. COMPARISON:  CT 05/14/2012, 08/12/2019 FINDINGS: Lower chest: Lung bases demonstrate no acute consolidation or effusion. Normal cardiac size. Hepatobiliary: Previously noted left hepatic lobe cyst not well seen today. Status post cholecystectomy. Lobulated liver contour. Pneumobilia present today, not seen on 2021 comparison but present on 2014 postoperative comparison. This is presumably related to choledochal jejunostomy. Pancreas: Status post partial pancreatectomy. No inflammatory changes. Spleen: Normal in size without focal abnormality. Adrenals/Urinary Tract: Adrenal glands are within normal limits. Cyst in the mid right kidney. No hydronephrosis. The bladder is nearly empty and slightly thick-walled. Stomach/Bowel: Distal gastrectomy. No dilated small or large bowel. No acute bowel wall thickening. Negative appendix Vascular/Lymphatic: Advanced aortic atherosclerosis. No aneurysm. No suspicious nodes Reproductive: Massively enlarged prostate with mass effect on the bladder Other: Negative for pelvic effusion or free air Musculoskeletal: Chronic compression deformity at L1. No acute or suspicious  osseous abnormality. Chronic sclerosis in the right iliac bone. IMPRESSION: 1. Negative for bowel obstruction or bowel wall thickening. 2. Patient is status post partial pancreatectomy and distal gastrectomy. Interim finding of pneumobilia presumably related to choledochal jejunostomy, some pneumobilia evident on prior CT from 2014. 3. Massively  enlarged prostate with mass effect on the bladder. Bladder is slightly thick walled Electronically Signed   By: Donavan Foil M.D.   On: 08/11/2020 23:30   DG Chest 2 View  Result Date: 08/11/2020 CLINICAL DATA:  Cough EXAM: CHEST - 2 VIEW COMPARISON:  None. FINDINGS: The heart size and mediastinal contours are within normal limits. Both lungs are clear. The visualized skeletal structures are unremarkable. IMPRESSION: No active cardiopulmonary disease. Electronically Signed   By: Rolm Baptise M.D.   On: 08/11/2020 22:53     Medical Consultants:   None.   Subjective:    Francisco [name] denies any abdominal pain has not had any further nausea.  Objective:    Vitals:   08/12/20 0300 08/12/20 0400 08/12/20 0500 08/12/20 0600  BP: 124/61 108/69 133/70 137/67  Pulse: 84 67 75 73  Resp: '17 13 15 15  '$ Temp:      TempSrc:      SpO2: 95% 97% 96% 98%  Weight:      Height:       SpO2: 98 %   Intake/Output Summary (Last 24 hours) at 08/12/2020 0706 Last data filed at 08/12/2020 0232 Gross per 24 hour  Intake 1489.41 ml  Output --  Net 1489.41 ml   Filed Weights   08/11/20 2152  Weight: 53.1 kg    Exam: General exam: In no acute distress. Respiratory system: Good air movement and clear to auscultation. Cardiovascular system: S1 & S2 heard, RRR. No JVD. Gastrointestinal system: Abdomen is nondistended, soft and nontender.  Extremities: No pedal edema. Skin: No rashes, lesions or ulcers Psychiatry: Judgement and insight appear normal. Mood & affect appropriate.    Data Reviewed:    Labs: Basic Metabolic Panel: Recent Labs  Lab 08/11/20 2233 08/12/20 0016 08/12/20 0502  NA 130*  --  132*  K 2.4*  --  2.5*  CL 95*  --  103  CO2 15*  --  14*  GLUCOSE 254*  --  166*  BUN 57*  --  55*  CREATININE 4.10*  --  3.20*  CALCIUM 8.1*  --  7.9*  MG  --  2.3  --    GFR Estimated Creatinine Clearance: 12.9 mL/min (A) (by C-G formula based on SCr of 3.2 mg/dL (H)). Liver  Function Tests: Recent Labs  Lab 08/11/20 2233 08/12/20 0502  AST 18 18  ALT 21 19  ALKPHOS 105 90  BILITOT 0.7 0.7  PROT 6.6 6.1*  ALBUMIN 3.0* 2.7*   Recent Labs  Lab 08/11/20 2233  LIPASE 17   No results for input(s): AMMONIA in the last 168 hours. Coagulation profile No results for input(s): INR, PROTIME in the last 168 hours. COVID-19 Labs  No results for input(s): DDIMER, FERRITIN, LDH, CRP in the last 72 hours.  Lab Results  Component Value Date   SARSCOV2NAA NEGATIVE 08/11/2020   SARSCOV2NAA NEGATIVE 09/02/2019   [place] NEGATIVE 08/18/2019    CBC: Recent Labs  Lab 08/11/20 2240 08/12/20 0502  WBC 5.2 5.0  NEUTROABS 4.7  --   HGB 16.2 14.8  HCT 48.9 43.9  MCV 88.6 88.3  PLT 241 227   Cardiac Enzymes: No results for input(s): CKTOTAL, CKMB, CKMBINDEX,  TROPONINI in the last 168 hours. BNP (last 3 results) No results for input(s): PROBNP in the last 8760 hours. CBG: No results for input(s): GLUCAP in the last 168 hours. D-Dimer: No results for input(s): DDIMER in the last 72 hours. Hgb A1c: No results for input(s): HGBA1C in the last 72 hours. Lipid Profile: No results for input(s): CHOL, HDL, LDLCALC, TRIG, CHOLHDL, LDLDIRECT in the last 72 hours. Thyroid function studies: No results for input(s): TSH, T4TOTAL, T3FREE, THYROIDAB in the last 72 hours.  Invalid input(s): FREET3 Anemia work up: No results for input(s): VITAMINB12, FOLATE, FERRITIN, TIBC, IRON, RETICCTPCT in the last 72 hours. Sepsis Labs: Recent Labs  Lab 08/11/20 2240 08/12/20 0502  WBC 5.2 5.0   Microbiology Recent Results (from the past 240 hour(s))  Resp Panel by RT-PCR (Flu A&B, Covid) Nasopharyngeal Swab     Status: None   Collection Time: 08/11/20 10:39 PM   Specimen: Nasopharyngeal Swab; Nasopharyngeal(NP) swabs in vial transport medium  Result Value Ref Range Status   SARS Coronavirus 2 by RT PCR NEGATIVE NEGATIVE Final    Comment: (NOTE) SARS-CoV-2 target  nucleic acids are NOT DETECTED.  The SARS-CoV-2 RNA is generally detectable in upper respiratory specimens during the acute phase of infection. The lowest concentration of SARS-CoV-2 viral copies this assay can detect is 138 copies/mL. A negative result does not preclude SARS-Cov-2 infection and should not be used as the sole basis for treatment or other patient management decisions. A negative result may occur with  improper specimen collection/handling, submission of specimen other than nasopharyngeal swab, presence of viral mutation(s) within the areas targeted by this assay, and inadequate number of viral copies(<138 copies/mL). A negative result must be combined with clinical observations, patient history, and epidemiological information. The expected result is Negative.  Fact Sheet for Patients:  EntrepreneurPulse.com.au  Fact Sheet for Healthcare Providers:  IncredibleEmployment.be  This test is no t yet approved or cleared by the [place] FDA and  has been authorized for detection and/or diagnosis of SARS-CoV-2 by FDA under an Emergency Use Authorization (EUA). This EUA will remain  in effect (meaning this test can be used) for the duration of the COVID-19 declaration under Section 564(b)(1) of the Act, 21 U.S.C.section 360bbb-3(b)(1), unless the authorization is terminated  or revoked sooner.       Influenza A by PCR NEGATIVE NEGATIVE Final   Influenza B by PCR NEGATIVE NEGATIVE Final    Comment: (NOTE) The Xpert Xpress SARS-CoV-2/FLU/RSV plus assay is intended as an aid in the diagnosis of influenza from Nasopharyngeal swab specimens and should not be used as a sole basis for treatment. Nasal washings and aspirates are unacceptable for Xpert Xpress SARS-CoV-2/FLU/RSV testing.  Fact Sheet for Patients: EntrepreneurPulse.com.au  Fact Sheet for Healthcare  Providers: IncredibleEmployment.be  This test is not yet approved or cleared by the [place] FDA and has been authorized for detection and/or diagnosis of SARS-CoV-2 by FDA under an Emergency Use Authorization (EUA). This EUA will remain in effect (meaning this test can be used) for the duration of the COVID-19 declaration under Section 564(b)(1) of the Act, 21 U.S.C. section 360bbb-3(b)(1), unless the authorization is terminated or revoked.  Performed at [facility], [place] [address] [place], [place] [address]   C Difficile Quick Screen w PCR reflex     Status: None   Collection Time: 08/12/20  1:30 AM   Specimen: Stool  Result Value Ref Range Status   C Diff antigen NEGATIVE NEGATIVE Final  C Diff toxin NEGATIVE NEGATIVE Final   C Diff interpretation No C. difficile detected.  Final    Comment: Performed at [facility], [place] [address] [place], [place] [address]     Medications:    finasteride  5 mg Oral Daily   heparin  5,000 Units Subcutaneous Q8H   insulin aspart  0-6 Units Subcutaneous TID WC   tamsulosin  0.4 mg Oral BID   Continuous Infusions:  sodium chloride 125 mL/hr at 08/12/20 0208      LOS: 0 days   Charlynne Cousins  [facility]  08/12/2020, 7:06 AM

## 2020-08-13 LAB — BASIC METABOLIC PANEL
Anion gap: 9 (ref 5–15)
Anion gap: 7 (ref 5–15)
BUN: 50 mg/dL — ABNORMAL HIGH (ref 8–23)
BUN: 41 mg/dL — ABNORMAL HIGH (ref 8–23)
CO2: 15 mmol/L — ABNORMAL LOW (ref 22–32)
CO2: 16 mmol/L — ABNORMAL LOW (ref 22–32)
Calcium: 8.3 mg/dL — ABNORMAL LOW (ref 8.9–10.3)
Calcium: 8.4 mg/dL — ABNORMAL LOW (ref 8.9–10.3)
Chloride: 108 mmol/L (ref 98–111)
Chloride: 109 mmol/L (ref 98–111)
Creatinine, Ser: 1.81 mg/dL — ABNORMAL HIGH (ref 0.61–1.24)
Creatinine, Ser: 1.48 mg/dL — ABNORMAL HIGH (ref 0.61–1.24)
GFR, Estimated: 36 mL/min — ABNORMAL LOW (ref >60)
GFR, Estimated: 46 mL/min — ABNORMAL LOW (ref >60)
Glucose, Bld: 102 mg/dL — ABNORMAL HIGH (ref 70–99)
Glucose, Bld: 203 mg/dL — ABNORMAL HIGH (ref 70–99)
Potassium: 2.6 mmol/L — CL (ref 3.5–5.1)
Potassium: 2.9 mmol/L — ABNORMAL LOW (ref 3.5–5.1)
Sodium: 132 mmol/L — ABNORMAL LOW (ref 135–145)
Sodium: 132 mmol/L — ABNORMAL LOW (ref 135–145)

## 2020-08-13 LAB — GLUCOSE, CAPILLARY
Glucose-Capillary: 101 mg/dL — ABNORMAL HIGH (ref 70–99)
Glucose-Capillary: 153 mg/dL — ABNORMAL HIGH (ref 70–99)
Glucose-Capillary: 221 mg/dL — ABNORMAL HIGH (ref 70–99)
Glucose-Capillary: 110 mg/dL — ABNORMAL HIGH (ref 70–99)

## 2020-08-13 MED ORDER — LEVOFLOXACIN 250 MG PO TABS
250.0000 mg | ORAL_TABLET | Freq: Every day | ORAL | Status: DC
  Administered 2020-08-13 – 2020-08-14 (×2): 250 mg via ORAL
  Filled 2020-08-13 (×2): qty 1

## 2020-08-13 MED ORDER — PSYLLIUM 95 % PO PACK
1.0000 | PACK | Freq: Every day | ORAL | Status: DC
  Administered 2020-08-13 – 2020-08-14 (×2): 1 via ORAL
  Filled 2020-08-13 (×3): qty 1

## 2020-08-13 MED ORDER — POTASSIUM CHLORIDE CRYS ER 10 MEQ PO TBCR
40.0000 meq | EXTENDED_RELEASE_TABLET | Freq: Two times a day (BID) | ORAL | Status: DC
  Filled 2020-08-13: qty 4

## 2020-08-13 MED ORDER — POTASSIUM CHLORIDE CRYS ER 10 MEQ PO TBCR: 40.0000 meq | EXTENDED_RELEASE_TABLET | Freq: Two times a day (BID) | ORAL | Status: DC

## 2020-08-13 MED ORDER — POTASSIUM CHLORIDE CRYS ER 10 MEQ PO TBCR
40.0000 meq | EXTENDED_RELEASE_TABLET | Freq: Three times a day (TID) | ORAL | Status: AC
  Administered 2020-08-13 (×3): 40 meq via ORAL
  Filled 2020-08-13 (×3): qty 4

## 2020-08-13 MED ORDER — SODIUM CHLORIDE 0.9 % IV SOLN: INTRAVENOUS | Status: AC

## 2020-08-13 NOTE — Progress Notes (Signed)
[facility] PROGRESS NOTE    Progress Note  Francisco [name]  [id number] DOB: 1933/11/15 DOA: 08/11/2020 PCP: Burnard Bunting, MD     Brief Narrative:   Francisco [name] is an 87 y.o. male history of pancreatic duodenectomy for pancreatic cyst, diabetes mellitus and BPH comes in for nausea vomiting diarrhea for couple of days.  In the ED was found to have a creatinine of 4 (baseline creatinine less than 1), potassium of 2.4 CT scan of the abdomen pelvis was unremarkable SARS-CoV-2 PCR was negative.   Assessment/Plan:   Acute kidney injury: Likely prerenal in the setting of nausea vomiting and diarrhea and ketorolac. Started on IV fluids his creatinine is slowly improving. Tolerating her diet.  Salmonella diarrhea: Still having multiple bowel movements watery you started on oral Levaquin on 03/13/2020. Continue IV fluids for an additional 24 hours start on Metamucil. Restrict dairy in diet.  Non-anion gap metabolic acidosis: Likely due to diarrhea start him on oral bicarbonate. Nausea vomiting and diarrhea: Likely due to gastroenteritis stool studies pending CT scan of the abdomen pelvis unremarkable.  Hypokalemia: Still low, replete orally, recheck in am  -Hypovolemic hyponatremia: Continue IV fluids sodium is improving nicely.  Diabetes mellitus type 2 with hyperglycemia: Continue sliding scale insulin, blood glucose improving without any insulin came down to from  254-1 66.  Benign prostatic hyperplasia Continue current home medications.   DVT prophylaxis: lovenox Family Communication:none Status is: Observation  The patient will require care spanning > 2 midnights and should be moved to inpatient because: Hemodynamically unstable  Dispo: The patient is from: Home              Anticipated d/c is to: Home              Patient currently is not medically stable to d/c.   Difficult to place patient No        Code Status:     Code Status Orders   (From admission, onward)           Start     Ordered   08/12/20 0144  Full code  Continuous        08/12/20 0144           Code Status History     This patient has a current code status but no historical code status.         IV Access:   Peripheral IV   Procedures and diagnostic studies:   CT ABDOMEN PELVIS WO CONTRAST  Result Date: 08/11/2020 CLINICAL DATA:  Nausea vomiting EXAM: CT ABDOMEN AND PELVIS WITHOUT CONTRAST TECHNIQUE: Multidetector CT imaging of the abdomen and pelvis was performed following the standard protocol without IV contrast. COMPARISON:  CT 05/14/2012, 08/12/2019 FINDINGS: Lower chest: Lung bases demonstrate no acute consolidation or effusion. Normal cardiac size. Hepatobiliary: Previously noted left hepatic lobe cyst not well seen today. Status post cholecystectomy. Lobulated liver contour. Pneumobilia present today, not seen on 2021 comparison but present on 2014 postoperative comparison. This is presumably related to choledochal jejunostomy. Pancreas: Status post partial pancreatectomy. No inflammatory changes. Spleen: Normal in size without focal abnormality. Adrenals/Urinary Tract: Adrenal glands are within normal limits. Cyst in the mid right kidney. No hydronephrosis. The bladder is nearly empty and slightly thick-walled. Stomach/Bowel: Distal gastrectomy. No dilated small or large bowel. No acute bowel wall thickening. Negative appendix Vascular/Lymphatic: Advanced aortic atherosclerosis. No aneurysm. No suspicious nodes Reproductive: Massively enlarged prostate with mass effect on the bladder Other: Negative for pelvic  effusion or free air Musculoskeletal: Chronic compression deformity at L1. No acute or suspicious osseous abnormality. Chronic sclerosis in the right iliac bone. IMPRESSION: 1. Negative for bowel obstruction or bowel wall thickening. 2. Patient is status post partial pancreatectomy and distal gastrectomy. Interim finding of pneumobilia  presumably related to choledochal jejunostomy, some pneumobilia evident on prior CT from 2014. 3. Massively enlarged prostate with mass effect on the bladder. Bladder is slightly thick walled Electronically Signed   By: Donavan Foil M.D.   On: 08/11/2020 23:30   DG Chest 2 View  Result Date: 08/11/2020 CLINICAL DATA:  Cough EXAM: CHEST - 2 VIEW COMPARISON:  None. FINDINGS: The heart size and mediastinal contours are within normal limits. Both lungs are clear. The visualized skeletal structures are unremarkable. IMPRESSION: No active cardiopulmonary disease. Electronically Signed   By: Rolm Baptise M.D.   On: 08/11/2020 22:53     Medical Consultants:   None.   Subjective:    Francisco [name] nausea and vomiting improved diarrhea still watery more than 6 bowel movements a day.  Abdominal pain has resolved  Objective:    Vitals:   08/12/20 1453 08/12/20 1527 08/12/20 2022 08/13/20 0407  BP: 113/70 123/64 108/69 119/65  Pulse: 77 71 88 79  Resp: '15 15 16 16  '$ Temp: 97.6 F (36.4 C) 98 F (36.7 C) 97.8 F (36.6 C) 98.1 F (36.7 C)  TempSrc: Oral Oral Oral   SpO2: 97% 100% 98% 98%  Weight:      Height:       SpO2: 98 %   Intake/Output Summary (Last 24 hours) at 08/13/2020 0921 Last data filed at 08/13/2020 0300 Gross per 24 hour  Intake 1831.18 ml  Output --  Net 1831.18 ml    Filed Weights   08/11/20 2152  Weight: 53.1 kg    Exam: General exam: In no acute distress. Respiratory system: Good air movement and clear to auscultation. Cardiovascular system: S1 & S2 heard, RRR. No JVD. Gastrointestinal system: Abdomen is nondistended, soft and nontender.  Extremities: No pedal edema. Skin: No rashes, lesions or ulcers  Data Reviewed:    Labs: Basic Metabolic Panel: Recent Labs  Lab 08/11/20 2233 08/12/20 0016 08/12/20 0502 08/13/20 0644  NA 130*  --  132* 132*  K 2.4*  --  2.5* 2.6*  CL 95*  --  103 108  CO2 15*  --  14* 15*  GLUCOSE 254*  --  166* 102*   BUN 57*  --  55* 50*  CREATININE 4.10*  --  3.20* 1.81*  CALCIUM 8.1*  --  7.9* 8.3*  MG  --  2.3  --   --     GFR Estimated Creatinine Clearance: 22.8 mL/min (A) (by C-G formula based on SCr of 1.81 mg/dL (H)). Liver Function Tests: Recent Labs  Lab 08/11/20 2233 08/12/20 0502  AST 18 18  ALT 21 19  ALKPHOS 105 90  BILITOT 0.7 0.7  PROT 6.6 6.1*  ALBUMIN 3.0* 2.7*    Recent Labs  Lab 08/11/20 2233  LIPASE 17    No results for input(s): AMMONIA in the last 168 hours. Coagulation profile No results for input(s): INR, PROTIME in the last 168 hours. COVID-19 Labs  No results for input(s): DDIMER, FERRITIN, LDH, CRP in the last 72 hours.  Lab Results  Component Value Date   SARSCOV2NAA NEGATIVE 08/11/2020   SARSCOV2NAA NEGATIVE 09/02/2019   [place] NEGATIVE 08/18/2019    CBC: Recent Labs  Lab 08/11/20  2240 08/12/20 0502  WBC 5.2 5.0  NEUTROABS 4.7  --   HGB 16.2 14.8  HCT 48.9 43.9  MCV 88.6 88.3  PLT 241 227    Cardiac Enzymes: No results for input(s): CKTOTAL, CKMB, CKMBINDEX, TROPONINI in the last 168 hours. BNP (last 3 results) No results for input(s): PROBNP in the last 8760 hours. CBG: Recent Labs  Lab 08/12/20 0749 08/12/20 1146 08/12/20 1702 08/12/20 2029 08/13/20 0734  GLUCAP 152* 132* 199* 224* 101*   D-Dimer: No results for input(s): DDIMER in the last 72 hours. Hgb A1c: No results for input(s): HGBA1C in the last 72 hours. Lipid Profile: No results for input(s): CHOL, HDL, LDLCALC, TRIG, CHOLHDL, LDLDIRECT in the last 72 hours. Thyroid function studies: No results for input(s): TSH, T4TOTAL, T3FREE, THYROIDAB in the last 72 hours.  Invalid input(s): FREET3 Anemia work up: No results for input(s): VITAMINB12, FOLATE, FERRITIN, TIBC, IRON, RETICCTPCT in the last 72 hours. Sepsis Labs: Recent Labs  Lab 08/11/20 2240 08/12/20 0502  WBC 5.2 5.0    Microbiology Recent Results (from the past 240 hour(s))  Resp Panel by  RT-PCR (Flu A&B, Covid) Nasopharyngeal Swab     Status: None   Collection Time: 08/11/20 10:39 PM   Specimen: Nasopharyngeal Swab; Nasopharyngeal(NP) swabs in vial transport medium  Result Value Ref Range Status   SARS Coronavirus 2 by RT PCR NEGATIVE NEGATIVE Final    Comment: (NOTE) SARS-CoV-2 target nucleic acids are NOT DETECTED.  The SARS-CoV-2 RNA is generally detectable in upper respiratory specimens during the acute phase of infection. The lowest concentration of SARS-CoV-2 viral copies this assay can detect is 138 copies/mL. A negative result does not preclude SARS-Cov-2 infection and should not be used as the sole basis for treatment or other patient management decisions. A negative result may occur with  improper specimen collection/handling, submission of specimen other than nasopharyngeal swab, presence of viral mutation(s) within the areas targeted by this assay, and inadequate number of viral copies(<138 copies/mL). A negative result must be combined with clinical observations, patient history, and epidemiological information. The expected result is Negative.  Fact Sheet for Patients:  EntrepreneurPulse.com.au  Fact Sheet for Healthcare Providers:  IncredibleEmployment.be  This test is no t yet approved or cleared by the [place] FDA and  has been authorized for detection and/or diagnosis of SARS-CoV-2 by FDA under an Emergency Use Authorization (EUA). This EUA will remain  in effect (meaning this test can be used) for the duration of the COVID-19 declaration under Section 564(b)(1) of the Act, 21 U.S.C.section 360bbb-3(b)(1), unless the authorization is terminated  or revoked sooner.       Influenza A by PCR NEGATIVE NEGATIVE Final   Influenza B by PCR NEGATIVE NEGATIVE Final    Comment: (NOTE) The Xpert Xpress SARS-CoV-2/FLU/RSV plus assay is intended as an aid in the diagnosis of influenza from Nasopharyngeal swab  specimens and should not be used as a sole basis for treatment. Nasal washings and aspirates are unacceptable for Xpert Xpress SARS-CoV-2/FLU/RSV testing.  Fact Sheet for Patients: EntrepreneurPulse.com.au  Fact Sheet for Healthcare Providers: IncredibleEmployment.be  This test is not yet approved or cleared by the [place] FDA and has been authorized for detection and/or diagnosis of SARS-CoV-2 by FDA under an Emergency Use Authorization (EUA). This EUA will remain in effect (meaning this test can be used) for the duration of the COVID-19 declaration under Section 564(b)(1) of the Act, 21 U.S.C. section 360bbb-3(b)(1), unless the authorization is terminated or revoked.  Performed at [facility], [place] [address] [place], [place] [address]   Gastrointestinal Panel by PCR , Stool     Status: Abnormal   Collection Time: 08/12/20  1:30 AM   Specimen: Stool  Result Value Ref Range Status   Campylobacter species NOT DETECTED NOT DETECTED Final   Plesimonas shigelloides NOT DETECTED NOT DETECTED Final   Salmonella species DETECTED (A) NOT DETECTED Final    Comment: RESULT CALLED TO, READ BACK BY AND VERIFIED WITH: DR Venetia Constable ON 08/12/20 AT 1446 QSD    Yersinia enterocolitica NOT DETECTED NOT DETECTED Final   Vibrio species NOT DETECTED NOT DETECTED Final   Vibrio cholerae NOT DETECTED NOT DETECTED Final   Enteroaggregative E coli (EAEC) NOT DETECTED NOT DETECTED Final   Enteropathogenic E coli (EPEC) NOT DETECTED NOT DETECTED Final   Enterotoxigenic E coli (ETEC) NOT DETECTED NOT DETECTED Final   Shiga like toxin producing E coli (STEC) NOT DETECTED NOT DETECTED Final   Shigella/Enteroinvasive E coli (EIEC) NOT DETECTED NOT DETECTED Final   Cryptosporidium NOT DETECTED NOT DETECTED Final   Cyclospora cayetanensis NOT DETECTED NOT DETECTED Final   Entamoeba histolytica NOT DETECTED NOT DETECTED Final   Giardia lamblia NOT  DETECTED NOT DETECTED Final   Adenovirus F40/41 NOT DETECTED NOT DETECTED Final   Astrovirus NOT DETECTED NOT DETECTED Final   Norovirus GI/GII NOT DETECTED NOT DETECTED Final   Rotavirus A NOT DETECTED NOT DETECTED Final   Sapovirus (I, II, IV, and V) NOT DETECTED NOT DETECTED Final    Comment: Performed at [facility], [place] [place], [place] [address]  C Difficile Quick Screen w PCR reflex     Status: None   Collection Time: 08/12/20  1:30 AM   Specimen: Stool  Result Value Ref Range Status   C Diff antigen NEGATIVE NEGATIVE Final   C Diff toxin NEGATIVE NEGATIVE Final   C Diff interpretation No C. difficile detected.  Final    Comment: Performed at [facility], [place] [address] [place], [place] [address]     Medications:    finasteride  5 mg Oral Daily   heparin  5,000 Units Subcutaneous Q8H   insulin aspart  0-6 Units Subcutaneous TID WC   levofloxacin  500 mg Oral QODAY   potassium chloride  40 mEq Oral TID   tamsulosin  0.4 mg Oral BID   Continuous Infusions:  sodium chloride 100 mL/hr at 08/13/20 0540   ondansetron (ZOFRAN) IV        LOS: 1 day   Charlynne Cousins  [facility]  08/13/2020, 9:21 AM

## 2020-08-13 NOTE — Progress Notes (Signed)
PHARMACY NOTE:  ANTIMICROBIAL RENAL DOSAGE ADJUSTMENT  Current antimicrobial regimen includes a mismatch between antimicrobial dosage and estimated renal function.  As per policy approved by the Pharmacy & Therapeutics and Medical Executive Committees, the antimicrobial dosage will be adjusted accordingly.  Current antimicrobial dosage:  Levofloxacin 500 mg PO every other day  Indication: Salmonella GI infection/diarrhea  Renal Function:  Estimated Creatinine Clearance: 22.8 mL/min (A) (by C-G formula based on SCr of 1.81 mg/dL (H)).    Antimicrobial dosage has been changed to:  Levofloxacin 250 mg PO every 24 hours  Thank you for allowing pharmacy to be a part of this patient's care.  Lenis Noon, [facility] 08/13/2020 12:52 PM

## 2020-08-13 NOTE — Progress Notes (Signed)
Have alerted MD of K+ 2.6 awaiting response.

## 2020-08-13 NOTE — Progress Notes (Signed)
Assisted pt to bathroom, encouraged bedside commode due to pt stating he cant make it to the bathroom in time c/o diarrhea   due to DX Place bedside commode next to bed despite pt stating he "absolutely will not use it".

## 2020-08-14 LAB — BASIC METABOLIC PANEL
Anion gap: 6 (ref 5–15)
BUN: 33 mg/dL — ABNORMAL HIGH (ref 8–23)
CO2: 16 mmol/L — ABNORMAL LOW (ref 22–32)
Calcium: 8.6 mg/dL — ABNORMAL LOW (ref 8.9–10.3)
Chloride: 116 mmol/L — ABNORMAL HIGH (ref 98–111)
Creatinine, Ser: 1.15 mg/dL (ref 0.61–1.24)
GFR, Estimated: >60 mL/min (ref >60)
Glucose, Bld: 97 mg/dL (ref 70–99)
Potassium: 3.6 mmol/L (ref 3.5–5.1)
Sodium: 138 mmol/L (ref 135–145)

## 2020-08-14 LAB — GLUCOSE, CAPILLARY
Glucose-Capillary: 99 mg/dL (ref 70–99)
Glucose-Capillary: 138 mg/dL — ABNORMAL HIGH (ref 70–99)
Glucose-Capillary: 160 mg/dL — ABNORMAL HIGH (ref 70–99)
Glucose-Capillary: 107 mg/dL — ABNORMAL HIGH (ref 70–99)

## 2020-08-14 MED ORDER — POTASSIUM CHLORIDE CRYS ER 10 MEQ PO TBCR
40.0000 meq | EXTENDED_RELEASE_TABLET | Freq: Two times a day (BID) | ORAL | Status: AC
  Administered 2020-08-14 (×2): 40 meq via ORAL
  Filled 2020-08-14 (×2): qty 4

## 2020-08-14 NOTE — Progress Notes (Signed)
[facility] PROGRESS NOTE    Progress Note  Francisco [name]  [id number] DOB: 06-Jun-1933 DOA: 08/11/2020 PCP: Burnard Bunting, MD     Brief Narrative:   Francisco [name] is an 87 y.o. male history of pancreatic duodenectomy for pancreatic cyst, diabetes mellitus and BPH comes in for nausea vomiting diarrhea for couple of days.  In the ED was found to have a creatinine of 4 (baseline creatinine less than 1), potassium of 2.4 CT scan of the abdomen pelvis was unremarkable SARS-CoV-2 PCR was negative.   Assessment/Plan:   Acute kidney injury: Prerenal in the setting of Salmonella diarrhea nausea vomiting ketorolac use. NSAID stopped he was started on IV fluid his creatinine returned to baseline allow fluid diet tolerating his diet. Diarrhea is improving.  Physical therapy evaluation is pending  Salmonella diarrhea: Still having watery diarrhea but less than 6 bowel movements a day. Started on Levaquin on 08/13/2020 Continue Metamucil. Restrict dairy in diet.  Non-anion gap metabolic acidosis: Likely due to diarrhea start him on oral bicarbonate. Nausea vomiting and diarrhea: Likely due to gastroenteritis stool studies pending CT scan of the abdomen pelvis unremarkable.  Hypokalemia: Improved with oral repletion we will give an additional 2 doses today.  Hypovolemic hyponatremia: Resolved with IV fluid hydration.  Diabetes mellitus type 2 with hyperglycemia: Blood glucose significantly improved continue sliding scale insulin.  Benign prostatic hyperplasia Continue current home medications.   DVT prophylaxis: lovenox Family Communication:none Status is: Observation  The patient will require care spanning > 2 midnights and should be moved to inpatient because: Hemodynamically unstable  Dispo: The patient is from: Home              Anticipated d/c is to: Home              Patient currently is not medically stable to d/c.   Difficult to place patient  No        Code Status:     Code Status Orders  (From admission, onward)           Start     Ordered   08/12/20 0144  Full code  Continuous        08/12/20 0144           Code Status History     This patient has a current code status but no historical code status.         IV Access:   Peripheral IV   Procedures and diagnostic studies:   No results found.   Medical Consultants:   None.   Subjective:    Francisco [name] he relates his diarrhea has improved getting more formed abdominal pain is resolved.  Objective:    Vitals:   08/13/20 0407 08/13/20 1227 08/13/20 2100 08/14/20 0604  BP: 119/65 (!) 133/116 104/64 122/66  Pulse: 79 82 87 75  Resp: '16 20 20 16  '$ Temp: 98.1 F (36.7 C) 97.8 F (36.6 C) 98.5 F (36.9 C) 97.7 F (36.5 C)  TempSrc:  Oral Oral Oral  SpO2: 98% 100% 98% 100%  Weight:      Height:       SpO2: 100 %   Intake/Output Summary (Last 24 hours) at 08/14/2020 0928 Last data filed at 08/14/2020 0600 Gross per 24 hour  Intake 1841.11 ml  Output 700 ml  Net 1141.11 ml    Filed Weights   08/11/20 2152  Weight: 53.1 kg    Exam: General exam: In no acute distress. Respiratory  system: Good air movement and clear to auscultation. Cardiovascular system: S1 & S2 heard, RRR. No JVD. Gastrointestinal system: Abdomen is nondistended, soft and nontender.  Extremities: No pedal edema. Skin: No rashes, lesions or ulcers  Data Reviewed:    Labs: Basic Metabolic Panel: Recent Labs  Lab 08/11/20 2233 08/12/20 0016 08/12/20 0502 08/13/20 0644 08/13/20 1631 08/14/20 0529  NA 130*  --  132* 132* 132* 138  K 2.4*  --  2.5* 2.6* 2.9* 3.6  CL 95*  --  103 108 109 116*  CO2 15*  --  14* 15* 16* 16*  GLUCOSE 254*  --  166* 102* 203* 97  BUN 57*  --  55* 50* 41* 33*  CREATININE 4.10*  --  3.20* 1.81* 1.48* 1.15  CALCIUM 8.1*  --  7.9* 8.3* 8.4* 8.6*  MG  --  2.3  --   --   --   --     GFR Estimated Creatinine  Clearance: 35.9 mL/min (by C-G formula based on SCr of 1.15 mg/dL). Liver Function Tests: Recent Labs  Lab 08/11/20 2233 08/12/20 0502  AST 18 18  ALT 21 19  ALKPHOS 105 90  BILITOT 0.7 0.7  PROT 6.6 6.1*  ALBUMIN 3.0* 2.7*    Recent Labs  Lab 08/11/20 2233  LIPASE 17    No results for input(s): AMMONIA in the last 168 hours. Coagulation profile No results for input(s): INR, PROTIME in the last 168 hours. COVID-19 Labs  No results for input(s): DDIMER, FERRITIN, LDH, CRP in the last 72 hours.  Lab Results  Component Value Date   SARSCOV2NAA NEGATIVE 08/11/2020   SARSCOV2NAA NEGATIVE 09/02/2019   [place] NEGATIVE 08/18/2019    CBC: Recent Labs  Lab 08/11/20 2240 08/12/20 0502  WBC 5.2 5.0  NEUTROABS 4.7  --   HGB 16.2 14.8  HCT 48.9 43.9  MCV 88.6 88.3  PLT 241 227    Cardiac Enzymes: No results for input(s): CKTOTAL, CKMB, CKMBINDEX, TROPONINI in the last 168 hours. BNP (last 3 results) No results for input(s): PROBNP in the last 8760 hours. CBG: Recent Labs  Lab 08/13/20 0734 08/13/20 1144 08/13/20 1612 08/13/20 2101 08/14/20 0752  GLUCAP 101* 153* 221* 110* 99    D-Dimer: No results for input(s): DDIMER in the last 72 hours. Hgb A1c: No results for input(s): HGBA1C in the last 72 hours. Lipid Profile: No results for input(s): CHOL, HDL, LDLCALC, TRIG, CHOLHDL, LDLDIRECT in the last 72 hours. Thyroid function studies: No results for input(s): TSH, T4TOTAL, T3FREE, THYROIDAB in the last 72 hours.  Invalid input(s): FREET3 Anemia work up: No results for input(s): VITAMINB12, FOLATE, FERRITIN, TIBC, IRON, RETICCTPCT in the last 72 hours. Sepsis Labs: Recent Labs  Lab 08/11/20 2240 08/12/20 0502  WBC 5.2 5.0    Microbiology Recent Results (from the past 240 hour(s))  Resp Panel by RT-PCR (Flu A&B, Covid) Nasopharyngeal Swab     Status: None   Collection Time: 08/11/20 10:39 PM   Specimen: Nasopharyngeal Swab; Nasopharyngeal(NP)  swabs in vial transport medium  Result Value Ref Range Status   SARS Coronavirus 2 by RT PCR NEGATIVE NEGATIVE Final    Comment: (NOTE) SARS-CoV-2 target nucleic acids are NOT DETECTED.  The SARS-CoV-2 RNA is generally detectable in upper respiratory specimens during the acute phase of infection. The lowest concentration of SARS-CoV-2 viral copies this assay can detect is 138 copies/mL. A negative result does not preclude SARS-Cov-2 infection and should not be used as the sole basis  for treatment or other patient management decisions. A negative result may occur with  improper specimen collection/handling, submission of specimen other than nasopharyngeal swab, presence of viral mutation(s) within the areas targeted by this assay, and inadequate number of viral copies(<138 copies/mL). A negative result must be combined with clinical observations, patient history, and epidemiological information. The expected result is Negative.  Fact Sheet for Patients:  EntrepreneurPulse.com.au  Fact Sheet for Healthcare Providers:  IncredibleEmployment.be  This test is no t yet approved or cleared by the [place] FDA and  has been authorized for detection and/or diagnosis of SARS-CoV-2 by FDA under an Emergency Use Authorization (EUA). This EUA will remain  in effect (meaning this test can be used) for the duration of the COVID-19 declaration under Section 564(b)(1) of the Act, 21 U.S.C.section 360bbb-3(b)(1), unless the authorization is terminated  or revoked sooner.       Influenza A by PCR NEGATIVE NEGATIVE Final   Influenza B by PCR NEGATIVE NEGATIVE Final    Comment: (NOTE) The Xpert Xpress SARS-CoV-2/FLU/RSV plus assay is intended as an aid in the diagnosis of influenza from Nasopharyngeal swab specimens and should not be used as a sole basis for treatment. Nasal washings and aspirates are unacceptable for Xpert Xpress  SARS-CoV-2/FLU/RSV testing.  Fact Sheet for Patients: EntrepreneurPulse.com.au  Fact Sheet for Healthcare Providers: IncredibleEmployment.be  This test is not yet approved or cleared by the [place] FDA and has been authorized for detection and/or diagnosis of SARS-CoV-2 by FDA under an Emergency Use Authorization (EUA). This EUA will remain in effect (meaning this test can be used) for the duration of the COVID-19 declaration under Section 564(b)(1) of the Act, 21 U.S.C. section 360bbb-3(b)(1), unless the authorization is terminated or revoked.  Performed at [facility], [place] [address] [place], [place] [address]   Gastrointestinal Panel by PCR , Stool     Status: Abnormal   Collection Time: 08/12/20  1:30 AM   Specimen: Stool  Result Value Ref Range Status   Campylobacter species NOT DETECTED NOT DETECTED Final   Plesimonas shigelloides NOT DETECTED NOT DETECTED Final   Salmonella species DETECTED (A) NOT DETECTED Final    Comment: RESULT CALLED TO, READ BACK BY AND VERIFIED WITH: DR Venetia Constable ON 08/12/20 AT 1446 QSD    Yersinia enterocolitica NOT DETECTED NOT DETECTED Final   Vibrio species NOT DETECTED NOT DETECTED Final   Vibrio cholerae NOT DETECTED NOT DETECTED Final   Enteroaggregative E coli (EAEC) NOT DETECTED NOT DETECTED Final   Enteropathogenic E coli (EPEC) NOT DETECTED NOT DETECTED Final   Enterotoxigenic E coli (ETEC) NOT DETECTED NOT DETECTED Final   Shiga like toxin producing E coli (STEC) NOT DETECTED NOT DETECTED Final   Shigella/Enteroinvasive E coli (EIEC) NOT DETECTED NOT DETECTED Final   Cryptosporidium NOT DETECTED NOT DETECTED Final   Cyclospora cayetanensis NOT DETECTED NOT DETECTED Final   Entamoeba histolytica NOT DETECTED NOT DETECTED Final   Giardia lamblia NOT DETECTED NOT DETECTED Final   Adenovirus F40/41 NOT DETECTED NOT DETECTED Final   Astrovirus NOT DETECTED NOT DETECTED Final    Norovirus GI/GII NOT DETECTED NOT DETECTED Final   Rotavirus A NOT DETECTED NOT DETECTED Final   Sapovirus (I, II, IV, and V) NOT DETECTED NOT DETECTED Final    Comment: Performed at [facility], [address] [place], [place] [address]  C Difficile Quick Screen w PCR reflex     Status: None   Collection Time: 08/12/20  1:30 AM  Specimen: Stool  Result Value Ref Range Status   C Diff antigen NEGATIVE NEGATIVE Final   C Diff toxin NEGATIVE NEGATIVE Final   C Diff interpretation No C. difficile detected.  Final    Comment: Performed at [facility], [place] [address] [place], [place] [address]     Medications:    finasteride  5 mg Oral Daily   heparin  5,000 Units Subcutaneous Q8H   insulin aspart  0-6 Units Subcutaneous TID WC   levofloxacin  250 mg Oral Daily   potassium chloride  40 mEq Oral BID   psyllium  1 packet Oral Daily   tamsulosin  0.4 mg Oral BID   Continuous Infusions:  ondansetron (ZOFRAN) IV        LOS: 2 days   Charlynne Cousins  [facility]  08/14/2020, 9:28 AM

## 2020-08-14 NOTE — Progress Notes (Signed)
MD advises pt to drink as much fluids as possible orally, so please do not take pts drinks away from him at night. Pt stated " MD told me to drink a lot of fluids, and the nurses at night are telling me not to and taking my drinks so I don't poop, I am really confused on what to do". Messaged MD for clarification.

## 2020-08-14 NOTE — Progress Notes (Signed)
Pt refused ted hoes x3 throughout the day. Pt c/o dizziness.last bm pt and NT report no blood. Consented  to  colonoscopy, water enema in a.m Clear liquids until 0500 NPO.

## 2020-08-14 NOTE — Evaluation (Signed)
Physical Therapy Evaluation Patient Details Name: Francisco [name] MRN: [id number] DOB: [birth year] Today's Date: 08/14/2020   History of Present Illness  87 yo male admitted with N/V/D, ARF. Hx of pancreatic cyst, DM, BPH, asthma, whipple 2013  Clinical Impression  On eval, pt was Supv-Min guard for mobility. He walked around the room, ~40 feet. He tolerated activity well. He is unsteady at times and had to intermittently steady himself. No overt LOB. He reports he has been walking to/from bathroom unassisted on today. Discussed d/c plan-pt will return home where he lives with his wife. Could possibly benefit from cane use for ambulation safety. He politely declines HHPT f/u.     Follow Up Recommendations No PT follow up;Supervision for mobility/OOB (pt politely declines HHPT f/u)    Equipment Recommendations  None recommended by PT (pt stated he has access to DME if he feels he needs it)    Recommendations for Other Services       Precautions / Restrictions Precautions Precautions: Fall Restrictions Weight Bearing Restrictions: No      Mobility  Bed Mobility Overal bed mobility: Needs Assistance Bed Mobility: Supine to Sit     Supine to sit: Modified independent (Device/Increase time);HOB elevated          Transfers Overall transfer level: Needs assistance   Transfers: Sit to/from Stand Sit to Stand: Supervision         General transfer comment: Supv for safety  Ambulation/Gait Ambulation/Gait assistance: Min guard Gait Distance (Feet): 40 Feet Assistive device: None Gait Pattern/deviations: Step-through pattern;Decreased stride length     General Gait Details: walked around room. Intermittent use of hands to steady himself on furniture. 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        Extremity/Trunk Assessment   Upper Extremity Assessment Upper Extremity Assessment: Overall WFL for tasks assessed    Lower Extremity Assessment Lower Extremity Assessment: Generalized weakness    Cervical / Trunk Assessment Cervical / Trunk Assessment: Normal  Communication   Communication: No difficulties  Cognition Arousal/Alertness: Awake/alert Behavior During Therapy: WFL for tasks assessed/performed Overall Cognitive Status: Within Functional Limits for tasks assessed                                        General Comments      Exercises     Assessment/Plan    PT Assessment Patient needs continued PT services  PT Problem List Decreased strength;Decreased mobility;Decreased balance;Decreased activity tolerance;Decreased knowledge of use of  DME       PT Treatment Interventions DME instruction;Gait training;Therapeutic exercise;Balance training;Functional mobility training;Therapeutic activities;Patient/family education    PT Goals (Current goals can be found in the Care Plan section)  Acute Rehab PT Goals Patient Stated Goal: home! PT Goal Formulation: With patient Time For Goal Achievement: 08/28/20 Potential to Achieve Goals: Good    Frequency Min 3X/week   Barriers to discharge        Co-evaluation               AM-PAC PT "6 Clicks" Mobility  Outcome Measure Help needed turning from your back to your side while in a flat bed without using bedrails?: None Help  needed moving from lying on your back to sitting on the side of a flat bed without using bedrails?: None Help needed moving to and from a bed to a chair (including a wheelchair)?: A Little Help needed standing up from a chair using your arms (e.g., wheelchair or bedside chair)?: A Little Help needed to walk in hospital room?: A Little Help needed climbing 3-5 steps with a railing? : A Little 6 Click Score: 20    End of Session   Activity Tolerance: Patient tolerated treatment well Patient left: in bed;with call bell/phone within reach   PT Visit Diagnosis: Unsteadiness on feet (R26.81);Muscle weakness (generalized) (M62.81)    Time: 1211-1221 PT Time Calculation (min) (ACUTE ONLY): 10 min   Charges:   PT Evaluation $PT Eval Moderate Complexity: 1 Mod            Doreatha Massed, PT Acute Rehabilitation  Office: [phone] Pager: [phone]

## 2020-08-15 DIAGNOSIS — N400 Enlarged prostate without lower urinary tract symptoms: Secondary | ICD-10-CM

## 2020-08-15 LAB — BASIC METABOLIC PANEL
Anion gap: 5 (ref 5–15)
BUN: 24 mg/dL — ABNORMAL HIGH (ref 8–23)
CO2: 15 mmol/L — ABNORMAL LOW (ref 22–32)
Calcium: 8.7 mg/dL — ABNORMAL LOW (ref 8.9–10.3)
Chloride: 115 mmol/L — ABNORMAL HIGH (ref 98–111)
Creatinine, Ser: 0.91 mg/dL (ref 0.61–1.24)
GFR, Estimated: >60 mL/min (ref >60)
Glucose, Bld: 149 mg/dL — ABNORMAL HIGH (ref 70–99)
Potassium: 4.7 mmol/L (ref 3.5–5.1)
Sodium: 135 mmol/L (ref 135–145)

## 2020-08-15 LAB — GLUCOSE, CAPILLARY: Glucose-Capillary: 85 mg/dL (ref 70–99)

## 2020-08-15 MED ORDER — SODIUM BICARBONATE 650 MG PO TABS
1300.0000 mg | ORAL_TABLET | Freq: Three times a day (TID) | ORAL | Status: DC
  Administered 2020-08-15: 1300 mg via ORAL
  Filled 2020-08-15: qty 2

## 2020-08-15 MED ORDER — LEVOFLOXACIN 250 MG PO TABS: 250.0000 mg | ORAL_TABLET | Freq: Every day | ORAL | 0 refills | Status: AC

## 2020-08-15 NOTE — Discharge Summary (Signed)
Physician Discharge Summary  Francisco [name] [id number] DOB: 1933-02-11 DOA: 08/11/2020  PCP: Burnard Bunting, MD  Admit date: 08/11/2020 Discharge date: 08/15/2020  Admitted From: home Disposition:  home  Recommendations for Outpatient Follow-up:  Follow up with PCP in 1-2 weeks Please obtain BMP/CBC in one week   Home Health:no Equipment/Devices:None  Discharge Condition:Stable CODE STATUS:Full Diet recommendation: Heart Healthy  Brief/Interim Summary:  87 y.o. male history of pancreatic duodenectomy for pancreatic cyst, diabetes mellitus and BPH comes in for nausea vomiting diarrhea for couple of days.  In the ED was found to have a creatinine of 4 (baseline creatinine less than 1), potassium of 2.4 CT scan of the abdomen pelvis was unremarkable SARS-CoV-2 PCR was negative.      Discharge Diagnoses:  Active Problems:   Benign prostatic hyperplasia   Diabetes mellitus type 2, uncomplicated (HCC)   ARF (acute renal failure) (HCC)   Nausea vomiting and diarrhea   AKI (acute kidney injury) ([place])  Acute kidney injury: Likely prerenal in the setting of diarrhea and ketorolac use. NSAIDs.  He was fluid resuscitated antihypertensive medications were held his creatinine returned to baseline.  Salmonella diarrhea: He was started on Levaquin on 08/13/2020 and his diarrhea slowly improved he was started on Metamucil and his diarrhea resolved. He will continue Levaquin as an outpatient and follow-up with his PCP.  Non-anion gap metabolic acidosis: Likely due to diarrhea he was started on bicarbonate tablets is resolved.  Hypokalemia: Likely due to GI losses did resolve with oral repletion.  Hypovolemic hyponatremia: Resolved with IV fluid hydration.  Diabetes mellitus type 2 with hyperglycemia: No changes made to his medication.  BPH: No changes made to his medication.  Discharge Instructions  Discharge Instructions     Diet - low sodium heart healthy   Complete  by: As directed    Increase activity slowly   Complete by: As directed       Allergies as of 08/15/2020       Reactions   Metformin Hcl Other (See Comments)   GI upset        Medication List     TAKE these medications    Accu-Chek FastClix Lancets Misc by Does not apply route daily.   Align 4 MG Caps Take 4 mg by mouth daily.   APPLE CIDER VINEGAR PO Take 1 capsule by mouth daily.   CINNAMON PO Take 2,000 mg by mouth daily.   Creon 3000-9500 units Cpep Generic drug: Pancrelipase (Lip-Prot-Amyl) Take 2 capsules by mouth 3 (three) times daily before meals.   empagliflozin 10 MG Tabs tablet Commonly known as: JARDIANCE Take 5 mg by mouth daily.   esomeprazole 40 MG capsule Commonly known as: NEXIUM Take 40 mg by mouth 2 (two) times daily.   finasteride 5 MG tablet Commonly known as: PROSCAR Take 5 mg by mouth daily.   furosemide 20 MG tablet Commonly known as: LASIX Take 20 mg by mouth daily.   levofloxacin 250 MG tablet Commonly known as: LEVAQUIN Take 1 tablet (250 mg total) by mouth daily for 10 days.   multivitamin tablet Take 1 tablet by mouth daily.   tamsulosin 0.4 MG Caps capsule Commonly known as: FLOMAX Take 0.4 mg by mouth 2 (two) times daily.   triamcinolone cream 0.1 % Commonly known as: KENALOG Apply 1 application topically 3 (three) times daily as needed (itching).        Allergies  Allergen Reactions   Metformin Hcl Other (See Comments)    GI  upset    Consultations: None   Procedures/Studies: CT ABDOMEN PELVIS WO CONTRAST  Result Date: 08/11/2020 CLINICAL DATA:  Nausea vomiting EXAM: CT ABDOMEN AND PELVIS WITHOUT CONTRAST TECHNIQUE: Multidetector CT imaging of the abdomen and pelvis was performed following the standard protocol without IV contrast. COMPARISON:  CT 05/14/2012, 08/12/2019 FINDINGS: Lower chest: Lung bases demonstrate no acute consolidation or effusion. Normal cardiac size. Hepatobiliary: Previously noted  left hepatic lobe cyst not well seen today. Status post cholecystectomy. Lobulated liver contour. Pneumobilia present today, not seen on 2021 comparison but present on 2014 postoperative comparison. This is presumably related to choledochal jejunostomy. Pancreas: Status post partial pancreatectomy. No inflammatory changes. Spleen: Normal in size without focal abnormality. Adrenals/Urinary Tract: Adrenal glands are within normal limits. Cyst in the mid right kidney. No hydronephrosis. The bladder is nearly empty and slightly thick-walled. Stomach/Bowel: Distal gastrectomy. No dilated small or large bowel. No acute bowel wall thickening. Negative appendix Vascular/Lymphatic: Advanced aortic atherosclerosis. No aneurysm. No suspicious nodes Reproductive: Massively enlarged prostate with mass effect on the bladder Other: Negative for pelvic effusion or free air Musculoskeletal: Chronic compression deformity at L1. No acute or suspicious osseous abnormality. Chronic sclerosis in the right iliac bone. IMPRESSION: 1. Negative for bowel obstruction or bowel wall thickening. 2. Patient is status post partial pancreatectomy and distal gastrectomy. Interim finding of pneumobilia presumably related to choledochal jejunostomy, some pneumobilia evident on prior CT from 2014. 3. Massively enlarged prostate with mass effect on the bladder. Bladder is slightly thick walled Electronically Signed   By: Donavan Foil M.D.   On: 08/11/2020 23:30   DG Chest 2 View  Result Date: 08/11/2020 CLINICAL DATA:  Cough EXAM: CHEST - 2 VIEW COMPARISON:  None. FINDINGS: The heart size and mediastinal contours are within normal limits. Both lungs are clear. The visualized skeletal structures are unremarkable. IMPRESSION: No active cardiopulmonary disease. Electronically Signed   By: Rolm Baptise M.D.   On: 08/11/2020 22:53   (Echo, Carotid, EGD, Colonoscopy, ERCP)    Subjective: No complaints  Discharge Exam: Vitals:   08/14/20 2009  08/15/20 0522  BP: 110/66 122/66  Pulse: 75 66  Resp: 16 16  Temp: 97.9 F (36.6 C) 97.6 F (36.4 C)  SpO2: 97% 98%   Vitals:   08/14/20 0604 08/14/20 1433 08/14/20 2009 08/15/20 0522  BP: 122/66 108/74 110/66 122/66  Pulse: 75 92 75 66  Resp: '16 17 16 16  '$ Temp: 97.7 F (36.5 C) (!) 97.4 F (36.3 C) 97.9 F (36.6 C) 97.6 F (36.4 C)  TempSrc: Oral Oral Oral Oral  SpO2: 100% 98% 97% 98%  Weight:      Height:        General: Pt is alert, awake, not in acute distress Cardiovascular: RRR, S1/S2 +, no rubs, no gallops Respiratory: CTA bilaterally, no wheezing, no rhonchi Abdominal: Soft, NT, ND, bowel sounds + Extremities: no edema, no cyanosis    The results of significant diagnostics from this hospitalization (including imaging, microbiology, ancillary and laboratory) are listed below for reference.     Microbiology: Recent Results (from the past 240 hour(s))  Resp Panel by RT-PCR (Flu A&B, Covid) Nasopharyngeal Swab     Status: None   Collection Time: 08/11/20 10:39 PM   Specimen: Nasopharyngeal Swab; Nasopharyngeal(NP) swabs in vial transport medium  Result Value Ref Range Status   SARS Coronavirus 2 by RT PCR NEGATIVE NEGATIVE Final    Comment: (NOTE) SARS-CoV-2 target nucleic acids are NOT DETECTED.  The SARS-CoV-2 RNA is  generally detectable in upper respiratory specimens during the acute phase of infection. The lowest concentration of SARS-CoV-2 viral copies this assay can detect is 138 copies/mL. A negative result does not preclude SARS-Cov-2 infection and should not be used as the sole basis for treatment or other patient management decisions. A negative result may occur with  improper specimen collection/handling, submission of specimen other than nasopharyngeal swab, presence of viral mutation(s) within the areas targeted by this assay, and inadequate number of viral copies(<138 copies/mL). A negative result must be combined with clinical observations,  patient history, and epidemiological information. The expected result is Negative.  Fact Sheet for Patients:  EntrepreneurPulse.com.au  Fact Sheet for Healthcare Providers:  IncredibleEmployment.be  This test is no t yet approved or cleared by the [place] FDA and  has been authorized for detection and/or diagnosis of SARS-CoV-2 by FDA under an Emergency Use Authorization (EUA). This EUA will remain  in effect (meaning this test can be used) for the duration of the COVID-19 declaration under Section 564(b)(1) of the Act, 21 U.S.C.section 360bbb-3(b)(1), unless the authorization is terminated  or revoked sooner.       Influenza A by PCR NEGATIVE NEGATIVE Final   Influenza B by PCR NEGATIVE NEGATIVE Final    Comment: (NOTE) The Xpert Xpress SARS-CoV-2/FLU/RSV plus assay is intended as an aid in the diagnosis of influenza from Nasopharyngeal swab specimens and should not be used as a sole basis for treatment. Nasal washings and aspirates are unacceptable for Xpert Xpress SARS-CoV-2/FLU/RSV testing.  Fact Sheet for Patients: EntrepreneurPulse.com.au  Fact Sheet for Healthcare Providers: IncredibleEmployment.be  This test is not yet approved or cleared by the [place] FDA and has been authorized for detection and/or diagnosis of SARS-CoV-2 by FDA under an Emergency Use Authorization (EUA). This EUA will remain in effect (meaning this test can be used) for the duration of the COVID-19 declaration under Section 564(b)(1) of the Act, 21 U.S.C. section 360bbb-3(b)(1), unless the authorization is terminated or revoked.  Performed at [facility], [place] [address], [place], [place] [address]   Gastrointestinal Panel by PCR , Stool     Status: Abnormal   Collection Time: 08/12/20  1:30 AM   Specimen: Stool  Result Value Ref Range Status   Campylobacter species NOT DETECTED NOT  DETECTED Final   Plesimonas shigelloides NOT DETECTED NOT DETECTED Final   Salmonella species DETECTED (A) NOT DETECTED Final    Comment: RESULT CALLED TO, READ BACK BY AND VERIFIED WITH: DR Venetia Constable ON 08/12/20 AT 1446 QSD    Yersinia enterocolitica NOT DETECTED NOT DETECTED Final   Vibrio species NOT DETECTED NOT DETECTED Final   Vibrio cholerae NOT DETECTED NOT DETECTED Final   Enteroaggregative E coli (EAEC) NOT DETECTED NOT DETECTED Final   Enteropathogenic E coli (EPEC) NOT DETECTED NOT DETECTED Final   Enterotoxigenic E coli (ETEC) NOT DETECTED NOT DETECTED Final   Shiga like toxin producing E coli (STEC) NOT DETECTED NOT DETECTED Final   Shigella/Enteroinvasive E coli (EIEC) NOT DETECTED NOT DETECTED Final   Cryptosporidium NOT DETECTED NOT DETECTED Final   Cyclospora cayetanensis NOT DETECTED NOT DETECTED Final   Entamoeba histolytica NOT DETECTED NOT DETECTED Final   Giardia lamblia NOT DETECTED NOT DETECTED Final   Adenovirus F40/41 NOT DETECTED NOT DETECTED Final   Astrovirus NOT DETECTED NOT DETECTED Final   Norovirus GI/GII NOT DETECTED NOT DETECTED Final   Rotavirus A NOT DETECTED NOT DETECTED Final   Sapovirus (I, II, IV, and V)  NOT DETECTED NOT DETECTED Final    Comment: Performed at [facility], [place], [place] [address]  C Difficile Quick Screen w PCR reflex     Status: None   Collection Time: 08/12/20  1:30 AM   Specimen: Stool  Result Value Ref Range Status   C Diff antigen NEGATIVE NEGATIVE Final   C Diff toxin NEGATIVE NEGATIVE Final   C Diff interpretation No C. difficile detected.  Final    Comment: Performed at [facility], [place] [address], [place], [place] [address]     Labs: BNP (last 3 results) No results for input(s): BNP in the last 8760 hours. Basic Metabolic Panel: Recent Labs  Lab 08/11/20 2233 08/12/20 0016 08/12/20 0502 08/13/20 0644 08/13/20 1631 08/14/20 0529  NA 130*  --  132* 132* 132* 138   K 2.4*  --  2.5* 2.6* 2.9* 3.6  CL 95*  --  103 108 109 116*  CO2 15*  --  14* 15* 16* 16*  GLUCOSE 254*  --  166* 102* 203* 97  BUN 57*  --  55* 50* 41* 33*  CREATININE 4.10*  --  3.20* 1.81* 1.48* 1.15  CALCIUM 8.1*  --  7.9* 8.3* 8.4* 8.6*  MG  --  2.3  --   --   --   --    Liver Function Tests: Recent Labs  Lab 08/11/20 2233 08/12/20 0502  AST 18 18  ALT 21 19  ALKPHOS 105 90  BILITOT 0.7 0.7  PROT 6.6 6.1*  ALBUMIN 3.0* 2.7*   Recent Labs  Lab 08/11/20 2233  LIPASE 17   No results for input(s): AMMONIA in the last 168 hours. CBC: Recent Labs  Lab 08/11/20 2240 08/12/20 0502  WBC 5.2 5.0  NEUTROABS 4.7  --   HGB 16.2 14.8  HCT 48.9 43.9  MCV 88.6 88.3  PLT 241 227   Cardiac Enzymes: No results for input(s): CKTOTAL, CKMB, CKMBINDEX, TROPONINI in the last 168 hours. BNP: Invalid input(s): POCBNP CBG: Recent Labs  Lab 08/14/20 0752 08/14/20 1208 08/14/20 1620 08/14/20 2006 08/15/20 0712  GLUCAP 99 138* 160* 107* 85   D-Dimer No results for input(s): DDIMER in the last 72 hours. Hgb A1c No results for input(s): HGBA1C in the last 72 hours. Lipid Profile No results for input(s): CHOL, HDL, LDLCALC, TRIG, CHOLHDL, LDLDIRECT in the last 72 hours. Thyroid function studies No results for input(s): TSH, T4TOTAL, T3FREE, THYROIDAB in the last 72 hours.  Invalid input(s): FREET3 Anemia work up No results for input(s): VITAMINB12, FOLATE, FERRITIN, TIBC, IRON, RETICCTPCT in the last 72 hours. Urinalysis    Component Value Date/Time   COLORURINE AMBER (A) 08/12/2020 0424   APPEARANCEUR CLOUDY (A) 08/12/2020 0424   LABSPEC 1.014 08/12/2020 0424   PHURINE 5.0 08/12/2020 0424   GLUCOSEU NEGATIVE 08/12/2020 0424   HGBUR MODERATE (A) 08/12/2020 0424   BILIRUBINUR NEGATIVE 08/12/2020 0424   KETONESUR 5 (A) 08/12/2020 0424   PROTEINUR 30 (A) 08/12/2020 0424   UROBILINOGEN 0.2 05/14/2012 0030   NITRITE POSITIVE (A) 08/12/2020 0424   LEUKOCYTESUR LARGE  (A) 08/12/2020 0424   Sepsis Labs Invalid input(s): PROCALCITONIN,  WBC,  LACTICIDVEN Microbiology Recent Results (from the past 240 hour(s))  Resp Panel by RT-PCR (Flu A&B, Covid) Nasopharyngeal Swab     Status: None   Collection Time: 08/11/20 10:39 PM   Specimen: Nasopharyngeal Swab; Nasopharyngeal(NP) swabs in vial transport medium  Result Value Ref Range Status  SARS Coronavirus 2 by RT PCR NEGATIVE NEGATIVE Final    Comment: (NOTE) SARS-CoV-2 target nucleic acids are NOT DETECTED.  The SARS-CoV-2 RNA is generally detectable in upper respiratory specimens during the acute phase of infection. The lowest concentration of SARS-CoV-2 viral copies this assay can detect is 138 copies/mL. A negative result does not preclude SARS-Cov-2 infection and should not be used as the sole basis for treatment or other patient management decisions. A negative result may occur with  improper specimen collection/handling, submission of specimen other than nasopharyngeal swab, presence of viral mutation(s) within the areas targeted by this assay, and inadequate number of viral copies(<138 copies/mL). A negative result must be combined with clinical observations, patient history, and epidemiological information. The expected result is Negative.  Fact Sheet for Patients:  EntrepreneurPulse.com.au  Fact Sheet for Healthcare Providers:  IncredibleEmployment.be  This test is no t yet approved or cleared by the [place] FDA and  has been authorized for detection and/or diagnosis of SARS-CoV-2 by FDA under an Emergency Use Authorization (EUA). This EUA will remain  in effect (meaning this test can be used) for the duration of the COVID-19 declaration under Section 564(b)(1) of the Act, 21 U.S.C.section 360bbb-3(b)(1), unless the authorization is terminated  or revoked sooner.       Influenza A by PCR NEGATIVE NEGATIVE Final   Influenza B by PCR NEGATIVE  NEGATIVE Final    Comment: (NOTE) The Xpert Xpress SARS-CoV-2/FLU/RSV plus assay is intended as an aid in the diagnosis of influenza from Nasopharyngeal swab specimens and should not be used as a sole basis for treatment. Nasal washings and aspirates are unacceptable for Xpert Xpress SARS-CoV-2/FLU/RSV testing.  Fact Sheet for Patients: EntrepreneurPulse.com.au  Fact Sheet for Healthcare Providers: IncredibleEmployment.be  This test is not yet approved or cleared by the [place] FDA and has been authorized for detection and/or diagnosis of SARS-CoV-2 by FDA under an Emergency Use Authorization (EUA). This EUA will remain in effect (meaning this test can be used) for the duration of the COVID-19 declaration under Section 564(b)(1) of the Act, 21 U.S.C. section 360bbb-3(b)(1), unless the authorization is terminated or revoked.  Performed at [facility], [place] [address], [place], [place] [address]   Gastrointestinal Panel by PCR , Stool     Status: Abnormal   Collection Time: 08/12/20  1:30 AM   Specimen: Stool  Result Value Ref Range Status   Campylobacter species NOT DETECTED NOT DETECTED Final   Plesimonas shigelloides NOT DETECTED NOT DETECTED Final   Salmonella species DETECTED (A) NOT DETECTED Final    Comment: RESULT CALLED TO, READ BACK BY AND VERIFIED WITH: DR Venetia Constable ON 08/12/20 AT 1446 QSD    Yersinia enterocolitica NOT DETECTED NOT DETECTED Final   Vibrio species NOT DETECTED NOT DETECTED Final   Vibrio cholerae NOT DETECTED NOT DETECTED Final   Enteroaggregative E coli (EAEC) NOT DETECTED NOT DETECTED Final   Enteropathogenic E coli (EPEC) NOT DETECTED NOT DETECTED Final   Enterotoxigenic E coli (ETEC) NOT DETECTED NOT DETECTED Final   Shiga like toxin producing E coli (STEC) NOT DETECTED NOT DETECTED Final   Shigella/Enteroinvasive E coli (EIEC) NOT DETECTED NOT DETECTED Final   Cryptosporidium NOT DETECTED  NOT DETECTED Final   Cyclospora cayetanensis NOT DETECTED NOT DETECTED Final   Entamoeba histolytica NOT DETECTED NOT DETECTED Final   Giardia lamblia NOT DETECTED NOT DETECTED Final   Adenovirus F40/41 NOT DETECTED NOT DETECTED Final   Astrovirus NOT DETECTED NOT DETECTED Final  Norovirus GI/GII NOT DETECTED NOT DETECTED Final   Rotavirus A NOT DETECTED NOT DETECTED Final   Sapovirus (I, II, IV, and V) NOT DETECTED NOT DETECTED Final    Comment: Performed at [facility], [place], [place] [address]  C Difficile Quick Screen w PCR reflex     Status: None   Collection Time: 08/12/20  1:30 AM   Specimen: Stool  Result Value Ref Range Status   C Diff antigen NEGATIVE NEGATIVE Final   C Diff toxin NEGATIVE NEGATIVE Final   C Diff interpretation No C. difficile detected.  Final    Comment: Performed at [facility], [place] [address], [place], [place] [address]    SIGNED:   Charlynne Cousins, MD  [facility] 08/15/2020, 8:28 AM Pager   If 7PM-7AM, please contact night-coverage [email or web address] Password [email or web address]

## 2020-08-15 NOTE — Progress Notes (Signed)
[facility] PROGRESS NOTE    Progress Note  Francisco [name]  [id number] DOB: 07-19-1933 DOA: 08/11/2020 PCP: Burnard Bunting, MD     Brief Narrative:   Francisco [name] is an 87 y.o. male history of pancreatic duodenectomy for pancreatic cyst, diabetes mellitus and BPH comes in for nausea vomiting diarrhea for couple of days.  In the ED was found to have a creatinine of 4 (baseline creatinine less than 1), potassium of 2.4 CT scan of the abdomen pelvis was unremarkable SARS-CoV-2 PCR was negative.   Assessment/Plan:   Acute kidney injury: Prerenal in the setting of Salmonella diarrhea nausea vomiting ketorolac use. NSAID stopped he was started on IV fluid his creatinine returned to baseline allow fluid diet tolerating his diet. Diarrhea is improving.  Physical therapy evaluation is pending  Salmonella diarrhea: Still having watery diarrhea but less than 6 bowel movements a day. Started on Levaquin on 08/13/2020 Continue Metamucil. Restrict dairy in diet.  Non-anion gap metabolic acidosis: Likely due to diarrhea start him on oral bicarbonate. Nausea vomiting and diarrhea: Likely due to gastroenteritis stool studies pending CT scan of the abdomen pelvis unremarkable.  Hypokalemia: Improved with oral repletion we will give an additional 2 doses today.  Hypovolemic hyponatremia: Resolved with IV fluid hydration.  Diabetes mellitus type 2 with hyperglycemia: Blood glucose significantly improved continue sliding scale insulin.  Benign prostatic hyperplasia Continue current home medications.   DVT prophylaxis: lovenox Family Communication:none Status is: Observation  The patient will require care spanning > 2 midnights and should be moved to inpatient because: Hemodynamically unstable  Dispo: The patient is from: Home              Anticipated d/c is to: Home              Patient currently is not medically stable to d/c.   Difficult to place patient  No        Code Status:     Code Status Orders  (From admission, onward)           Start     Ordered   08/12/20 0144  Full code  Continuous        08/12/20 0144           Code Status History     This patient has a current code status but no historical code status.         IV Access:   Peripheral IV   Procedures and diagnostic studies:   No results found.   Medical Consultants:   None.   Subjective:    [name] he relates his diarrhea has improved getting more formed abdominal pain is resolved.  Objective:    Vitals:   08/14/20 0604 08/14/20 1433 08/14/20 2009 08/15/20 0522  BP: 122/66 108/74 110/66 122/66  Pulse: 75 92 75 66  Resp: '16 17 16 16  '$ Temp: 97.7 F (36.5 C) (!) 97.4 F (36.3 C) 97.9 F (36.6 C) 97.6 F (36.4 C)  TempSrc: Oral Oral Oral Oral  SpO2: 100% 98% 97% 98%  Weight:      Height:       SpO2: 98 %   Intake/Output Summary (Last 24 hours) at 08/15/2020 0815 Last data filed at 08/14/2020 1500 Gross per 24 hour  Intake 472 ml  Output --  Net 472 ml    Filed Weights   08/11/20 2152  Weight: 53.1 kg    Exam: General exam: In no acute distress. Respiratory system:  Good air movement and clear to auscultation. Cardiovascular system: S1 & S2 heard, RRR. No JVD. Gastrointestinal system: Abdomen is nondistended, soft and nontender.  Extremities: No pedal edema. Skin: No rashes, lesions or ulcers  Data Reviewed:    Labs: Basic Metabolic Panel: Recent Labs  Lab 08/11/20 2233 08/12/20 0016 08/12/20 0502 08/13/20 0644 08/13/20 1631 08/14/20 0529  NA 130*  --  132* 132* 132* 138  K 2.4*  --  2.5* 2.6* 2.9* 3.6  CL 95*  --  103 108 109 116*  CO2 15*  --  14* 15* 16* 16*  GLUCOSE 254*  --  166* 102* 203* 97  BUN 57*  --  55* 50* 41* 33*  CREATININE 4.10*  --  3.20* 1.81* 1.48* 1.15  CALCIUM 8.1*  --  7.9* 8.3* 8.4* 8.6*  MG  --  2.3  --   --   --   --     GFR Estimated Creatinine Clearance: 35.9  mL/min (by C-G formula based on SCr of 1.15 mg/dL). Liver Function Tests: Recent Labs  Lab 08/11/20 2233 08/12/20 0502  AST 18 18  ALT 21 19  ALKPHOS 105 90  BILITOT 0.7 0.7  PROT 6.6 6.1*  ALBUMIN 3.0* 2.7*    Recent Labs  Lab 08/11/20 2233  LIPASE 17    No results for input(s): AMMONIA in the last 168 hours. Coagulation profile No results for input(s): INR, PROTIME in the last 168 hours. COVID-19 Labs  No results for input(s): DDIMER, FERRITIN, LDH, CRP in the last 72 hours.  Lab Results  Component Value Date   SARSCOV2NAA NEGATIVE 08/11/2020   SARSCOV2NAA NEGATIVE 09/02/2019   [place] NEGATIVE 08/18/2019    CBC: Recent Labs  Lab 08/11/20 2240 08/12/20 0502  WBC 5.2 5.0  NEUTROABS 4.7  --   HGB 16.2 14.8  HCT 48.9 43.9  MCV 88.6 88.3  PLT 241 227    Cardiac Enzymes: No results for input(s): CKTOTAL, CKMB, CKMBINDEX, TROPONINI in the last 168 hours. BNP (last 3 results) No results for input(s): PROBNP in the last 8760 hours. CBG: Recent Labs  Lab 08/14/20 0752 08/14/20 1208 08/14/20 1620 08/14/20 2006 08/15/20 0712  GLUCAP 99 138* 160* 107* 85    D-Dimer: No results for input(s): DDIMER in the last 72 hours. Hgb A1c: No results for input(s): HGBA1C in the last 72 hours. Lipid Profile: No results for input(s): CHOL, HDL, LDLCALC, TRIG, CHOLHDL, LDLDIRECT in the last 72 hours. Thyroid function studies: No results for input(s): TSH, T4TOTAL, T3FREE, THYROIDAB in the last 72 hours.  Invalid input(s): FREET3 Anemia work up: No results for input(s): VITAMINB12, FOLATE, FERRITIN, TIBC, IRON, RETICCTPCT in the last 72 hours. Sepsis Labs: Recent Labs  Lab 08/11/20 2240 08/12/20 0502  WBC 5.2 5.0    Microbiology Recent Results (from the past 240 hour(s))  Resp Panel by RT-PCR (Flu A&B, Covid) Nasopharyngeal Swab     Status: None   Collection Time: 08/11/20 10:39 PM   Specimen: Nasopharyngeal Swab; Nasopharyngeal(NP) swabs in vial  transport medium  Result Value Ref Range Status   SARS Coronavirus 2 by RT PCR NEGATIVE NEGATIVE Final    Comment: (NOTE) SARS-CoV-2 target nucleic acids are NOT DETECTED.  The SARS-CoV-2 RNA is generally detectable in upper respiratory specimens during the acute phase of infection. The lowest concentration of SARS-CoV-2 viral copies this assay can detect is 138 copies/mL. A negative result does not preclude SARS-Cov-2 infection and should not be used as the sole basis for  treatment or other patient management decisions. A negative result may occur with  improper specimen collection/handling, submission of specimen other than nasopharyngeal swab, presence of viral mutation(s) within the areas targeted by this assay, and inadequate number of viral copies(<138 copies/mL). A negative result must be combined with clinical observations, patient history, and epidemiological information. The expected result is Negative.  Fact Sheet for Patients:  EntrepreneurPulse.com.au  Fact Sheet for Healthcare Providers:  IncredibleEmployment.be  This test is no t yet approved or cleared by the [place] FDA and  has been authorized for detection and/or diagnosis of SARS-CoV-2 by FDA under an Emergency Use Authorization (EUA). This EUA will remain  in effect (meaning this test can be used) for the duration of the COVID-19 declaration under Section 564(b)(1) of the Act, 21 U.S.C.section 360bbb-3(b)(1), unless the authorization is terminated  or revoked sooner.       Influenza A by PCR NEGATIVE NEGATIVE Final   Influenza B by PCR NEGATIVE NEGATIVE Final    Comment: (NOTE) The Xpert Xpress SARS-CoV-2/FLU/RSV plus assay is intended as an aid in the diagnosis of influenza from Nasopharyngeal swab specimens and should not be used as a sole basis for treatment. Nasal washings and aspirates are unacceptable for Xpert Xpress SARS-CoV-2/FLU/RSV testing.  Fact  Sheet for Patients: EntrepreneurPulse.com.au  Fact Sheet for Healthcare Providers: IncredibleEmployment.be  This test is not yet approved or cleared by the [place] FDA and has been authorized for detection and/or diagnosis of SARS-CoV-2 by FDA under an Emergency Use Authorization (EUA). This EUA will remain in effect (meaning this test can be used) for the duration of the COVID-19 declaration under Section 564(b)(1) of the Act, 21 U.S.C. section 360bbb-3(b)(1), unless the authorization is terminated or revoked.  Performed at [facility], [place] [address] [place], [place] [address]   Gastrointestinal Panel by PCR , Stool     Status: Abnormal   Collection Time: 08/12/20  1:30 AM   Specimen: Stool  Result Value Ref Range Status   Campylobacter species NOT DETECTED NOT DETECTED Final   Plesimonas shigelloides NOT DETECTED NOT DETECTED Final   Salmonella species DETECTED (A) NOT DETECTED Final    Comment: RESULT CALLED TO, READ BACK BY AND VERIFIED WITH: DR Venetia Constable ON 08/12/20 AT 1446 QSD    Yersinia enterocolitica NOT DETECTED NOT DETECTED Final   Vibrio species NOT DETECTED NOT DETECTED Final   Vibrio cholerae NOT DETECTED NOT DETECTED Final   Enteroaggregative E coli (EAEC) NOT DETECTED NOT DETECTED Final   Enteropathogenic E coli (EPEC) NOT DETECTED NOT DETECTED Final   Enterotoxigenic E coli (ETEC) NOT DETECTED NOT DETECTED Final   Shiga like toxin producing E coli (STEC) NOT DETECTED NOT DETECTED Final   Shigella/Enteroinvasive E coli (EIEC) NOT DETECTED NOT DETECTED Final   Cryptosporidium NOT DETECTED NOT DETECTED Final   Cyclospora cayetanensis NOT DETECTED NOT DETECTED Final   Entamoeba histolytica NOT DETECTED NOT DETECTED Final   Giardia lamblia NOT DETECTED NOT DETECTED Final   Adenovirus F40/41 NOT DETECTED NOT DETECTED Final   Astrovirus NOT DETECTED NOT DETECTED Final   Norovirus GI/GII NOT DETECTED NOT  DETECTED Final   Rotavirus A NOT DETECTED NOT DETECTED Final   Sapovirus (I, II, IV, and V) NOT DETECTED NOT DETECTED Final    Comment: Performed at [facility], [address], [place], [place] [address]  C Difficile Quick Screen w PCR reflex     Status: None   Collection Time: 08/12/20  1:30 AM  Specimen: Stool  Result Value Ref Range Status   C Diff antigen NEGATIVE NEGATIVE Final   C Diff toxin NEGATIVE NEGATIVE Final   C Diff interpretation No C. difficile detected.  Final    Comment: Performed at [facility], [place] [address], [place], [place] [address]     Medications:    finasteride  5 mg Oral Daily   heparin  5,000 Units Subcutaneous Q8H   insulin aspart  0-6 Units Subcutaneous TID WC   levofloxacin  250 mg Oral Daily   psyllium  1 packet Oral Daily   tamsulosin  0.4 mg Oral BID   Continuous Infusions:  ondansetron (ZOFRAN) IV        LOS: 3 days   Charlynne Cousins  [facility]  08/15/2020, 8:15 AM

## 2020-08-19 DIAGNOSIS — N201 Calculus of ureter: Secondary | ICD-10-CM | POA: Diagnosis not present

## 2020-08-19 DIAGNOSIS — R3912 Poor urinary stream: Secondary | ICD-10-CM | POA: Diagnosis not present

## 2020-08-19 DIAGNOSIS — N5201 Erectile dysfunction due to arterial insufficiency: Secondary | ICD-10-CM | POA: Diagnosis not present

## 2020-08-31 DIAGNOSIS — R970 Elevated carcinoembryonic antigen [CEA]: Secondary | ICD-10-CM | POA: Diagnosis not present

## 2020-08-31 DIAGNOSIS — Z125 Encounter for screening for malignant neoplasm of prostate: Secondary | ICD-10-CM | POA: Diagnosis not present

## 2020-08-31 DIAGNOSIS — E1169 Type 2 diabetes mellitus with other specified complication: Secondary | ICD-10-CM | POA: Diagnosis not present

## 2020-08-31 DIAGNOSIS — E785 Hyperlipidemia, unspecified: Secondary | ICD-10-CM | POA: Diagnosis not present

## 2020-09-12 DIAGNOSIS — Z1331 Encounter for screening for depression: Secondary | ICD-10-CM | POA: Diagnosis not present

## 2020-09-12 DIAGNOSIS — E785 Hyperlipidemia, unspecified: Secondary | ICD-10-CM | POA: Diagnosis not present

## 2020-09-12 DIAGNOSIS — Z0000 Encounter for general adult medical examination without abnormal findings: Secondary | ICD-10-CM | POA: Diagnosis not present

## 2020-09-12 DIAGNOSIS — E1169 Type 2 diabetes mellitus with other specified complication: Secondary | ICD-10-CM | POA: Diagnosis not present

## 2020-09-12 DIAGNOSIS — K869 Disease of pancreas, unspecified: Secondary | ICD-10-CM | POA: Diagnosis not present

## 2020-09-12 DIAGNOSIS — Z1339 Encounter for screening examination for other mental health and behavioral disorders: Secondary | ICD-10-CM | POA: Diagnosis not present

## 2020-09-12 DIAGNOSIS — R970 Elevated carcinoembryonic antigen [CEA]: Secondary | ICD-10-CM | POA: Diagnosis not present

## 2020-09-12 DIAGNOSIS — Z23 Encounter for immunization: Secondary | ICD-10-CM | POA: Diagnosis not present

## 2020-09-12 DIAGNOSIS — R413 Other amnesia: Secondary | ICD-10-CM | POA: Diagnosis not present

## 2020-09-12 DIAGNOSIS — R82998 Other abnormal findings in urine: Secondary | ICD-10-CM | POA: Diagnosis not present

## 2020-09-12 DIAGNOSIS — K760 Fatty (change of) liver, not elsewhere classified: Secondary | ICD-10-CM | POA: Diagnosis not present

## 2020-09-12 DIAGNOSIS — D692 Other nonthrombocytopenic purpura: Secondary | ICD-10-CM | POA: Diagnosis not present

## 2020-09-12 DIAGNOSIS — B3749 Other urogenital candidiasis: Secondary | ICD-10-CM | POA: Diagnosis not present

## 2020-09-19 DIAGNOSIS — Z87891 Personal history of nicotine dependence: Secondary | ICD-10-CM | POA: Diagnosis not present

## 2020-09-19 DIAGNOSIS — Z980 Intestinal bypass and anastomosis status: Secondary | ICD-10-CM | POA: Diagnosis not present

## 2020-09-19 DIAGNOSIS — K2289 Other specified disease of esophagus: Secondary | ICD-10-CM | POA: Diagnosis not present

## 2020-09-19 DIAGNOSIS — K200 Eosinophilic esophagitis: Secondary | ICD-10-CM | POA: Diagnosis not present

## 2020-09-19 DIAGNOSIS — K22711 Barrett's esophagus with high grade dysplasia: Secondary | ICD-10-CM | POA: Diagnosis not present

## 2020-09-19 DIAGNOSIS — K208 Other esophagitis without bleeding: Secondary | ICD-10-CM | POA: Diagnosis not present

## 2020-09-27 DIAGNOSIS — N5201 Erectile dysfunction due to arterial insufficiency: Secondary | ICD-10-CM | POA: Diagnosis not present

## 2020-09-27 DIAGNOSIS — N201 Calculus of ureter: Secondary | ICD-10-CM | POA: Diagnosis not present

## 2020-09-27 DIAGNOSIS — R338 Other retention of urine: Secondary | ICD-10-CM | POA: Diagnosis not present

## 2020-09-27 DIAGNOSIS — R8271 Bacteriuria: Secondary | ICD-10-CM | POA: Diagnosis not present

## 2020-10-10 DIAGNOSIS — L570 Actinic keratosis: Secondary | ICD-10-CM | POA: Diagnosis not present

## 2020-10-10 DIAGNOSIS — L578 Other skin changes due to chronic exposure to nonionizing radiation: Secondary | ICD-10-CM | POA: Diagnosis not present

## 2020-10-10 DIAGNOSIS — L821 Other seborrheic keratosis: Secondary | ICD-10-CM | POA: Diagnosis not present

## 2020-10-17 ENCOUNTER — Ambulatory Visit (INDEPENDENT_AMBULATORY_CARE_PROVIDER_SITE_OTHER): Payer: PPO | Admitting: Podiatry

## 2020-10-17 ENCOUNTER — Other Ambulatory Visit: Payer: Self-pay

## 2020-10-17 ENCOUNTER — Encounter: Payer: Self-pay | Admitting: Podiatry

## 2020-10-17 DIAGNOSIS — M79675 Pain in left toe(s): Secondary | ICD-10-CM

## 2020-10-17 DIAGNOSIS — M79674 Pain in right toe(s): Secondary | ICD-10-CM | POA: Diagnosis not present

## 2020-10-17 DIAGNOSIS — S60519A Abrasion of unspecified hand, initial encounter: Secondary | ICD-10-CM | POA: Insufficient documentation

## 2020-10-17 DIAGNOSIS — B351 Tinea unguium: Secondary | ICD-10-CM

## 2020-10-17 DIAGNOSIS — E119 Type 2 diabetes mellitus without complications: Secondary | ICD-10-CM | POA: Diagnosis not present

## 2020-10-18 NOTE — Progress Notes (Signed)
Subjective: 87 y.o. returns the office today for painful, elongated, thickened toenails which he cannot trim himself.   No open lesions and he also denies any new concerns today.    Denies any systemic complaints such as fevers, chills, nausea, vomiting.   Recently was hospitalized and feels that he is still weak.  Overall improving.  PCP: Burnard Bunting, MD  Objective: AAO 3, NAD DP/PT pulses palpable, CRT less than 3 seconds Nails hypertrophic, dystrophic, elongated, brittle, discolored 10. There is tenderness overlying the nails 1-5 bilaterally. There is no surrounding erythema or drainage along the nail sites.  Dry skin present without any skin fissures or skin breakdown/drainage.  No open ulcerations or lesions. No pain with calf compression, warmth, erythema.  Assessment: Patient presents with symptomatic onychomycosis  Plan: -Treatment options including alternatives, risks, complications were discussed -Nails sharply debrided 10 without complication/bleeding. -Discussed daily foot inspection. If there are any changes, to call the office immediately.  -Follow-up in 3 months or as needed if any problems are to arise.   Celesta Gentile, DPM

## 2020-10-25 DIAGNOSIS — K862 Cyst of pancreas: Secondary | ICD-10-CM | POA: Diagnosis not present

## 2020-10-25 DIAGNOSIS — D490 Neoplasm of unspecified behavior of digestive system: Secondary | ICD-10-CM | POA: Diagnosis not present

## 2020-10-25 DIAGNOSIS — Z79899 Other long term (current) drug therapy: Secondary | ICD-10-CM | POA: Diagnosis not present

## 2020-10-25 DIAGNOSIS — K8689 Other specified diseases of pancreas: Secondary | ICD-10-CM | POA: Diagnosis not present

## 2020-11-16 DIAGNOSIS — R609 Edema, unspecified: Secondary | ICD-10-CM | POA: Diagnosis not present

## 2020-11-16 DIAGNOSIS — E785 Hyperlipidemia, unspecified: Secondary | ICD-10-CM | POA: Diagnosis not present

## 2020-11-16 DIAGNOSIS — K869 Disease of pancreas, unspecified: Secondary | ICD-10-CM | POA: Diagnosis not present

## 2020-11-16 DIAGNOSIS — E1169 Type 2 diabetes mellitus with other specified complication: Secondary | ICD-10-CM | POA: Diagnosis not present

## 2021-01-09 DIAGNOSIS — I872 Venous insufficiency (chronic) (peripheral): Secondary | ICD-10-CM | POA: Diagnosis not present

## 2021-01-09 DIAGNOSIS — E1169 Type 2 diabetes mellitus with other specified complication: Secondary | ICD-10-CM | POA: Diagnosis not present

## 2021-01-09 DIAGNOSIS — K219 Gastro-esophageal reflux disease without esophagitis: Secondary | ICD-10-CM | POA: Diagnosis not present

## 2021-01-09 DIAGNOSIS — D692 Other nonthrombocytopenic purpura: Secondary | ICD-10-CM | POA: Diagnosis not present

## 2021-01-09 DIAGNOSIS — R413 Other amnesia: Secondary | ICD-10-CM | POA: Diagnosis not present

## 2021-01-09 DIAGNOSIS — K869 Disease of pancreas, unspecified: Secondary | ICD-10-CM | POA: Diagnosis not present

## 2021-01-18 ENCOUNTER — Ambulatory Visit: Payer: PPO | Admitting: Podiatry

## 2021-01-19 ENCOUNTER — Ambulatory Visit: Payer: PPO | Admitting: Podiatry

## 2021-03-10 ENCOUNTER — Ambulatory Visit: Payer: PPO | Admitting: Podiatry

## 2021-03-10 ENCOUNTER — Other Ambulatory Visit: Payer: Self-pay

## 2021-03-10 DIAGNOSIS — M79674 Pain in right toe(s): Secondary | ICD-10-CM | POA: Diagnosis not present

## 2021-03-10 DIAGNOSIS — M79675 Pain in left toe(s): Secondary | ICD-10-CM | POA: Diagnosis not present

## 2021-03-10 DIAGNOSIS — E119 Type 2 diabetes mellitus without complications: Secondary | ICD-10-CM

## 2021-03-10 DIAGNOSIS — B351 Tinea unguium: Secondary | ICD-10-CM

## 2021-03-10 NOTE — Progress Notes (Signed)
Subjective: ?87y.o. returns the office today for painful, elongated, thickened toenails which he cannot trim himself.   No open lesions and he also denies any new concerns today.    Denies any systemic complaints such as fevers, chills, nausea, vomiting.  ? ?PCP: Burnard Bunting, MD ? ?Objective: ?AAO ?3, NAD ?DP/PT pulses palpable, CRT less than 3 seconds ?Nails hypertrophic, dystrophic, elongated, brittle, discolored ?10.  Incurvation of the hallux toenails.  There is tenderness overlying the nails 1-5 bilaterally. There is no surrounding erythema or drainage along the nail sites.  ?Minimal callus formation submetatarsal 2 area left foot without any underlying ulceration drainage or signs of infection. ?No pain with calf compression, warmth, erythema. ? ?Assessment: ?Patient presents with symptomatic onychomycosis ? ?Plan: ?-Treatment options including alternatives, risks, complications were discussed ?-Nails sharply debrided ?10.  Upon debriding the right medial nail border there was significant skin buildup.  Small amount of bleeding occurred during the debridement but there is no laceration noted.  Area was cleaned with alcohol and antibiotic ointment and a bandage was applied.  Monitor for any signs or symptoms of infection but go immediately should any occur. ?-Discussed daily foot inspection. If there are any changes, to call the office immediately.  ?-Follow-up in 3 months or as needed if any problems are to arise.  ? ?Celesta Gentile, DPM ? ?

## 2021-03-15 DIAGNOSIS — E1169 Type 2 diabetes mellitus with other specified complication: Secondary | ICD-10-CM | POA: Diagnosis not present

## 2021-03-15 DIAGNOSIS — E785 Hyperlipidemia, unspecified: Secondary | ICD-10-CM | POA: Diagnosis not present

## 2021-03-20 DIAGNOSIS — N400 Enlarged prostate without lower urinary tract symptoms: Secondary | ICD-10-CM | POA: Diagnosis not present

## 2021-03-20 DIAGNOSIS — K209 Esophagitis, unspecified without bleeding: Secondary | ICD-10-CM | POA: Diagnosis not present

## 2021-03-20 DIAGNOSIS — Z794 Long term (current) use of insulin: Secondary | ICD-10-CM | POA: Diagnosis not present

## 2021-03-20 DIAGNOSIS — Z980 Intestinal bypass and anastomosis status: Secondary | ICD-10-CM | POA: Diagnosis not present

## 2021-03-20 DIAGNOSIS — K2289 Other specified disease of esophagus: Secondary | ICD-10-CM | POA: Diagnosis not present

## 2021-03-20 DIAGNOSIS — K22711 Barrett's esophagus with high grade dysplasia: Secondary | ICD-10-CM | POA: Diagnosis not present

## 2021-03-20 DIAGNOSIS — Z79899 Other long term (current) drug therapy: Secondary | ICD-10-CM | POA: Diagnosis not present

## 2021-03-20 DIAGNOSIS — Z931 Gastrostomy status: Secondary | ICD-10-CM | POA: Diagnosis not present

## 2021-03-20 DIAGNOSIS — Z87898 Personal history of other specified conditions: Secondary | ICD-10-CM | POA: Diagnosis not present

## 2021-03-20 DIAGNOSIS — K2950 Unspecified chronic gastritis without bleeding: Secondary | ICD-10-CM | POA: Diagnosis not present

## 2021-03-20 DIAGNOSIS — E119 Type 2 diabetes mellitus without complications: Secondary | ICD-10-CM | POA: Diagnosis not present

## 2021-03-20 DIAGNOSIS — Z87891 Personal history of nicotine dependence: Secondary | ICD-10-CM | POA: Diagnosis not present

## 2021-03-20 DIAGNOSIS — Z8719 Personal history of other diseases of the digestive system: Secondary | ICD-10-CM | POA: Diagnosis not present

## 2021-03-20 DIAGNOSIS — Z9889 Other specified postprocedural states: Secondary | ICD-10-CM | POA: Diagnosis not present

## 2021-04-11 ENCOUNTER — Encounter: Payer: Self-pay | Admitting: Podiatry

## 2021-06-01 DIAGNOSIS — L821 Other seborrheic keratosis: Secondary | ICD-10-CM | POA: Diagnosis not present

## 2021-06-01 DIAGNOSIS — L578 Other skin changes due to chronic exposure to nonionizing radiation: Secondary | ICD-10-CM | POA: Diagnosis not present

## 2021-06-01 DIAGNOSIS — L570 Actinic keratosis: Secondary | ICD-10-CM | POA: Diagnosis not present

## 2021-06-01 DIAGNOSIS — D0422 Carcinoma in situ of skin of left ear and external auricular canal: Secondary | ICD-10-CM | POA: Diagnosis not present

## 2021-06-16 ENCOUNTER — Ambulatory Visit: Payer: PPO | Admitting: Podiatry

## 2021-06-16 DIAGNOSIS — M79674 Pain in right toe(s): Secondary | ICD-10-CM

## 2021-06-16 DIAGNOSIS — E119 Type 2 diabetes mellitus without complications: Secondary | ICD-10-CM | POA: Diagnosis not present

## 2021-06-16 DIAGNOSIS — M79675 Pain in left toe(s): Secondary | ICD-10-CM | POA: Diagnosis not present

## 2021-06-16 DIAGNOSIS — B351 Tinea unguium: Secondary | ICD-10-CM | POA: Diagnosis not present

## 2021-06-16 NOTE — Progress Notes (Signed)
Subjective: 88 y.o. returns the office today for painful, elongated, thickened toenails which he cannot trim himself.   No open lesions and he also denies any new concerns today.    Denies any systemic complaints such as fevers, chills, nausea, vomiting.   PCP: Burnard Bunting, MD  Objective: AAO 3, NAD DP/PT pulses palpable, CRT less than 3 seconds Nails hypertrophic, dystrophic, elongated, brittle, discolored 10.  Incurvation of the hallux toenails.  There is tenderness overlying the nails 1-5 bilaterally. There is no surrounding erythema or drainage along the nail sites.  No significant callus formation. No pain with calf compression, warmth, erythema.  Assessment: Patient presents with symptomatic onychomycosis  Plan: -Treatment options including alternatives, risks, complications were discussed -Nails sharply debrided 10 without any complications or bleeding.   -Monitor for any clinical signs or symptoms of infection and directed to call the office immediately should any occur or go to the ER. -Follow-up in 3 months or as needed if any problems are to arise.   Celesta Gentile, DPM

## 2021-06-19 DIAGNOSIS — K869 Disease of pancreas, unspecified: Secondary | ICD-10-CM | POA: Diagnosis not present

## 2021-06-19 DIAGNOSIS — E785 Hyperlipidemia, unspecified: Secondary | ICD-10-CM | POA: Diagnosis not present

## 2021-06-19 DIAGNOSIS — K760 Fatty (change of) liver, not elsewhere classified: Secondary | ICD-10-CM | POA: Diagnosis not present

## 2021-06-19 DIAGNOSIS — E1169 Type 2 diabetes mellitus with other specified complication: Secondary | ICD-10-CM | POA: Diagnosis not present

## 2021-06-19 DIAGNOSIS — K219 Gastro-esophageal reflux disease without esophagitis: Secondary | ICD-10-CM | POA: Diagnosis not present

## 2021-06-19 DIAGNOSIS — D692 Other nonthrombocytopenic purpura: Secondary | ICD-10-CM | POA: Diagnosis not present

## 2021-07-21 DIAGNOSIS — E1142 Type 2 diabetes mellitus with diabetic polyneuropathy: Secondary | ICD-10-CM | POA: Diagnosis not present

## 2021-07-21 DIAGNOSIS — Z87891 Personal history of nicotine dependence: Secondary | ICD-10-CM | POA: Diagnosis not present

## 2021-07-21 DIAGNOSIS — H9190 Unspecified hearing loss, unspecified ear: Secondary | ICD-10-CM | POA: Diagnosis not present

## 2021-07-21 DIAGNOSIS — I700 Atherosclerosis of aorta: Secondary | ICD-10-CM | POA: Diagnosis not present

## 2021-07-21 DIAGNOSIS — K869 Disease of pancreas, unspecified: Secondary | ICD-10-CM | POA: Diagnosis not present

## 2021-07-21 DIAGNOSIS — K219 Gastro-esophageal reflux disease without esophagitis: Secondary | ICD-10-CM | POA: Diagnosis not present

## 2021-07-21 DIAGNOSIS — N400 Enlarged prostate without lower urinary tract symptoms: Secondary | ICD-10-CM | POA: Diagnosis not present

## 2021-08-22 DIAGNOSIS — E785 Hyperlipidemia, unspecified: Secondary | ICD-10-CM | POA: Diagnosis not present

## 2021-08-22 DIAGNOSIS — E1169 Type 2 diabetes mellitus with other specified complication: Secondary | ICD-10-CM | POA: Diagnosis not present

## 2021-09-11 DIAGNOSIS — K2100 Gastro-esophageal reflux disease with esophagitis, without bleeding: Secondary | ICD-10-CM | POA: Diagnosis not present

## 2021-09-11 DIAGNOSIS — K209 Esophagitis, unspecified without bleeding: Secondary | ICD-10-CM | POA: Diagnosis not present

## 2021-09-11 DIAGNOSIS — N189 Chronic kidney disease, unspecified: Secondary | ICD-10-CM | POA: Diagnosis not present

## 2021-09-11 DIAGNOSIS — E1122 Type 2 diabetes mellitus with diabetic chronic kidney disease: Secondary | ICD-10-CM | POA: Diagnosis not present

## 2021-09-11 DIAGNOSIS — Z980 Intestinal bypass and anastomosis status: Secondary | ICD-10-CM | POA: Diagnosis not present

## 2021-09-11 DIAGNOSIS — Z8719 Personal history of other diseases of the digestive system: Secondary | ICD-10-CM | POA: Diagnosis not present

## 2021-09-11 DIAGNOSIS — Z09 Encounter for follow-up examination after completed treatment for conditions other than malignant neoplasm: Secondary | ICD-10-CM | POA: Diagnosis not present

## 2021-09-11 DIAGNOSIS — E119 Type 2 diabetes mellitus without complications: Secondary | ICD-10-CM | POA: Diagnosis not present

## 2021-09-11 DIAGNOSIS — E876 Hypokalemia: Secondary | ICD-10-CM | POA: Diagnosis not present

## 2021-09-11 DIAGNOSIS — Z794 Long term (current) use of insulin: Secondary | ICD-10-CM | POA: Diagnosis not present

## 2021-09-11 DIAGNOSIS — Z87891 Personal history of nicotine dependence: Secondary | ICD-10-CM | POA: Diagnosis not present

## 2021-09-11 DIAGNOSIS — J45909 Unspecified asthma, uncomplicated: Secondary | ICD-10-CM | POA: Diagnosis not present

## 2021-09-11 DIAGNOSIS — Z79899 Other long term (current) drug therapy: Secondary | ICD-10-CM | POA: Diagnosis not present

## 2021-09-11 DIAGNOSIS — K2950 Unspecified chronic gastritis without bleeding: Secondary | ICD-10-CM | POA: Diagnosis not present

## 2021-09-11 DIAGNOSIS — Z7982 Long term (current) use of aspirin: Secondary | ICD-10-CM | POA: Diagnosis not present

## 2021-09-11 DIAGNOSIS — K208 Other esophagitis without bleeding: Secondary | ICD-10-CM | POA: Diagnosis not present

## 2021-09-11 DIAGNOSIS — Z934 Other artificial openings of gastrointestinal tract status: Secondary | ICD-10-CM | POA: Diagnosis not present

## 2021-09-11 DIAGNOSIS — K259 Gastric ulcer, unspecified as acute or chronic, without hemorrhage or perforation: Secondary | ICD-10-CM | POA: Diagnosis not present

## 2021-09-19 DIAGNOSIS — L820 Inflamed seborrheic keratosis: Secondary | ICD-10-CM | POA: Diagnosis not present

## 2021-09-19 DIAGNOSIS — L821 Other seborrheic keratosis: Secondary | ICD-10-CM | POA: Diagnosis not present

## 2021-09-19 DIAGNOSIS — L570 Actinic keratosis: Secondary | ICD-10-CM | POA: Diagnosis not present

## 2021-09-19 DIAGNOSIS — L578 Other skin changes due to chronic exposure to nonionizing radiation: Secondary | ICD-10-CM | POA: Diagnosis not present

## 2021-09-19 DIAGNOSIS — D485 Neoplasm of uncertain behavior of skin: Secondary | ICD-10-CM | POA: Diagnosis not present

## 2021-09-26 DIAGNOSIS — M6281 Muscle weakness (generalized): Secondary | ICD-10-CM | POA: Diagnosis not present

## 2021-09-26 DIAGNOSIS — R260 Ataxic gait: Secondary | ICD-10-CM | POA: Diagnosis not present

## 2021-09-29 ENCOUNTER — Ambulatory Visit (INDEPENDENT_AMBULATORY_CARE_PROVIDER_SITE_OTHER): Payer: PPO | Admitting: Podiatry

## 2021-09-29 DIAGNOSIS — B351 Tinea unguium: Secondary | ICD-10-CM

## 2021-09-29 DIAGNOSIS — M79674 Pain in right toe(s): Secondary | ICD-10-CM

## 2021-09-29 DIAGNOSIS — M79675 Pain in left toe(s): Secondary | ICD-10-CM | POA: Diagnosis not present

## 2021-09-29 DIAGNOSIS — E119 Type 2 diabetes mellitus without complications: Secondary | ICD-10-CM

## 2021-09-30 NOTE — Progress Notes (Signed)
Subjective: 88 y.o. returns the office today for painful, elongated, thickened toenails which he cannot trim himself.   No open lesions and he also denies any new concerns today.    Denies any systemic complaints such as fevers, chills, nausea, vomiting.   PCP: Burnard Bunting, MD Last seen September 12, 2021  Objective: AAO 3, NAD DP/PT pulses palpable, CRT less than 3 seconds Nails hypertrophic, dystrophic, elongated, brittle, discolored 10.  Incurvation of the hallux toenails.  There is tenderness overlying the nails 1-5 bilaterally. There is no surrounding erythema or drainage along the nail sites.  No significant callus formation.  Dry skin present bilateral feet. No pain with calf compression, warmth, erythema.  Assessment: Patient presents with symptomatic onychomycosis  Plan: -Treatment options including alternatives, risks, complications were discussed -Nails sharply debrided 10 without any complications or bleeding.   -Moisturizer for the skin but do not apply interdigitally. -Monitor for any clinical signs or symptoms of infection and directed to call the office immediately should any occur or go to the ER. -Follow-up in 3 months or as needed if any problems are to arise.   Celesta Gentile, DPM

## 2021-10-17 DIAGNOSIS — N200 Calculus of kidney: Secondary | ICD-10-CM | POA: Diagnosis not present

## 2021-10-17 DIAGNOSIS — N281 Cyst of kidney, acquired: Secondary | ICD-10-CM | POA: Diagnosis not present

## 2021-10-23 DIAGNOSIS — E1169 Type 2 diabetes mellitus with other specified complication: Secondary | ICD-10-CM | POA: Diagnosis not present

## 2021-10-23 DIAGNOSIS — R7989 Other specified abnormal findings of blood chemistry: Secondary | ICD-10-CM | POA: Diagnosis not present

## 2021-10-23 DIAGNOSIS — Z125 Encounter for screening for malignant neoplasm of prostate: Secondary | ICD-10-CM | POA: Diagnosis not present

## 2021-10-23 DIAGNOSIS — E785 Hyperlipidemia, unspecified: Secondary | ICD-10-CM | POA: Diagnosis not present

## 2021-10-24 DIAGNOSIS — Z87891 Personal history of nicotine dependence: Secondary | ICD-10-CM | POA: Diagnosis not present

## 2021-10-24 DIAGNOSIS — K8689 Other specified diseases of pancreas: Secondary | ICD-10-CM | POA: Diagnosis not present

## 2021-10-24 DIAGNOSIS — D490 Neoplasm of unspecified behavior of digestive system: Secondary | ICD-10-CM | POA: Diagnosis not present

## 2021-10-24 DIAGNOSIS — K862 Cyst of pancreas: Secondary | ICD-10-CM | POA: Diagnosis not present

## 2021-10-24 DIAGNOSIS — K529 Noninfective gastroenteritis and colitis, unspecified: Secondary | ICD-10-CM | POA: Diagnosis not present

## 2021-10-30 DIAGNOSIS — Z23 Encounter for immunization: Secondary | ICD-10-CM | POA: Diagnosis not present

## 2021-10-30 DIAGNOSIS — Z1331 Encounter for screening for depression: Secondary | ICD-10-CM | POA: Diagnosis not present

## 2021-10-30 DIAGNOSIS — Z0000 Encounter for general adult medical examination without abnormal findings: Secondary | ICD-10-CM | POA: Diagnosis not present

## 2021-10-30 DIAGNOSIS — R269 Unspecified abnormalities of gait and mobility: Secondary | ICD-10-CM | POA: Diagnosis not present

## 2021-10-30 DIAGNOSIS — K869 Disease of pancreas, unspecified: Secondary | ICD-10-CM | POA: Diagnosis not present

## 2021-10-30 DIAGNOSIS — E1169 Type 2 diabetes mellitus with other specified complication: Secondary | ICD-10-CM | POA: Diagnosis not present

## 2021-10-30 DIAGNOSIS — R82998 Other abnormal findings in urine: Secondary | ICD-10-CM | POA: Diagnosis not present

## 2021-10-30 DIAGNOSIS — Z1339 Encounter for screening examination for other mental health and behavioral disorders: Secondary | ICD-10-CM | POA: Diagnosis not present

## 2021-12-20 DIAGNOSIS — E785 Hyperlipidemia, unspecified: Secondary | ICD-10-CM | POA: Diagnosis not present

## 2021-12-20 DIAGNOSIS — E1169 Type 2 diabetes mellitus with other specified complication: Secondary | ICD-10-CM | POA: Diagnosis not present

## 2022-01-05 ENCOUNTER — Ambulatory Visit (INDEPENDENT_AMBULATORY_CARE_PROVIDER_SITE_OTHER): Payer: PPO | Admitting: Podiatry

## 2022-01-05 VITALS — BP 136/75 | HR 61

## 2022-01-05 DIAGNOSIS — E119 Type 2 diabetes mellitus without complications: Secondary | ICD-10-CM

## 2022-01-05 DIAGNOSIS — L600 Ingrowing nail: Secondary | ICD-10-CM

## 2022-01-05 DIAGNOSIS — B351 Tinea unguium: Secondary | ICD-10-CM

## 2022-01-05 DIAGNOSIS — M79674 Pain in right toe(s): Secondary | ICD-10-CM

## 2022-01-05 DIAGNOSIS — M79675 Pain in left toe(s): Secondary | ICD-10-CM

## 2022-01-05 NOTE — Progress Notes (Unsigned)
Subjective: Chief Complaint  Patient presents with   Diabetes    Diabetic foot care, A1c-6.7 BG- not taking, nail trim     89 y.o. returns the office today for painful, elongated, thickened toenails which he cannot trim himself.  The right medial hallux has been bleeding intermittently.   Denies any systemic complaints such as fevers, chills, nausea, vomiting.   PCP: Burnard Bunting, MD Last seen September 12, 2021  Objective: AAO 3, NAD DP/PT pulses palpable, CRT less than 3 seconds Nails hypertrophic, dystrophic, elongated, brittle, discolored 10.  Incurvation of the hallux toenails.  There is tenderness overlying the nails 1-5 bilaterally. There is no surrounding erythema or drainage along the nail sites.  No significant callus formation.  Dry skin present bilateral feet. No pain with calf compression, warmth, erythema.  Assessment: Patient presents with symptomatic onychomycosis  Plan: -Treatment options including alternatives, risks, complications were discussed -Nails sharply debrided 10 without any complications or bleeding.   -Possible AP nail nail right medial hallux next apt. -Moisturizer for the skin but do not apply interdigitally. -Monitor for any clinical signs or symptoms of infection and directed to call the office immediately should any occur or go to the ER. -Follow-up in 3 months or as needed if any problems are to arise.   Celesta Gentile, DPM

## 2022-01-05 NOTE — Patient Instructions (Signed)

## 2022-02-26 DIAGNOSIS — R269 Unspecified abnormalities of gait and mobility: Secondary | ICD-10-CM | POA: Diagnosis not present

## 2022-02-26 DIAGNOSIS — I872 Venous insufficiency (chronic) (peripheral): Secondary | ICD-10-CM | POA: Diagnosis not present

## 2022-02-26 DIAGNOSIS — E1169 Type 2 diabetes mellitus with other specified complication: Secondary | ICD-10-CM | POA: Diagnosis not present

## 2022-02-26 DIAGNOSIS — K869 Disease of pancreas, unspecified: Secondary | ICD-10-CM | POA: Diagnosis not present

## 2022-03-26 DIAGNOSIS — L570 Actinic keratosis: Secondary | ICD-10-CM | POA: Diagnosis not present

## 2022-03-26 DIAGNOSIS — L821 Other seborrheic keratosis: Secondary | ICD-10-CM | POA: Diagnosis not present

## 2022-03-26 DIAGNOSIS — L578 Other skin changes due to chronic exposure to nonionizing radiation: Secondary | ICD-10-CM | POA: Diagnosis not present

## 2022-03-26 DIAGNOSIS — R600 Localized edema: Secondary | ICD-10-CM | POA: Diagnosis not present

## 2022-04-06 ENCOUNTER — Ambulatory Visit (INDEPENDENT_AMBULATORY_CARE_PROVIDER_SITE_OTHER): Payer: PPO | Admitting: Podiatry

## 2022-04-06 DIAGNOSIS — M79674 Pain in right toe(s): Secondary | ICD-10-CM | POA: Diagnosis not present

## 2022-04-06 DIAGNOSIS — M79675 Pain in left toe(s): Secondary | ICD-10-CM

## 2022-04-06 DIAGNOSIS — E119 Type 2 diabetes mellitus without complications: Secondary | ICD-10-CM

## 2022-04-06 DIAGNOSIS — B351 Tinea unguium: Secondary | ICD-10-CM | POA: Diagnosis not present

## 2022-04-07 NOTE — Progress Notes (Signed)
Subjective: Chief Complaint  Patient presents with   Nail Problem    Thick painful toenails, 3 month follow up    89 y.o. returns the office today for painful, elongated, thickened toenails which he cannot trim himself.  States that the right big toenail where he had ingrown toenail is doing much better.  No swelling or redness or any drainage.  Denies any systemic complaints such as fevers, chills, nausea, vomiting.   PCP: Geoffry Paradise, MD Last seen September 12, 2021  Objective: AAO 3, NAD DP/PT pulses palpable, CRT less than 3 seconds Nails hypertrophic, dystrophic, elongated, brittle, discolored 10.  Incurvation of the hallux toenails.  There is tenderness overlying the nails 1-5 bilaterally. There is no surrounding erythema or drainage along the nail sites.  Incurvation present along the right lateral nail border.  Limited, erythema or signs of infection. No significant callus formation.  Dry skin present bilateral feet. No pain with calf compression, warmth, erythema.  Assessment: Patient presents with symptomatic onychomycosis  Plan: -Treatment options including alternatives, risks, complications were discussed -Nails sharply debrided 10 without any complications or bleeding.   -Monitor for any clinical signs or symptoms of infection and directed to call the office immediately should any occur or go to the ER.  Return in about 3 months (around 07/06/2022) for routine care.  Ovid Curd, DPM

## 2022-04-18 DIAGNOSIS — E1169 Type 2 diabetes mellitus with other specified complication: Secondary | ICD-10-CM | POA: Diagnosis not present

## 2022-04-18 DIAGNOSIS — E785 Hyperlipidemia, unspecified: Secondary | ICD-10-CM | POA: Diagnosis not present

## 2022-04-18 DIAGNOSIS — K760 Fatty (change of) liver, not elsewhere classified: Secondary | ICD-10-CM | POA: Diagnosis not present

## 2022-04-23 DIAGNOSIS — R338 Other retention of urine: Secondary | ICD-10-CM | POA: Diagnosis not present

## 2022-04-23 DIAGNOSIS — N401 Enlarged prostate with lower urinary tract symptoms: Secondary | ICD-10-CM | POA: Diagnosis not present

## 2022-05-08 DIAGNOSIS — N200 Calculus of kidney: Secondary | ICD-10-CM | POA: Diagnosis not present

## 2022-05-08 DIAGNOSIS — R338 Other retention of urine: Secondary | ICD-10-CM | POA: Diagnosis not present

## 2022-05-08 DIAGNOSIS — N281 Cyst of kidney, acquired: Secondary | ICD-10-CM | POA: Diagnosis not present

## 2022-05-08 DIAGNOSIS — N5201 Erectile dysfunction due to arterial insufficiency: Secondary | ICD-10-CM | POA: Diagnosis not present

## 2022-05-09 ENCOUNTER — Other Ambulatory Visit: Payer: Self-pay | Admitting: Urology

## 2022-05-22 DIAGNOSIS — R338 Other retention of urine: Secondary | ICD-10-CM | POA: Diagnosis not present

## 2022-05-30 ENCOUNTER — Encounter (HOSPITAL_COMMUNITY): Payer: Self-pay

## 2022-05-31 DIAGNOSIS — N200 Calculus of kidney: Secondary | ICD-10-CM | POA: Diagnosis not present

## 2022-05-31 DIAGNOSIS — N3289 Other specified disorders of bladder: Secondary | ICD-10-CM | POA: Diagnosis not present

## 2022-06-05 NOTE — Patient Instructions (Addendum)
SURGICAL WAITING ROOM VISITATION Patients having surgery or a procedure may have no more than 2 support people in the waiting area - these visitors may rotate in the visitor waiting room.   Due to an increase in RSV and influenza rates and associated hospitalizations, children ages 77 and under may not visit patients in [facility] hospitals. If the patient needs to stay at the hospital during part of their recovery, the visitor guidelines for inpatient rooms apply.  PRE-OP VISITATION  Pre-op nurse will coordinate an appropriate time for 1 support person to accompany the patient in pre-op.  This support person may not rotate.  This visitor will be contacted when the time is appropriate for the visitor to come back in the pre-op area.  Please refer to the [facility] website for the visitor guidelines for Inpatients (after your surgery is over and you are in a regular room).  You are not required to quarantine at this time prior to your surgery. However, you must do this: Hand Hygiene often Do NOT share personal items Notify your provider if you are in close contact with someone who has COVID or you develop fever 100.4 or greater, new onset of sneezing, cough, sore throat, shortness of breath or body aches.  If you test positive for Covid or have been in contact with anyone that has tested positive in the last 10 days please notify you surgeon.    Your procedure is scheduled on: Friday   June 15, 2022   Report to [facility] Main Entrance: [place] entrance where the [employer] is available.   Report to admitting at:  05:15     AM  +++++Call this number if you have any questions or problems the morning of surgery [phone]  DO NOT EAT OR DRINK ANYTHING AFTER MIDNIGHT THE NIGHT PRIOR TO YOUR SURGERY / PROCEDURE.   FOLLOW BOWEL PREP AND ANY ADDITIONAL PRE OP INSTRUCTIONS YOU RECEIVED FROM YOUR SURGEON'S OFFICE!!!  Magnesium Citrate-  Drink 1 bottle before noon the day before  your surgery.     Oral Hygiene is also important to reduce your risk of infection.        Remember - BRUSH YOUR TEETH THE MORNING OF SURGERY WITH YOUR REGULAR TOOTHPASTE  Do NOT smoke after Midnight the night before surgery.  Take ONLY these medicines the morning of surgery with A SIP OF WATER: esomeprazole, Tamsulosin, finasteride, Bactrim DS (antibiotic that you just start taking 2 days before surgery)   You may not have any metal on your body including jewelry, and body piercing  Do not wear lotions, powders, cologne, or deodorant  Men may shave face and neck.  Contacts, Hearing Aids, dentures or bridgework may not be worn into surgery. DENTURES WILL BE REMOVED PRIOR TO SURGERY PLEASE DO NOT APPLY "Poly grip" OR ADHESIVES!!!  You may bring a small overnight bag with you on the day of surgery, only pack items that are not valuable. [facility] IS NOT RESPONSIBLE   FOR VALUABLES THAT ARE LOST OR STOLEN.    Do not bring your home medications to the hospital. The Pharmacy will dispense medications listed on your medication list to you during your admission in the Hospital.  Special Instructions: Bring a copy of your healthcare power of attorney and living will documents the day of surgery, if you wish to have them scanned into your [facility] Records- EPIC  Please read over the following fact sheets you were given: IF YOU HAVE QUESTIONS  ABOUT YOUR PRE-OP INSTRUCTIONS, PLEASE CALL [phone].    - Preparing for Surgery Before surgery, you can play an important role.  Because skin is not sterile, your skin needs to be as free of germs as possible.  You can reduce the number of germs on your skin by washing with CHG (chlorahexidine gluconate) soap before surgery.  CHG is an antiseptic cleaner which kills germs and bonds with the skin to continue killing germs even after washing. Please DO NOT use if you have an allergy to CHG or antibacterial soaps.  If your skin  becomes reddened/irritated stop using the CHG and inform your nurse when you arrive at Short Stay. Do not shave (including legs and underarms) for at least 48 hours prior to the first CHG shower.  You may shave your face/neck.  Please follow these instructions carefully:  1.  Shower with CHG Soap the night before surgery and the  morning of surgery.  2.  If you choose to wash your hair, wash your hair first as usual with your normal  shampoo.  3.  After you shampoo, rinse your hair and body thoroughly to remove the shampoo.                             4.  Use CHG as you would any other liquid soap.  You can apply chg directly to the skin and wash.  Gently with a scrungie or clean washcloth.  5.  Apply the CHG Soap to your body ONLY FROM THE NECK DOWN.   Do not use on face/ open                           Wound or open sores. Avoid contact with eyes, ears mouth and genitals (private parts).                       Wash face,  Genitals (private parts) with your normal soap.             6.  Wash thoroughly, paying special attention to the area where your  surgery  will be performed.  7.  Thoroughly rinse your body with warm water from the neck down.  8.  DO NOT shower/wash with your normal soap after using and rinsing off the CHG Soap.            9.  Pat yourself dry with a clean towel.            10.  Wear clean pajamas.            11.  Place clean sheets on your bed the night of your first shower and do not  sleep with pets.  ON THE DAY OF SURGERY : Do not apply any lotions/deodorants the morning of surgery.  Please wear clean clothes to the hospital/surgery center.    FAILURE TO FOLLOW THESE INSTRUCTIONS MAY RESULT IN THE CANCELLATION OF YOUR SURGERY  PATIENT SIGNATURE_________________________________  NURSE SIGNATURE__________________________________  ________________________________________________________________________

## 2022-06-05 NOTE — Progress Notes (Signed)
COVID Vaccine received:  []  No [x]  Yes Date of any COVID positive Test in last 90 days:  PCP - Geoffry Paradise, MD [phone] Cardiologist -   Chest x-ray - 08-11-2020  2v  Epic EKG -  08-17-2020  Epic Stress Test -  ECHO -  Cardiac Cath -   PCR screen: []  Ordered & Completed           []   No Order but Needs PROFEND           [x]   N/A for this surgery  Surgery Plan:  []  Ambulatory                            []  Outpatient in bed                            [x]  Admit  Anesthesia:    [x]  General  []  Spinal                           []   Choice []   MAC  Bowel Prep - []  No  [x]   Yes __mag. Citrate_1 bottle at [facility] the day before___  Pacemaker / ICD device [x]  No []  Yes   Spinal Cord Stimulator:[x]  No []  Yes       History of Sleep Apnea? [x]  No []  Yes   CPAP used?- [x]  No []  Yes    Does the patient monitor blood sugar?          []  No []  Yes  []  N/A Last A1c: 08-18-2019  ? 8.3 Patient has: []  NO Hx DM   []  Pre-DM                 []  DM1  [x]   DM2 Does patient have a [employer] or Dexacom? []  No []  Yes   Fasting Blood Sugar Ranges-  Checks Blood Sugar _____ times a day  SGLT-2 inhibitors / usual dose - Jardiance 5mg  per day SGLT-2 instructions: Hold x 72 Hours   Blood Thinner / Instructions: none Aspirin Instructions: none  ERAS Protocol Ordered: [x]  No  []  Yes Patient is to be NPO after: midnight prior  Comments:   Activity level: Patient is able / unable to climb a flight of stairs without difficulty; []  No CP  []  No SOB, but would have ___   Patient can / can not perform ADLs without assistance.   Anesthesia review: DM2, HOH, s/p Whipple surgery 2003, Fatty Liver, GERD  Patient denies shortness of breath, fever, cough and chest pain at PAT appointment.  Patient verbalized understanding and agreement to the Pre-Surgical Instructions that were given to them at this PAT appointment. Patient was also educated of the need to review these PAT instructions again prior  to his surgery.I reviewed the appropriate phone numbers to call if they have any and questions or concerns.

## 2022-06-07 ENCOUNTER — Other Ambulatory Visit: Payer: Self-pay

## 2022-06-07 ENCOUNTER — Encounter (HOSPITAL_COMMUNITY)
Admission: RE | Admit: 2022-06-07 | Discharge: 2022-06-07 | Disposition: A | Discharge status: Home / Self Care | Payer: PPO | Source: Ambulatory Visit | Attending: Urology | Admitting: Urology

## 2022-06-07 ENCOUNTER — Encounter (HOSPITAL_COMMUNITY): Payer: Self-pay

## 2022-06-07 VITALS — BP 132/64 | HR 52 | Temp 97.8°F | Resp 18 | Ht 68.0 in | Wt 130.0 lb

## 2022-06-07 DIAGNOSIS — Z01812 Encounter for preprocedural laboratory examination: Secondary | ICD-10-CM | POA: Diagnosis not present

## 2022-06-07 DIAGNOSIS — R001 Bradycardia, unspecified: Secondary | ICD-10-CM | POA: Diagnosis not present

## 2022-06-07 DIAGNOSIS — K760 Fatty (change of) liver, not elsewhere classified: Secondary | ICD-10-CM | POA: Insufficient documentation

## 2022-06-07 DIAGNOSIS — E119 Type 2 diabetes mellitus without complications: Secondary | ICD-10-CM | POA: Diagnosis not present

## 2022-06-07 DIAGNOSIS — I498 Other specified cardiac arrhythmias: Secondary | ICD-10-CM | POA: Insufficient documentation

## 2022-06-07 HISTORY — DX: Unspecified osteoarthritis, unspecified site: M19.90

## 2022-06-07 HISTORY — DX: Pneumonia, unspecified organism: J18.9

## 2022-06-07 HISTORY — DX: Personal history of urinary calculi: Z87.442

## 2022-06-07 LAB — CBC
HCT: 41 % (ref 39.0–52.0)
Hemoglobin: 12.8 g/dL — ABNORMAL LOW (ref 13.0–17.0)
MCH: 28.9 pg (ref 26.0–34.0)
MCHC: 31.2 g/dL (ref 30.0–36.0)
MCV: 92.6 fL (ref 80.0–100.0)
Platelets: 226 10*3/uL (ref 150–400)
RBC: 4.43 MIL/uL (ref 4.22–5.81)
RDW: 14.6 % (ref 11.5–15.5)
WBC: 5.6 10*3/uL (ref 4.0–10.5)
nRBC: 0 % (ref 0.0–0.2)

## 2022-06-07 LAB — COMPREHENSIVE METABOLIC PANEL
ALT: 21 U/L (ref 0–44)
AST: 25 U/L (ref 15–41)
Albumin: 2.7 g/dL — ABNORMAL LOW (ref 3.5–5.0)
Alkaline Phosphatase: 154 U/L — ABNORMAL HIGH (ref 38–126)
Anion gap: 6 (ref 5–15)
BUN: 13 mg/dL (ref 8–23)
CO2: 23 mmol/L (ref 22–32)
Calcium: 8.1 mg/dL — ABNORMAL LOW (ref 8.9–10.3)
Chloride: 103 mmol/L (ref 98–111)
Creatinine, Ser: 0.85 mg/dL (ref 0.61–1.24)
GFR, Estimated: >60 mL/min (ref >60)
Glucose, Bld: 103 mg/dL — ABNORMAL HIGH (ref 70–99)
Potassium: 3.5 mmol/L (ref 3.5–5.1)
Sodium: 132 mmol/L — ABNORMAL LOW (ref 135–145)
Total Bilirubin: 0.3 mg/dL (ref 0.3–1.2)
Total Protein: 5.9 g/dL — ABNORMAL LOW (ref 6.5–8.1)

## 2022-06-07 LAB — GLUCOSE, CAPILLARY: Glucose-Capillary: 84 mg/dL (ref 70–99)

## 2022-06-07 LAB — HEMOGLOBIN A1C
Hgb A1c MFr Bld: 6.6 % — ABNORMAL HIGH (ref 4.8–5.6)
Mean Plasma Glucose: 142.72 mg/dL

## 2022-06-14 ENCOUNTER — Encounter (HOSPITAL_COMMUNITY): Payer: Self-pay | Admitting: Urology

## 2022-06-14 NOTE — Anesthesia Preprocedure Evaluation (Addendum)
Anesthesia Evaluation  Patient identified by MRN, date of birth, ID band Patient awake    Reviewed: Allergy & Precautions, NPO status , Patient's Chart, lab work & pertinent test results  Airway Mallampati: II  TM Distance: >3 FB     Dental  (+) Poor Dentition, Missing, Chipped, Dental Advisory Given, Partial Upper,    Pulmonary asthma , pneumonia, resolved, former smoker   breath sounds clear to auscultation + decreased breath sounds      Cardiovascular negative cardio ROS  Rhythm:Regular Rate:Bradycardia  EKG 06/07/22 SB rate 48/min, 1st deg AV block   Neuro/Psych Bilateral sensorineural hearing loss  negative psych ROS   GI/Hepatic ,GERD  Medicated,,Hepatic steatosis Hx/o elevated CEA   Endo/Other  diabetes, Well Controlled, Type 2, Oral Hypoglycemic Agents  Hyperlipidemia  Renal/GU Renal diseaseHx/o renal calculi   BPH  Urinary retention Indwelling Foley catheter Elevated PSA    Musculoskeletal  (+) Arthritis , Osteoarthritis,    Abdominal Normal abdominal exam  (+)   Peds  Hematology negative hematology ROS (+)   Anesthesia Other Findings   Reproductive/Obstetrics ED                             Anesthesia Physical Anesthesia Plan  ASA: 3  Anesthesia Plan: General   Post-op Pain Management: Minimal or no pain anticipated   Induction: Intravenous and Cricoid pressure planned  PONV Risk Score and Plan: 4 or greater and Treatment may vary due to age or medical condition and Ondansetron  Airway Management Planned: Oral ETT  Additional Equipment: None  Intra-op Plan:   Post-operative Plan: Extubation in OR  Informed Consent: I have reviewed the patients History and Physical, chart, labs and discussed the procedure including the risks, benefits and alternatives for the proposed anesthesia with the patient or authorized representative who has indicated his/her understanding  and acceptance.     Dental advisory given  Plan Discussed with: CRNA and Anesthesiologist  Anesthesia Plan Comments:         Anesthesia Quick Evaluation

## 2022-06-15 ENCOUNTER — Inpatient Hospital Stay (HOSPITAL_COMMUNITY): Payer: PPO | Admitting: Anesthesiology

## 2022-06-15 ENCOUNTER — Encounter (HOSPITAL_COMMUNITY): Payer: Self-pay | Admitting: Urology

## 2022-06-15 ENCOUNTER — Other Ambulatory Visit: Payer: Self-pay

## 2022-06-15 ENCOUNTER — Encounter (HOSPITAL_COMMUNITY)
Admission: RE | Disposition: A | Discharge status: Home / Self Care | Payer: Self-pay | Source: Home / Self Care | Attending: Urology

## 2022-06-15 ENCOUNTER — Inpatient Hospital Stay (HOSPITAL_COMMUNITY)
Admission: RE | Admit: 2022-06-15 | Discharge: 2022-06-17 | DRG: 718 | Disposition: A | Discharge status: Home / Self Care | Payer: PPO | Attending: Urology | Admitting: Urology

## 2022-06-15 ENCOUNTER — Inpatient Hospital Stay (HOSPITAL_COMMUNITY): Payer: PPO | Admitting: Physician Assistant

## 2022-06-15 DIAGNOSIS — N401 Enlarged prostate with lower urinary tract symptoms: Principal | ICD-10-CM | POA: Diagnosis present

## 2022-06-15 DIAGNOSIS — R338 Other retention of urine: Secondary | ICD-10-CM | POA: Diagnosis present

## 2022-06-15 DIAGNOSIS — Z9049 Acquired absence of other specified parts of digestive tract: Secondary | ICD-10-CM

## 2022-06-15 DIAGNOSIS — E785 Hyperlipidemia, unspecified: Secondary | ICD-10-CM | POA: Diagnosis present

## 2022-06-15 DIAGNOSIS — E119 Type 2 diabetes mellitus without complications: Secondary | ICD-10-CM

## 2022-06-15 DIAGNOSIS — Z7984 Long term (current) use of oral hypoglycemic drugs: Secondary | ICD-10-CM | POA: Diagnosis not present

## 2022-06-15 DIAGNOSIS — Z87442 Personal history of urinary calculi: Secondary | ICD-10-CM

## 2022-06-15 DIAGNOSIS — K219 Gastro-esophageal reflux disease without esophagitis: Secondary | ICD-10-CM | POA: Diagnosis not present

## 2022-06-15 DIAGNOSIS — M1990 Unspecified osteoarthritis, unspecified site: Secondary | ICD-10-CM | POA: Diagnosis not present

## 2022-06-15 DIAGNOSIS — R54 Age-related physical debility: Secondary | ICD-10-CM | POA: Diagnosis present

## 2022-06-15 DIAGNOSIS — Z79899 Other long term (current) drug therapy: Secondary | ICD-10-CM

## 2022-06-15 DIAGNOSIS — N400 Enlarged prostate without lower urinary tract symptoms: Secondary | ICD-10-CM | POA: Diagnosis not present

## 2022-06-15 DIAGNOSIS — J45909 Unspecified asthma, uncomplicated: Secondary | ICD-10-CM | POA: Diagnosis not present

## 2022-06-15 DIAGNOSIS — Z833 Family history of diabetes mellitus: Secondary | ICD-10-CM | POA: Diagnosis not present

## 2022-06-15 DIAGNOSIS — K760 Fatty (change of) liver, not elsewhere classified: Secondary | ICD-10-CM | POA: Diagnosis not present

## 2022-06-15 DIAGNOSIS — Z87891 Personal history of nicotine dependence: Secondary | ICD-10-CM | POA: Diagnosis not present

## 2022-06-15 DIAGNOSIS — J309 Allergic rhinitis, unspecified: Secondary | ICD-10-CM | POA: Diagnosis present

## 2022-06-15 DIAGNOSIS — N138 Other obstructive and reflux uropathy: Principal | ICD-10-CM | POA: Diagnosis present

## 2022-06-15 DIAGNOSIS — C61 Malignant neoplasm of prostate: Secondary | ICD-10-CM | POA: Diagnosis not present

## 2022-06-15 DIAGNOSIS — Z888 Allergy status to other drugs, medicaments and biological substances status: Secondary | ICD-10-CM | POA: Diagnosis not present

## 2022-06-15 DIAGNOSIS — Z960 Presence of urogenital implants: Secondary | ICD-10-CM | POA: Diagnosis not present

## 2022-06-15 DIAGNOSIS — K660 Peritoneal adhesions (postprocedural) (postinfection): Secondary | ICD-10-CM | POA: Diagnosis not present

## 2022-06-15 HISTORY — PX: XI ROBOTIC ASSISTED SIMPLE PROSTATECTOMY: SHX6713

## 2022-06-15 LAB — TYPE AND SCREEN
ABO/RH(D): O POS
Antibody Screen: NEGATIVE

## 2022-06-15 LAB — GLUCOSE, CAPILLARY
Glucose-Capillary: 72 mg/dL (ref 70–99)
Glucose-Capillary: 143 mg/dL — ABNORMAL HIGH (ref 70–99)
Glucose-Capillary: 152 mg/dL — ABNORMAL HIGH (ref 70–99)
Glucose-Capillary: 213 mg/dL — ABNORMAL HIGH (ref 70–99)

## 2022-06-15 LAB — HEMOGLOBIN AND HEMATOCRIT, BLOOD
HCT: 38.8 % — ABNORMAL LOW (ref 39.0–52.0)
Hemoglobin: 12.5 g/dL — ABNORMAL LOW (ref 13.0–17.0)

## 2022-06-15 LAB — ABO/RH: ABO/RH(D): O POS

## 2022-06-15 SURGERY — PROSTATECTOMY, SIMPLE, ROBOT-ASSISTED
Anesthesia: General | Site: Abdomen

## 2022-06-15 MED ORDER — ACETAMINOPHEN 500 MG PO TABS
1000.0000 mg | ORAL_TABLET | Freq: Four times a day (QID) | ORAL | Status: AC
  Administered 2022-06-15 – 2022-06-16 (×2): 1000 mg via ORAL
  Filled 2022-06-15 (×2): qty 2

## 2022-06-15 MED ORDER — HYDROMORPHONE HCL 1 MG/ML IJ SOLN
INTRAMUSCULAR | Status: AC
  Filled 2022-06-15: qty 1

## 2022-06-15 MED ORDER — LACTATED RINGERS IR SOLN
Status: DC | PRN
  Administered 2022-06-15: 1000 mL

## 2022-06-15 MED ORDER — ACETAMINOPHEN 10 MG/ML IV SOLN
INTRAVENOUS | Status: AC
  Administered 2022-06-15: 1000 mg via INTRAVENOUS
  Filled 2022-06-15: qty 100

## 2022-06-15 MED ORDER — PROPOFOL 10 MG/ML IV BOLUS
INTRAVENOUS | Status: DC | PRN
  Administered 2022-06-15: 120 mg via INTRAVENOUS

## 2022-06-15 MED ORDER — PANCRELIPASE (LIP-PROT-AMYL) 12000-38000 UNITS PO CPEP
12000.0000 [IU] | ORAL_CAPSULE | Freq: Three times a day (TID) | ORAL | Status: DC
  Administered 2022-06-15 – 2022-06-17 (×6): 12000 [IU] via ORAL
  Filled 2022-06-15 (×6): qty 1

## 2022-06-15 MED ORDER — EPHEDRINE SULFATE-NACL 50-0.9 MG/10ML-% IV SOSY
PREFILLED_SYRINGE | INTRAVENOUS | Status: DC | PRN
  Administered 2022-06-15 (×2): 5 mg via INTRAVENOUS

## 2022-06-15 MED ORDER — FENTANYL CITRATE (PF) 100 MCG/2ML IJ SOLN
INTRAMUSCULAR | Status: AC
  Filled 2022-06-15: qty 2

## 2022-06-15 MED ORDER — SODIUM CHLORIDE (PF) 0.9 % IJ SOLN
INTRAMUSCULAR | Status: DC | PRN
  Administered 2022-06-15: 20 mL

## 2022-06-15 MED ORDER — DOCUSATE SODIUM 100 MG PO CAPS: 100.0000 mg | ORAL_CAPSULE | Freq: Two times a day (BID) | ORAL | Status: DC

## 2022-06-15 MED ORDER — ONDANSETRON HCL 4 MG/2ML IJ SOLN: 4.0000 mg | INTRAMUSCULAR | Status: DC | PRN

## 2022-06-15 MED ORDER — PANCRELIPASE (LIP-PROT-AMYL) 3000-9500 UNITS PO CPEP: 2.0000 | ORAL_CAPSULE | Freq: Three times a day (TID) | ORAL | Status: DC

## 2022-06-15 MED ORDER — ROCURONIUM BROMIDE 10 MG/ML (PF) SYRINGE
PREFILLED_SYRINGE | INTRAVENOUS | Status: AC
  Filled 2022-06-15: qty 10

## 2022-06-15 MED ORDER — DOCUSATE SODIUM 100 MG PO CAPS
100.0000 mg | ORAL_CAPSULE | Freq: Two times a day (BID) | ORAL | Status: DC
  Administered 2022-06-15 – 2022-06-17 (×4): 100 mg via ORAL
  Filled 2022-06-15 (×4): qty 1

## 2022-06-15 MED ORDER — SULFAMETHOXAZOLE-TRIMETHOPRIM 800-160 MG PO TABS: 1.0000 | ORAL_TABLET | Freq: Two times a day (BID) | ORAL | 0 refills | Status: DC

## 2022-06-15 MED ORDER — DEXMEDETOMIDINE HCL IN NACL 80 MCG/20ML IV SOLN
INTRAVENOUS | Status: DC | PRN
  Administered 2022-06-15: 8 ug via INTRAVENOUS

## 2022-06-15 MED ORDER — PHENYLEPHRINE 80 MCG/ML (10ML) SYRINGE FOR IV PUSH (FOR BLOOD PRESSURE SUPPORT)
PREFILLED_SYRINGE | INTRAVENOUS | Status: AC
  Filled 2022-06-15: qty 10

## 2022-06-15 MED ORDER — ORAL CARE MOUTH RINSE: 15.0000 mL | OROMUCOSAL | Status: DC | PRN

## 2022-06-15 MED ORDER — AMISULPRIDE (ANTIEMETIC) 5 MG/2ML IV SOLN: 10.0000 mg | Freq: Once | INTRAVENOUS | Status: DC | PRN

## 2022-06-15 MED ORDER — BACITRACIN ZINC 500 UNIT/GM EX OINT
TOPICAL_OINTMENT | CUTANEOUS | Status: AC
  Filled 2022-06-15: qty 28.35

## 2022-06-15 MED ORDER — SUGAMMADEX SODIUM 200 MG/2ML IV SOLN
INTRAVENOUS | Status: DC | PRN
  Administered 2022-06-15: 200 mg via INTRAVENOUS

## 2022-06-15 MED ORDER — MAGNESIUM CITRATE PO SOLN: 1.0000 | Freq: Once | ORAL | Status: DC

## 2022-06-15 MED ORDER — TRIPLE ANTIBIOTIC 3.5-400-5000 EX OINT: 1.0000 | TOPICAL_OINTMENT | Freq: Three times a day (TID) | CUTANEOUS | Status: DC | PRN

## 2022-06-15 MED ORDER — DEXAMETHASONE SODIUM PHOSPHATE 10 MG/ML IJ SOLN
INTRAMUSCULAR | Status: DC | PRN
  Administered 2022-06-15: 10 mg via INTRAVENOUS

## 2022-06-15 MED ORDER — BUPIVACAINE LIPOSOME 1.3 % IJ SUSP
INTRAMUSCULAR | Status: AC
  Filled 2022-06-15: qty 20

## 2022-06-15 MED ORDER — ACETAMINOPHEN 10 MG/ML IV SOLN: 1000.0000 mg | Freq: Once | INTRAVENOUS | Status: AC

## 2022-06-15 MED ORDER — PANTOPRAZOLE SODIUM 40 MG PO TBEC
80.0000 mg | DELAYED_RELEASE_TABLET | Freq: Every day | ORAL | Status: DC
  Administered 2022-06-16 – 2022-06-17 (×2): 80 mg via ORAL
  Filled 2022-06-15 (×2): qty 2

## 2022-06-15 MED ORDER — STERILE WATER FOR IRRIGATION IR SOLN
Status: DC | PRN
  Administered 2022-06-15: 1000 mL

## 2022-06-15 MED ORDER — INSULIN ASPART 100 UNIT/ML IJ SOLN: 0.0000 [IU] | Freq: Three times a day (TID) | INTRAMUSCULAR | Status: DC

## 2022-06-15 MED ORDER — ORAL CARE MOUTH RINSE: 15.0000 mL | Freq: Once | OROMUCOSAL | Status: AC

## 2022-06-15 MED ORDER — EPHEDRINE 5 MG/ML INJ
INTRAVENOUS | Status: AC
  Filled 2022-06-15: qty 5

## 2022-06-15 MED ORDER — HYOSCYAMINE SULFATE 0.125 MG SL SUBL: 0.1250 mg | SUBLINGUAL_TABLET | SUBLINGUAL | Status: DC | PRN

## 2022-06-15 MED ORDER — DIPHENHYDRAMINE HCL 12.5 MG/5ML PO ELIX: 12.5000 mg | ORAL_SOLUTION | Freq: Four times a day (QID) | ORAL | Status: DC | PRN

## 2022-06-15 MED ORDER — BUPIVACAINE LIPOSOME 1.3 % IJ SUSP
INTRAMUSCULAR | Status: DC | PRN
  Administered 2022-06-15: 20 mL

## 2022-06-15 MED ORDER — OXYCODONE HCL 5 MG PO TABS: 5.0000 mg | ORAL_TABLET | Freq: Once | ORAL | Status: DC | PRN

## 2022-06-15 MED ORDER — OXYCODONE HCL 5 MG PO TABS
5.0000 mg | ORAL_TABLET | ORAL | Status: DC | PRN
  Administered 2022-06-16 – 2022-06-17 (×4): 5 mg via ORAL
  Filled 2022-06-15 (×4): qty 1

## 2022-06-15 MED ORDER — ROCURONIUM BROMIDE 10 MG/ML (PF) SYRINGE
PREFILLED_SYRINGE | INTRAVENOUS | Status: DC | PRN
  Administered 2022-06-15 (×2): 10 mg via INTRAVENOUS
  Administered 2022-06-15: 60 mg via INTRAVENOUS

## 2022-06-15 MED ORDER — CHLORHEXIDINE GLUCONATE 0.12 % MT SOLN
15.0000 mL | Freq: Once | OROMUCOSAL | Status: AC
  Administered 2022-06-15: 15 mL via OROMUCOSAL

## 2022-06-15 MED ORDER — CEFAZOLIN SODIUM-DEXTROSE 2-4 GM/100ML-% IV SOLN
2.0000 g | INTRAVENOUS | Status: AC
  Administered 2022-06-15: 2 g via INTRAVENOUS
  Filled 2022-06-15: qty 100

## 2022-06-15 MED ORDER — HYDROMORPHONE HCL 1 MG/ML IJ SOLN
0.2500 mg | INTRAMUSCULAR | Status: DC | PRN
  Administered 2022-06-15 (×4): 0.25 mg via INTRAVENOUS

## 2022-06-15 MED ORDER — OXYCODONE HCL 5 MG/5ML PO SOLN: 5.0000 mg | Freq: Once | ORAL | Status: DC | PRN

## 2022-06-15 MED ORDER — ONDANSETRON HCL 4 MG/2ML IJ SOLN: 4.0000 mg | Freq: Once | INTRAMUSCULAR | Status: DC | PRN

## 2022-06-15 MED ORDER — FENTANYL CITRATE (PF) 100 MCG/2ML IJ SOLN
INTRAMUSCULAR | Status: DC | PRN
  Administered 2022-06-15: 25 ug via INTRAVENOUS
  Administered 2022-06-15: 100 ug via INTRAVENOUS
  Administered 2022-06-15: 25 ug via INTRAVENOUS
  Administered 2022-06-15: 50 ug via INTRAVENOUS

## 2022-06-15 MED ORDER — LACTATED RINGERS IV SOLN: INTRAVENOUS | Status: DC

## 2022-06-15 MED ORDER — HYDROMORPHONE HCL 1 MG/ML IJ SOLN
0.5000 mg | INTRAMUSCULAR | Status: DC | PRN
  Administered 2022-06-16 – 2022-06-17 (×2): 1 mg via INTRAVENOUS
  Filled 2022-06-15 (×2): qty 1

## 2022-06-15 MED ORDER — DEXMEDETOMIDINE HCL IN NACL 80 MCG/20ML IV SOLN
INTRAVENOUS | Status: AC
  Filled 2022-06-15: qty 20

## 2022-06-15 MED ORDER — HYDROCODONE-ACETAMINOPHEN 5-325 MG PO TABS: 1.0000 | ORAL_TABLET | Freq: Four times a day (QID) | ORAL | 0 refills | Status: DC | PRN

## 2022-06-15 MED ORDER — PROPOFOL 10 MG/ML IV BOLUS
INTRAVENOUS | Status: AC
  Filled 2022-06-15: qty 20

## 2022-06-15 MED ORDER — LIDOCAINE 2% (20 MG/ML) 5 ML SYRINGE
INTRAMUSCULAR | Status: DC | PRN
  Administered 2022-06-15: 80 mg via INTRAVENOUS

## 2022-06-15 MED ORDER — SODIUM CHLORIDE 0.45 % IV SOLN: INTRAVENOUS | Status: DC

## 2022-06-15 MED ORDER — FINASTERIDE 5 MG PO TABS
5.0000 mg | ORAL_TABLET | Freq: Every day | ORAL | Status: DC
  Administered 2022-06-16 – 2022-06-17 (×2): 5 mg via ORAL
  Filled 2022-06-15 (×2): qty 1

## 2022-06-15 MED ORDER — DIPHENHYDRAMINE HCL 50 MG/ML IJ SOLN: 12.5000 mg | Freq: Four times a day (QID) | INTRAMUSCULAR | Status: DC | PRN

## 2022-06-15 MED ORDER — SODIUM CHLORIDE (PF) 0.9 % IJ SOLN
INTRAMUSCULAR | Status: AC
  Filled 2022-06-15: qty 20

## 2022-06-15 MED ORDER — ONDANSETRON HCL 4 MG/2ML IJ SOLN
INTRAMUSCULAR | Status: DC | PRN
  Administered 2022-06-15: 4 mg via INTRAVENOUS

## 2022-06-15 MED ORDER — HYDROMORPHONE HCL 1 MG/ML IJ SOLN
INTRAMUSCULAR | Status: AC
  Administered 2022-06-15: 0.25 mg via INTRAVENOUS
  Filled 2022-06-15: qty 1

## 2022-06-15 SURGICAL SUPPLY — 75 items
ADH SKN CLS APL DERMABOND .7 (GAUZE/BANDAGES/DRESSINGS) ×1
APL PRP STRL LF DISP 70% ISPRP (MISCELLANEOUS) ×1
APL SWBSTK 6 STRL LF DISP (MISCELLANEOUS) ×1
APPLICATOR COTTON TIP 6 STRL (MISCELLANEOUS) ×1 IMPLANT
APPLICATOR COTTON TIP 6IN STRL (MISCELLANEOUS) ×1
BAG COUNTER SPONGE SURGICOUNT (BAG) IMPLANT
BAG SPNG CNTER NS LX DISP (BAG)
CATH FOLEY 2WAY SLVR 18FR 30CC (CATHETERS) ×1 IMPLANT
CATH FOLEY 3WAY 30CC 24FR (CATHETERS) ×1
CATH TIEMANN FOLEY 18FR 5CC (CATHETERS) IMPLANT
CATH URTH STD 24FR FL 3W 2 (CATHETERS) ×1 IMPLANT
CHLORAPREP W/TINT 26 (MISCELLANEOUS) ×1 IMPLANT
CLIP LIGATING HEM O LOK PURPLE (MISCELLANEOUS) IMPLANT
CLOTH BEACON ORANGE TIMEOUT ST (SAFETY) ×1 IMPLANT
COVER SURGICAL LIGHT HANDLE (MISCELLANEOUS) ×1 IMPLANT
COVER TIP SHEARS 8 DVNC (MISCELLANEOUS) ×1 IMPLANT
CUTTER ECHEON FLEX ENDO 45 340 (ENDOMECHANICALS) IMPLANT
DERMABOND ADVANCED .7 DNX12 (GAUZE/BANDAGES/DRESSINGS) ×1 IMPLANT
DRAIN CHANNEL RND F F (WOUND CARE) IMPLANT
DRAPE ARM DVNC X/XI (DISPOSABLE) ×4 IMPLANT
DRAPE COLUMN DVNC XI (DISPOSABLE) ×1 IMPLANT
DRAPE SURG IRRIG POUCH 19X23 (DRAPES) ×1 IMPLANT
DRIVER NDL LRG 8 DVNC XI (INSTRUMENTS) ×2 IMPLANT
DRIVER NDLE LRG 8 DVNC XI (INSTRUMENTS) ×2 IMPLANT
DRSG TEGADERM 4X4.75 (GAUZE/BANDAGES/DRESSINGS) ×1 IMPLANT
ELECT PENCIL ROCKER SW 15FT (MISCELLANEOUS) ×1 IMPLANT
ELECT REM PT RETURN 15FT ADLT (MISCELLANEOUS) ×1 IMPLANT
FORCEPS BPLR LNG DVNC XI (INSTRUMENTS) ×1 IMPLANT
FORCEPS PROGRASP DVNC XI (FORCEP) ×1 IMPLANT
GAUZE SPONGE 4X4 12PLY STRL (GAUZE/BANDAGES/DRESSINGS) ×1 IMPLANT
GLOVE BIO SURGEON STRL SZ 6.5 (GLOVE) ×1 IMPLANT
GLOVE BIOGEL PI IND STRL 7.5 (GLOVE) IMPLANT
GLOVE SURG LX STRL 7.5 STRW (GLOVE) ×2 IMPLANT
GOWN SRG XL LVL 4 BRTHBL STRL (GOWNS) ×1 IMPLANT
GOWN STRL NON-REIN XL LVL4 (GOWNS) ×1
GOWN STRL REUS W/ TWL XL LVL3 (GOWN DISPOSABLE) ×2 IMPLANT
GOWN STRL REUS W/TWL XL LVL3 (GOWN DISPOSABLE) ×2
HOLDER FOLEY CATH W/STRAP (MISCELLANEOUS) ×1 IMPLANT
IRRIG SUCT STRYKERFLOW 2 WTIP (MISCELLANEOUS) ×1
IRRIGATION SUCT STRKRFLW 2 WTP (MISCELLANEOUS) ×1 IMPLANT
IV LACTATED RINGERS 1000ML (IV SOLUTION) ×1 IMPLANT
KIT TURNOVER KIT A (KITS) IMPLANT
NDL INSUFFLATION 14GA 120MM (NEEDLE) ×1 IMPLANT
NEEDLE INSUFFLATION 14GA 120MM (NEEDLE) ×1 IMPLANT
PACK ROBOT UROLOGY CUSTOM (CUSTOM PROCEDURE TRAY) ×1 IMPLANT
PAD POSITIONING PINK XL (MISCELLANEOUS) ×1 IMPLANT
PORT ACCESS TROCAR AIRSEAL 12 (TROCAR) ×1 IMPLANT
RELOAD STAPLE 45 4.1 GRN THCK (STAPLE) IMPLANT
SCISSORS LAP 5X35 DISP (ENDOMECHANICALS) IMPLANT
SCISSORS MNPLR CVD DVNC XI (INSTRUMENTS) ×1 IMPLANT
SEAL UNIV 5-12 XI (MISCELLANEOUS) ×4 IMPLANT
SET CYSTO W/LG BORE CLAMP LF (SET/KITS/TRAYS/PACK) IMPLANT
SET TRI-LUMEN FLTR TB AIRSEAL (TUBING) ×1 IMPLANT
SOL ELECTROSURG ANTI STICK (MISCELLANEOUS) ×1
SOLUTION ELECTROSURG ANTI STCK (MISCELLANEOUS) ×1 IMPLANT
SPIKE FLUID TRANSFER (MISCELLANEOUS) ×1 IMPLANT
SPONGE T-LAP 4X18 ~~LOC~~+RFID (SPONGE) IMPLANT
STAPLE RELOAD 45 GRN (STAPLE) ×1 IMPLANT
STAPLE RELOAD 45MM GREEN (STAPLE) ×1
SUT ETHILON 3 0 PS 1 (SUTURE) ×1 IMPLANT
SUT MNCRL AB 4-0 PS2 18 (SUTURE) ×2 IMPLANT
SUT PDS AB 1 CT1 27 (SUTURE) ×2 IMPLANT
SUT VIC AB 0 CT1 27 (SUTURE) ×4
SUT VIC AB 0 CT1 27XBRD ANTBC (SUTURE) ×3 IMPLANT
SUT VIC AB 2-0 SH 27 (SUTURE) ×1
SUT VIC AB 2-0 SH 27X BRD (SUTURE) ×1 IMPLANT
SUT VICRYL 0 UR6 27IN ABS (SUTURE) ×1 IMPLANT
SUT VLOC 180 2-0 9IN GS21 (SUTURE) ×2 IMPLANT
SUT VLOC BARB 180 ABS3/0GR12 (SUTURE) ×2
SUTURE VLOC BRB 180 ABS3/0GR12 (SUTURE) ×2 IMPLANT
SYS BAG RETRIEVAL 10MM (BASKET) ×1
SYSTEM BAG RETRIEVAL 10MM (BASKET) ×1 IMPLANT
TROCAR Z-THREAD FIOS 5X100MM (TROCAR) IMPLANT
TROCAR Z-THREAD OPTICAL 5X100M (TROCAR) IMPLANT
WATER STERILE IRR 1000ML POUR (IV SOLUTION) ×1 IMPLANT

## 2022-06-15 NOTE — Discharge Instructions (Addendum)

## 2022-06-15 NOTE — Anesthesia Procedure Notes (Signed)
Procedure Name: Intubation Date/Time: 06/15/2022 7:45 AM  Performed by: Basilio Cairo, CRNAPre-anesthesia Checklist: Patient identified, Patient being monitored, Timeout performed, Emergency Drugs available and Suction available Patient Re-evaluated:Patient Re-evaluated prior to induction Oxygen Delivery Method: Circle system utilized Preoxygenation: Pre-oxygenation with 100% oxygen Induction Type: IV induction Ventilation: Mask ventilation without difficulty Laryngoscope Size: Mac and 3 Grade View: Grade I Tube type: Oral Tube size: 7.0 mm Number of attempts: 1 Placement Confirmation: ETT inserted through vocal cords under direct vision, positive ETCO2 and breath sounds checked- equal and bilateral Secured at: 21 cm Tube secured with: Tape Dental Injury: Teeth and Oropharynx as per pre-operative assessment

## 2022-06-15 NOTE — H&P (Signed)
Francisco [name] is an 89 y.o. male.    Chief Complaint: Pre-Op Simple Prostatectomy  HPI:   1 - Enlarged Prostate with Urinary Retention - At baseline manages with finasteride + BID tamsulosin. Prostate Vol 210gm with large median by CT 2022. NO retention. Stopped PSA screening 2013 at age 72 at which point PSA 1.68. Develoed frank retention 04/2022 and catheter appropriately placed. PVR's previously normal.   2 - Recurrent Urolithiasis -  2013 - - Rt ureteroscopy 2013 for small ureteral stone, mostly CaOx on eval gross hematuria.  09/2019 - stage Rt ureteroscopy for large renal and ureteral stones to stone free   Recent surveillance:  09/2020 - KUB, Renal US - ? punctate RLP, LUP renal stone, no hydro, stable Rt cyst w/o mass effect, PVR 60mL  10/2021 - KUB, Renal US - stable R>L non-obstructing renal stones and R>L non-complex cysts. PVR 5mL.   PMH sig for benign pancreatic mass resection (subcostal scar). NO ischemic CV disease / blood thinners. His PCP is Francisco Paradise MD. 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 NO interval fevers. Hgb 12.8, Cr 0.85, most recent UCX e. Cloli for which he has been on CX-specific bactrim to reduce colonization.    Past Medical History:  Diagnosis Date   Arthritis    Asthma with allergic rhinitis    BPH (benign prostatic hyperplasia)    DM (diabetes mellitus) (HCC)    Elevated CEA    Elevated prostate specific antigen (PSA)    Fatty liver    GERD (gastroesophageal reflux disease)    History of kidney stones    Hx of adenomatous colonic polyps 02/14/2016   10 adenomas max 15 mm 02/2016 - no recall at his age   Hyperlipidemia    Impotence of organic origin    Neoplasm of uncertain behavior of prostate    Pancreatic cyst FOLLOWED BY DR Christella Hartigan   Personal history of urinary calculi    Pneumonia    Renal calculi 09/19/2011    Right ureteral stone     Past Surgical History:  Procedure Laterality Date   CHOLECYSTECTOMY     done with Whipple   COLONOSCOPY W/ POLYPECTOMY  2018   CYSTOSCOPY WITH RETROGRADE PYELOGRAM, URETEROSCOPY AND STENT PLACEMENT Right 08/19/2019   Procedure: CYSTOSCOPY WITH RETROGRADE PYELOGRAM, URETEROSCOPY, LASER LITHOTRIPSY STENT PLACEMENT FIRST STAGE;  Surgeon: Sebastian Ache, MD;  Location: [facility];  Service: Urology;  Laterality: Right;  90 MINS   CYSTOSCOPY WITH RETROGRADE PYELOGRAM, URETEROSCOPY AND STENT PLACEMENT Right 09/04/2019   Procedure: CYSTOSCOPY WITH RETROGRADE PYELOGRAM, BASKETING OF STONES URETEROSCOPY AND STENT EXCHANGE;  Surgeon: Sebastian Ache, MD;  Location: [facility];  Service: Urology;  Laterality: Right;  60 MINS   CYSTOSCOPY WITH URETEROSCOPY  03/14/2011   Procedure: CYSTOSCOPY WITH URETEROSCOPY;  Surgeon: Milford Cage, MD;  Location: [facility];  Service: Urology;  Laterality: Right;  2 hours requested for this case  Right Ureter Stent Placement  C-ARM CAMERA DIGITAL URETEROSCOPE     EUS  04/12/2011   Procedure: UPPER ENDOSCOPIC ULTRASOUND (EUS) LINEAR;  Surgeon: Rachael Fee, MD;  Location: [facility];  Service: Endoscopy;  Laterality: N/A;  radial linear/pt moved up a hour early by AW , Patty @ [place] office ok'd & pt was called by AW   HOLMIUM LASER APPLICATION Right 08/19/2019   Procedure: HOLMIUM LASER APPLICATION;  Surgeon: Sebastian Ache, MD;  Location: [facility];  Service: Urology;  Laterality: Right;   KIDNEY STONE SURGERY  03/2011   PERCUTANEOUS NEPHROSTOLITHOTOMY  1985   WHIPPLE PROCEDURE  12/2011   [facility]    Family History  Problem Relation Age of Onset   Diabetes Mother    Diabetes Sister    Cancer Brother        unknown type   Stroke Father    Social History:  reports that he quit smoking about 31 years ago. His smoking use included cigarettes. He has never used smokeless tobacco. He reports current alcohol use.  He reports that he does not use drugs.  Allergies:  Allergies  Allergen Reactions   Metformin Hcl Other (See Comments)    GI upset    Medications Prior to Admission  Medication Sig Dispense Refill   ACCU-CHEK FASTCLIX LANCETS MISC by Does not apply route daily.     CREON 3000-9500 units CPEP Take 2 capsules by mouth 3 (three) times daily before meals.     empagliflozin (JARDIANCE) 10 MG TABS tablet Take 5 mg by mouth daily.     esomeprazole (NEXIUM) 40 MG capsule Take 40 mg by mouth 2 (two) times daily.     finasteride (PROSCAR) 5 MG tablet Take 5 mg by mouth daily.     furosemide (LASIX) 20 MG tablet Take 40 mg by mouth daily.     loperamide (IMODIUM A-D) 2 MG tablet Take 4 mg by mouth 2 (two) times daily.     Multiple Vitamin (MULTIVITAMIN) tablet Take 1 tablet by mouth daily.     oxybutynin (DITROPAN) 5 MG tablet Take 5 mg by mouth every 8 (eight) hours as needed for bladder spasms.     Probiotic Product (ALIGN) 4 MG CAPS Take 4 mg by mouth daily.      sulfamethoxazole-trimethoprim (BACTRIM DS) 800-160 MG tablet Take 1 tablet by mouth 2 (two) times daily. Start 2 days before procedure     tamsulosin (FLOMAX) 0.4 MG CAPS capsule Take 0.4 mg by mouth daily.     CINNAMON PO Take 1 tablet by mouth daily.     nystatin-triamcinolone (MYCOLOG II) cream Apply 1 Application topically daily as needed (itching).      Results for orders placed or performed during the hospital encounter of 06/15/22 (from the past 48 hour(s))  Glucose, capillary     Status: None   Collection Time: 06/15/22  5:46 AM  Result Value Ref Range   Glucose-Capillary 72 70 - 99 mg/dL    Comment: Glucose reference range applies only to samples taken after fasting for at least 8 hours.   No results found.  Review of Systems  Constitutional:  Negative for chills and fever.  All other systems reviewed and are negative.   Blood pressure (!) 124/57, pulse (!) 50, temperature 97.9 F (36.6 C), temperature source Oral,  resp. rate 16, height 5\' 8"  (1.727 m), weight 58 kg, SpO2 99 %. Physical Exam Constitutional:      Comments: Very mentally spry, physically with some frailty. Wife at bedside. Both very pleasant.   HENT:     Head: Normocephalic.     Mouth/Throat:     Mouth: Mucous membranes are moist.  Eyes:     Pupils: Pupils are equal, round, and reactive to light.  Cardiovascular:     Rate and Rhythm: Normal rate.  Pulmonary:     Effort: Pulmonary effort is normal.  Abdominal:     General: Abdomen is  flat.     Comments: Stable upper abd scar.   Genitourinary:    Comments: Catheter in place with non-foul urine.  Musculoskeletal:        General: Normal range of motion.     Cervical back: Normal range of motion.  Skin:    General: Skin is warm.  Neurological:     General: No focal deficit present.     Mental Status: He is alert.  Psychiatric:        Mood and Affect: Mood normal.      Assessment/Plan  Proceed as planned with simple prostatecotmy as he adamantly wants to be catheter free. Risks, benefits, alternatives, expected peri-op course discussed previously and reiterated today. He understands that his advanced age and baseline frailty increases risk of ALL peri-op complicairtons including MI, CVA, Infections, and mortality.   Loletta Parish., MD 06/15/2022, 6:39 AM

## 2022-06-15 NOTE — Brief Op Note (Signed)
06/15/2022  10:55 AM  PATIENT:  Francisco [name]  89 y.o. male  PRE-OPERATIVE DIAGNOSIS:  MASSIVE PROSTATE AND RETENTION  POST-OPERATIVE DIAGNOSIS:  MASSIVE PROSTATE AND RETENTION  PROCEDURE:  Procedure(s): XI ROBOTIC ASSISTED SIMPLE PROSTATECTOMY (N/A)  SURGEON:  Surgeon(s) and Role:    * Burrell Hodapp, Delbert Phenix., MD - Primary  PHYSICIAN ASSISTANT:   ASSISTANTS: Harrie Foreman PA   ANESTHESIA:   regional and general  EBL:  275 mL   BLOOD ADMINISTERED:none  DRAINS:  1 -JP to bulb; 2 - FOley to gravity (irrigation port plugged)    LOCAL MEDICATIONS USED:  MARCAINE     SPECIMEN:  Source of Specimen:  prostate adenoma  DISPOSITION OF SPECIMEN:  PATHOLOGY  COUNTS:  YES  TOURNIQUET:  * No tourniquets in log *  DICTATION: .Other Dictation: Dictation Number [id number]  PLAN OF CARE: Admit to inpatient   PATIENT DISPOSITION:  PACU - hemodynamically stable.   Delay start of Pharmacological VTE agent (>24hrs) [name] to surgical blood loss or risk of bleeding: yes

## 2022-06-15 NOTE — Transfer of Care (Signed)
Immediate Anesthesia Transfer of Care Note  Patient: Francisco [name]  Procedure(s) Performed: Procedure(s): XI ROBOTIC ASSISTED SIMPLE PROSTATECTOMY (N/A)  Patient Location: PACU  Anesthesia Type:General  Level of Consciousness: Alert, Awake, Oriented  Airway & Oxygen Therapy: Patient Spontanous Breathing  Post-op Assessment: Report given to RN  Post vital signs: Reviewed and stable  Last Vitals:  Vitals:   06/15/22 0611  BP: (!) 124/57  Pulse: (!) 50  Resp: 16  Temp: 36.6 C  SpO2: 99%    Complications: No apparent anesthesia complications

## 2022-06-15 NOTE — Op Note (Unsigned)
NAME: Francisco [name], DILES MEDICAL RECORD NO: [id number] ACCOUNT NO: [id number] DATE OF BIRTH: Jun 27, 1933 FACILITY: [facility] LOCATION: [facility] PHYSICIAN: Sebastian Ache, MD  Operative Report   DATE OF PROCEDURE: 06/15/2022  PREOPERATIVE DIAGNOSIS:  Very large prostatic hypertrophy with refractory urinary retention.  History of prior intraabdominal surgery.  PROCEDURES:  Robotic-assisted laparoscopic simple prostatectomy.  ESTIMATED BLOOD LOSS:  275 mL.  COMPLICATIONS:  None.  SPECIMEN:  Large prostate adenoma for permanent pathology.  FINDINGS:   1.  Significant intraabdominal adhesions, mostly in the supraumbilical area.  This did necessitate limited adhesiolysis. 2.  Trilobar prostatic hypertrophy with some dystrophic calcifications. 3.  Wide open prostate fossa in the bladder neck to the membranous urethra following simple prostatectomy.  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  He has been on maximal medical therapy with alpha blockers and 5 alpha reductase  inhibitors for years and has been compliant with residual monitoring.  Unfortunately, he developed frank retention several months ago, despite maximal medical therapy.  His prostate volume was noted to be over 200 grams.  Options were discussed for  management including self catheterization versus chronic catheters versus outlet procedure with simple prostatectomy being most definitive given his gland size and he adamantly wished to be on path of catheter free. He does have a history of prior  intraabdominal surgery.  Dissection of the pancreatic mass years ago through a subcostal approach and preoperative imaging did reveal preserved fat planes in the abdomen infraumbilically and was felt that a simple prostatectomy would be safe.  Informed  consent was obtained and placed in medical record.  PROCEDURE IN  DETAIL:  The patient being identified and verified, procedure being robotic simple prostatectomy was confirmed.  Procedure timeout was performed.  Intravenous antibiotics administered.  General endotracheal anesthesia induced.  The patient  was placed into a low lithotomy position.  Sterile field was created, prepped and draped the patient's penis, perineum, and proximal thighs using iodine and his infra-xiphoid abdomen using chlorhexidine gluconate after he was further fastened to  operating table using 3-inch tape with foam padding across supraxiphoid chest.  His arms were tucked to the side, padded with gel rolls.  Test of steep Trendelenburg positioning was performed found to be suitably positioned.  Foley catheter was placed  per urethra to straight drain.  His old one had been previously removed.  Next, a high flow, low pressure, pneumoperitoneum was obtained using Veress technique in the supraumbilical crease after passing the aspiration and drop test and initial entry port  was placed in the left paramedian location approximately 4 fingerbreadths inferior lateral to the insufflation site.  Laparoscopic examination of peritoneal cavity did reveal some significant adhesions mostly in the supraumbilical area as anticipated.  There were several loops of small bowel.  There was essentially quite plastered to the anterior abdominal wall.  There were several loops of this very loose adhesions as well.  Laparoscopic scissors were used after a second port was placed in left far  lateral position and limited adhesiolysis was performed allowing a better abdominal window for port placement.  An 8 mm robotic camera port was placed in the initial insufflation site and a 8 mm right paramedian robotic port, right far lateral 8 mm  assist port and right paramedian 5 mm suction port were carefully placed.  Robot was docked and passed the electronic checks.  Initial attention was directed at development of space  of  Retzius.  Incision was made lateral to the right inguinal ligament  from the midline towards the internal ring coursing along the iliac vessels towards the right ureter, which was positively identified.  Right vas deferens was encountered, ligated using medial bucket handle and the right bladder wall swept with pelvic  sidewall towards the endopelvic fascia on the right side.  A mirror image dissection was performed in the left side.  Anterior attachments were taken down with electrocautery scissors.  The anterior base of the prostate, which was defatted to better  denote the bladder, prostate neck junction.  The apex was discarded in the area of the dorsal venous complex was carefully dissected to allow visualization of this.  This controlled using green load stapler, which resulted in excellent hemostatic control of  the dorsal venous complex.  Next, the true bladder neck was identified moving the Foley catheter back and forth and an inverted U cystotomy was made approximately 1.5 cm proximal to the true bladder neck.  This was performed for approximately 50%  circumference of the bladder, which allowed excellent visualization of the large trilobar adenoma in the trigone region.  A large 0 Vicryl figure-of-eight sutures were placed in the median lobe posteriorly and the right and left lobes respectively.  The  adenoma was placed on a superior anterior traction and the posterior plane was entered approximately 1.5-2 cm distal to the trigonal ridge well away from the ureters.  The adenoma plane was entered posteriorly at 6 o'clock and then carefully dissected  all the way towards the area of the apex, as verified by anterior curvature.  This was swept laterally to the 3 o'clock and 9 o'clock positions respectively and mucosal incisions were carried out posterior to anterior position circumferentially and the  right lobe of the adenoma was dissected away from the capsule all the way to visualization and then  finally the left lobe as well.  Final apical dissection was performed by performing a butterfly type incision at 12 o'clock position in the adenoma, which  allowed much better visualization of the true apex, which was separated from the true membranous urethra using cold scissors.  This completely freed up the large adenoma specimen was placed in EndoCatch bag for later retrieval.  Next, digital rectal  exam was performed using indicator glove.  No evidence of rectal violation was noted.  Additional hemostasis was achieved with the fossa using point coagulation current cautery, which resulted in quite good hemostasis.  Next, mucosal advancement was  performed using double arm 3-0 V-Loc suture, reapproximating the posterior bladder neck mucosa to the membranous urethra essentially forming approximately 50% circumference posterior urethral anastomosis.  This provided excellent mucosal bridge to avoid  any sort of undermining of the bladder with catheter placement to help prevent strictures.  Next, the cystotomy was closed using two separate running suture lines of 2-0 V-Loc ending in the midline to the locked position, which was sewn to each other and  a 24-French 3-way Foley catheter was placed 30 mL of water in the balloon, irrigated quantitatively.  Sponge and needle counts were correct.  Hemostasis was quite good.  A closed suction drain was brought out the previous left lateral most robotic site  into the peritoneal cavity.  The previous right lateral most assistant port site was closed at the level of fascia using Carter-Thomason suture passer and 0 Vicryl.  Robot was then undocked.  Specimen was retrieved by extending the previous camera port  site inferiorly  for a total distance of approximately 4 cm and the large adenoma specimen was removed, set aside for pathology.  Extraction site was closed with fascia using figure-of-eight PDS x4 and the skin was reapproximated using Monocryl.  All  incision  sites were infiltrated with dilute lipolyzed Marcaine and then further closed level of skin using subcuticular Monocryl followed by Dermabond.  Procedure was terminated.  The patient tolerated procedure well, no immediate perioperative  complications.  The patient was taken to the postanesthesia care in stable condition.  Plan for observation admission.  Please note, first assistant, Harrie Foreman, was crucial for all portions of the surgery today.  She provided invaluable retraction, suctioning, vascular stapling, specimen manipulation, robotic instrument exchange and general first assistance.   PUS D: 06/15/2022 11:05:19 am T: 06/15/2022 11:40:00 am  JOB: [id number]/ [id number]

## 2022-06-15 NOTE — Anesthesia Postprocedure Evaluation (Signed)
Anesthesia Post Note  Patient: Francisco [name]  Procedure(s) Performed: XI ROBOTIC ASSISTED SIMPLE PROSTATECTOMY (Abdomen)     Patient location during evaluation: PACU Anesthesia Type: General Level of consciousness: awake and alert and oriented Pain management: pain level controlled Vital Signs Assessment: post-procedure vital signs reviewed and stable Respiratory status: spontaneous breathing, nonlabored ventilation and respiratory function stable Cardiovascular status: blood pressure returned to baseline and stable Postop Assessment: no apparent nausea or vomiting Anesthetic complications: no   No notable events documented.  Last Vitals:  Vitals:   06/15/22 1125 06/15/22 1147  BP:  (!) 95/52  Pulse: 80 74  Resp: 20 15  Temp:    SpO2: 100% 100%    Last Pain:  Vitals:   06/15/22 1227  TempSrc:   PainSc: 7                  Caisley Baxendale A.

## 2022-06-16 ENCOUNTER — Encounter (HOSPITAL_COMMUNITY): Payer: Self-pay | Admitting: Urology

## 2022-06-16 LAB — GLUCOSE, CAPILLARY
Glucose-Capillary: 155 mg/dL — ABNORMAL HIGH (ref 70–99)
Glucose-Capillary: 163 mg/dL — ABNORMAL HIGH (ref 70–99)
Glucose-Capillary: 146 mg/dL — ABNORMAL HIGH (ref 70–99)
Glucose-Capillary: 109 mg/dL — ABNORMAL HIGH (ref 70–99)

## 2022-06-16 LAB — BASIC METABOLIC PANEL
Anion gap: 5 (ref 5–15)
BUN: 23 mg/dL (ref 8–23)
CO2: 21 mmol/L — ABNORMAL LOW (ref 22–32)
Calcium: 7.9 mg/dL — ABNORMAL LOW (ref 8.9–10.3)
Chloride: 105 mmol/L (ref 98–111)
Creatinine, Ser: 1.22 mg/dL (ref 0.61–1.24)
GFR, Estimated: 58 mL/min — ABNORMAL LOW (ref >60)
Glucose, Bld: 183 mg/dL — ABNORMAL HIGH (ref 70–99)
Potassium: 4.4 mmol/L (ref 3.5–5.1)
Sodium: 131 mmol/L — ABNORMAL LOW (ref 135–145)

## 2022-06-16 LAB — HEMOGLOBIN AND HEMATOCRIT, BLOOD
HCT: 33.6 % — ABNORMAL LOW (ref 39.0–52.0)
Hemoglobin: 10.9 g/dL — ABNORMAL LOW (ref 13.0–17.0)

## 2022-06-16 MED ORDER — CHLORHEXIDINE GLUCONATE CLOTH 2 % EX PADS
6.0000 | MEDICATED_PAD | Freq: Every day | CUTANEOUS | Status: DC
  Administered 2022-06-16 – 2022-06-17 (×2): 6 via TOPICAL

## 2022-06-16 NOTE — Evaluation (Signed)
Physical Therapy Evaluation Patient Details Name: Francisco [name] MRN: [id number] DOB: 1933-02-03 Today's Date: 06/16/2022  History of Present Illness  89 yo male admitted with BPH with obstruction. S/P simple prostatectomy 06/15/22. Hx of DM, pancreatic cyst, BPH, asthma, whipple 2023  Clinical Impression  On eval, pt was Supv level for mobility. He walked ~150 feet with a RW. Moderate "rib" pain with activity. Pt tolerated activity well. No LOB with RW use. Do not anticipate any follow up PT needs after discharge. Pt reports he lives with his wife and that he has access to DME at home- "I have everything." Will plan to follow and progress activity as tolerated.      Recommendations for follow up therapy are one component of a multi-disciplinary discharge planning process, led by the attending physician.  Recommendations may be updated based on patient status, additional functional criteria and insurance authorization.  Follow Up Recommendations       Assistance Recommended at Discharge PRN  Patient can return home with the following  Assistance with cooking/housework;Assist for transportation;Help with stairs or ramp for entrance    Equipment Recommendations None recommended by PT (pt stated he has access to DME)  Recommendations for Other Services       Functional Status Assessment Patient has had a recent decline in their functional status and demonstrates the ability to make significant improvements in function in a reasonable and predictable amount of time.     Precautions / Restrictions Precautions Precautions: Fall Precaution Comments: L jp drain Restrictions Weight Bearing Restrictions: No      Mobility  Bed Mobility Overal bed mobility: Modified Independent                  Transfers Overall transfer level: Modified independent Equipment used: Rolling walker (2 wheels)                    Ambulation/Gait Ambulation/Gait assistance:  Supervision Gait Distance (Feet): 150 Feet Assistive device: Rolling walker (2 wheels) Gait Pattern/deviations: Step-through pattern, Trunk flexed       General Gait Details: Supv for safety. Pt tolerated distance well. No LOB with RW use.  Stairs            Wheelchair Mobility    Modified Rankin (Stroke Patients Only)       Balance Overall balance assessment: Needs assistance         Standing balance support: During functional activity, Reliant on assistive device for balance Standing balance-Leahy Scale: Fair                               Pertinent Vitals/Pain Pain Assessment Pain Assessment: Faces Faces Pain Scale: Hurts even more Pain Location: ribs Pain Descriptors / Indicators: Grimacing Pain Intervention(s): Monitored during session, Repositioned    Home Living Family/patient expects to be discharged to:: Private residence Living Arrangements: Spouse/significant other Available Help at Discharge: Family Type of Home: House Home Access: Level entry       Home Layout: One level Home Equipment: Agricultural consultant (2 wheels);Cane - single point      Prior Function Prior Level of Function : Independent/Modified Independent                     Hand Dominance        Extremity/Trunk Assessment   Upper Extremity Assessment Upper Extremity Assessment: Generalized weakness    Lower Extremity Assessment Lower Extremity Assessment:  Generalized weakness    Cervical / Trunk Assessment Cervical / Trunk Assessment: Kyphotic  Communication      Cognition Arousal/Alertness: Awake/alert Behavior During Therapy: WFL for tasks assessed/performed Overall Cognitive Status: Within Functional Limits for tasks assessed                                          General Comments      Exercises     Assessment/Plan    PT Assessment Patient needs continued PT services  PT Problem List Decreased strength;Decreased  activity tolerance;Decreased balance;Decreased mobility       PT Treatment Interventions DME instruction;Gait training;Balance training;Functional mobility training;Therapeutic activities;Patient/family education;Therapeutic exercise    PT Goals (Current goals can be found in the Care Plan section)  Acute Rehab PT Goals Patient Stated Goal: less pain. home on Father's Day PT Goal Formulation: With patient Time For Goal Achievement: 06/30/22 Potential to Achieve Goals: Good    Frequency Min 1X/week     Co-evaluation               AM-PAC PT "6 Clicks" Mobility  Outcome Measure Help needed turning from your back to your side while in a flat bed without using bedrails?: None Help needed moving from lying on your back to sitting on the side of a flat bed without using bedrails?: None Help needed moving to and from a bed to a chair (including a wheelchair)?: None Help needed standing up from a chair using your arms (e.g., wheelchair or bedside chair)?: None Help needed to walk in hospital room?: A Little Help needed climbing 3-5 steps with a railing? : A Little 6 Click Score: 22    End of Session   Activity Tolerance: Patient tolerated treatment well Patient left: in chair;with call bell/phone within reach;with chair alarm set   PT Visit Diagnosis: Pain;Other abnormalities of gait and mobility (R26.89) Pain - part of body:  (trunk/abdomen)    Time: 1610-9604 PT Time Calculation (min) (ACUTE ONLY): 18 min   Charges:   PT Evaluation $PT Eval Low Complexity: 1 Low             Faye Ramsay, PT Acute Rehabilitation  Office: [phone]

## 2022-06-16 NOTE — Progress Notes (Signed)
1 Day Post-Op Subjective: The patient is doing well.  No nausea or vomiting. Pain is adequately controlled. Some pain in rib cage but no SOB, CP. No flatus.  Objective: Vital signs in last 24 hours: Temp:  [97.5 F (36.4 C)-98 F (36.7 C)] 97.8 F (36.6 C) (06/15 0725) Pulse Rate:  [68-92] 84 (06/15 0725) Resp:  [11-21] 20 (06/15 0725) BP: (82-141)/(50-72) 141/72 (06/15 0725) SpO2:  [92 %-100 %] 97 % (06/15 0725)  Intake/Output from previous day: 06/14 0701 - 06/15 0700 In: 1650.5 [P.O.:60; I.V.:1490.5; IV Piggyback:100] Out: 1100 [Urine:595; Drains:230; Blood:275] Intake/Output this shift: Total I/O In: 240 [P.O.:240] Out: 190 [Urine:50; Drains:140]  Physical Exam:  General: Alert and oriented. CV: RRR Lungs: Clear bilaterally. GI: Soft, Nondistended. JP with s/s output Incisions: Clean, dry, and intact Urine: Cola colored urine Extremities: Nontender, no erythema, no edema.  Lab Results: Recent Labs    06/15/22 1156 06/16/22 0544  HGB 12.5* 10.9*  HCT 38.8* 33.6*      Assessment/Plan: POD# 1 s/p robotic simple prostatectomy.  1) SL IVF 2) Regular diet 3) Ambulate, Incentive spirometry 4) PT consult placed; hope for eval today 5) Transition to oral pain medication 6) Continue JP drain today; will monitor output over the next 24h and determine need for JP Cr prior to discharge 7) AM labs tomorrow   LOS: 1 day   Francisco [name] 06/16/2022, 9:03 AM

## 2022-06-17 LAB — HEMOGLOBIN AND HEMATOCRIT, BLOOD
HCT: 33 % — ABNORMAL LOW (ref 39.0–52.0)
Hemoglobin: 10.4 g/dL — ABNORMAL LOW (ref 13.0–17.0)

## 2022-06-17 LAB — BASIC METABOLIC PANEL
Anion gap: 6 (ref 5–15)
BUN: 19 mg/dL (ref 8–23)
CO2: 22 mmol/L (ref 22–32)
Calcium: 7.8 mg/dL — ABNORMAL LOW (ref 8.9–10.3)
Chloride: 105 mmol/L (ref 98–111)
Creatinine, Ser: 0.93 mg/dL (ref 0.61–1.24)
GFR, Estimated: >60 mL/min (ref >60)
Glucose, Bld: 101 mg/dL — ABNORMAL HIGH (ref 70–99)
Potassium: 4.5 mmol/L (ref 3.5–5.1)
Sodium: 133 mmol/L — ABNORMAL LOW (ref 135–145)

## 2022-06-17 LAB — CREATININE, FLUID (PLEURAL, PERITONEAL, JP DRAINAGE): Creat, Fluid: 1 mg/dL

## 2022-06-17 LAB — GLUCOSE, CAPILLARY
Glucose-Capillary: 94 mg/dL (ref 70–99)
Glucose-Capillary: 128 mg/dL — ABNORMAL HIGH (ref 70–99)

## 2022-06-17 MED ORDER — ACETAMINOPHEN 500 MG PO TABS
1000.0000 mg | ORAL_TABLET | Freq: Four times a day (QID) | ORAL | Status: DC
  Administered 2022-06-17: 1000 mg via ORAL
  Filled 2022-06-17: qty 2

## 2022-06-17 NOTE — Progress Notes (Signed)
Transition of Care [facility]) - Inpatient Brief Assessment   Patient Details  Name: Francisco [name] MRN: [id number] Date of Birth: [birth year]  [facility]) CM/SW Contact:    Adrian Prows, RN Phone Number: 06/17/2022, 11:29 AM   Clinical Narrative:  Brief assessment completed; pt's POC wife Francisco [name] [phone]).  Transition of Care Asessment: Insurance and Status: Insurance coverage has been reviewed Patient has primary care physician: Yes Home environment has been reviewed: yes Prior level of function:: independent Prior/Current Home Services: No current home services Social Determinants of Health Reivew: SDOH reviewed no interventions necessary Readmission risk has been reviewed: Yes Transition of care needs: no transition of care needs at this time

## 2022-06-17 NOTE — Discharge Summary (Addendum)
Date of admission: 06/15/2022  Date of discharge: 06/17/2022  Admission diagnosis: BPH   Discharge diagnosis: BPH  History and Physical: For full details, please see admission history and physical. Briefly, Francisco [name] is a 89 y.o. gentleman with BPH with LUTS.  After discussing management/treatment options, he elected to proceed with surgical treatment.  Hospital Course: Francisco [name] was taken to the operating room on 06/15/2022 and underwent a robotic assisted laparoscopic simple prostatectomy. He tolerated this procedure well and without complications. Postoperatively, he was able to be transferred to a regular hospital room following recovery from anesthesia.  He was able to begin ambulating on POD#1. He was evaluated on POD#1 by PT who felt he did not require additional evaluation or services on discharge. He remained hemodynamically stable through POD#2. He had excellent urine output with moderate output from his pelvic drain. The drain was tested on POD#2 and was negative for a urine leak. The drain was removed. He was transitioned to oral pain medication, tolerated a regular diet, and had met all discharge criteria and was able to be discharged home the morning of POD#2 with his foley catheter to drainage.  EXAM: NAD, Aox3 Foley draining clear with some bloody sediment Incision clear and intact Drain with s/s output (removed prior to discharge)  Laboratory values:  Recent Labs    06/15/22 1156 06/16/22 0544 06/17/22 0517  HGB 12.5* 10.9* 10.4*  HCT 38.8* 33.6* 33.0*    Disposition: Home  Discharge instruction: He was instructed to be ambulatory but to refrain from heavy lifting, strenuous activity, or driving. He was instructed on urethral catheter care.  Discharge medications:   Allergies as of 06/17/2022       Reactions   Metformin Hcl Other (See Comments)   GI upset        Medication List     STOP taking these medications    CINNAMON PO   multivitamin  tablet   tamsulosin 0.4 MG Caps capsule Commonly known as: FLOMAX       TAKE these medications    Accu-Chek FastClix Lancets Misc by Does not apply route daily.   Align 4 MG Caps Take 4 mg by mouth daily.   Creon 3000-9500 units Cpep Generic drug: Pancrelipase (Lip-Prot-Amyl) Take 2 capsules by mouth 3 (three) times daily before meals.   docusate sodium 100 MG capsule Commonly known as: COLACE Take 1 capsule (100 mg total) by mouth 2 (two) times daily.   empagliflozin 10 MG Tabs tablet Commonly known as: JARDIANCE Take 5 mg by mouth daily.   esomeprazole 40 MG capsule Commonly known as: NEXIUM Take 40 mg by mouth 2 (two) times daily.   finasteride 5 MG tablet Commonly known as: PROSCAR Take 5 mg by mouth daily.   furosemide 20 MG tablet Commonly known as: LASIX Take 40 mg by mouth daily.   HYDROcodone-acetaminophen 5-325 MG tablet Commonly known as: Norco Take 1-2 tablets by mouth every 6 (six) hours as needed for moderate pain or severe pain.   loperamide 2 MG tablet Commonly known as: IMODIUM A-D Take 4 mg by mouth 2 (two) times daily.   nystatin-triamcinolone cream Commonly known as: MYCOLOG II Apply 1 Application topically daily as needed (itching).   oxybutynin 5 MG tablet Commonly known as: DITROPAN Take 5 mg by mouth every 8 (eight) hours as needed for bladder spasms.   sulfamethoxazole-trimethoprim 800-160 MG tablet Commonly known as: BACTRIM DS Take 1 tablet by mouth 2 (two) times daily. Start the day  prior to foley removal appointment What changed: additional instructions        Followup: He will followup in 1 week for catheter removal

## 2022-06-17 NOTE — Progress Notes (Signed)
Physical Therapy Treatment Patient Details Name: Francisco [name] MRN: [id number] DOB: Dec 25, 1933 Today's Date: 06/17/2022   History of Present Illness 89 yo male admitted with BPH with obstruction. S/P simple prostatectomy 06/15/22. Hx of DM, pancreatic cyst, BPH, asthma, whipple 2023    PT Comments    Progressing with mobility. Moderate pain around ribs/drain site. Instructed pt to continue RW use for ambulation safety.    Recommendations for follow up therapy are one component of a multi-disciplinary discharge planning process, led by the attending physician.  Recommendations may be updated based on patient status, additional functional criteria and insurance authorization.  Follow Up Recommendations       Assistance Recommended at Discharge PRN  Patient can return home with the following Assistance with cooking/housework;Assist for transportation;Help with stairs or ramp for entrance   Equipment Recommendations  None recommended by PT    Recommendations for Other Services       Precautions / Restrictions Precautions Precautions: Fall Precaution Comments: L jp drain Restrictions Weight Bearing Restrictions: No     Mobility  Bed Mobility Overal bed mobility: Modified Independent             General bed mobility comments: used rails. increased time. no assistance provided.    Transfers Overall transfer level: Modified independent Equipment used: Rolling walker (2 wheels) Transfers: Sit to/from Stand Sit to Stand: From elevated surface, Modified independent (Device/Increase time)           General transfer comment: Increased time.    Ambulation/Gait Ambulation/Gait assistance: Supervision Gait Distance (Feet): 225 Feet Assistive device: Rolling walker (2 wheels) Gait Pattern/deviations: Step-through pattern, Decreased stride length, Trunk flexed       General Gait Details: Supv for safety. Pt tolerated distance well. No LOB with RW use.   Stairs              Wheelchair Mobility    Modified Rankin (Stroke Patients Only)       Balance Overall balance assessment: Needs assistance         Standing balance support: During functional activity, Reliant on assistive device for balance Standing balance-Leahy Scale: Fair                              Cognition Arousal/Alertness: Awake/alert Behavior During Therapy: WFL for tasks assessed/performed Overall Cognitive Status: Within Functional Limits for tasks assessed                                          Exercises      General Comments        Pertinent Vitals/Pain Pain Assessment Pain Assessment: Faces Faces Pain Scale: Hurts even more Pain Location: ribs Pain Descriptors / Indicators: Grimacing, Operative site guarding Pain Intervention(s): Monitored during session, Repositioned    Home Living                          Prior Function            PT Goals (current goals can now be found in the care plan section) Progress towards PT goals: Progressing toward goals    Frequency    Min 1X/week      PT Plan Current plan remains appropriate    Co-evaluation  AM-PAC PT "6 Clicks" Mobility   Outcome Measure  Help needed turning from your back to your side while in a flat bed without using bedrails?: None Help needed moving from lying on your back to sitting on the side of a flat bed without using bedrails?: None Help needed moving to and from a bed to a chair (including a wheelchair)?: None Help needed standing up from a chair using your arms (e.g., wheelchair or bedside chair)?: None Help needed to walk in hospital room?: A Little Help needed climbing 3-5 steps with a railing? : A Little 6 Click Score: 22    End of Session Equipment Utilized During Treatment: Gait belt Activity Tolerance: Patient tolerated treatment well Patient left: in chair;with call bell/phone within reach   PT Visit  Diagnosis: Pain;Other abnormalities of gait and mobility (R26.89) Pain - part of body:  (abdomen)     Time: 1610-9604 PT Time Calculation (min) (ACUTE ONLY): 15 min  Charges:  $Gait Training: 8-22 mins                        Faye Ramsay, PT Acute Rehabilitation  Office: [phone]

## 2022-06-19 LAB — SURGICAL PATHOLOGY

## 2022-06-20 ENCOUNTER — Other Ambulatory Visit (HOSPITAL_COMMUNITY): Payer: Self-pay

## 2022-06-21 ENCOUNTER — Other Ambulatory Visit (HOSPITAL_COMMUNITY): Payer: Self-pay

## 2022-06-22 ENCOUNTER — Other Ambulatory Visit (HOSPITAL_COMMUNITY): Payer: Self-pay

## 2022-06-22 ENCOUNTER — Encounter (HOSPITAL_COMMUNITY): Payer: Self-pay

## 2022-06-22 ENCOUNTER — Other Ambulatory Visit: Payer: Self-pay

## 2022-06-22 MED ORDER — FUROSEMIDE 20 MG PO TABS: 20.0000 mg | ORAL_TABLET | Freq: Every day | ORAL | 1 refills | Status: DC | PRN

## 2022-06-22 MED ORDER — FINASTERIDE 5 MG PO TABS
5.0000 mg | ORAL_TABLET | Freq: Every day | ORAL | 3 refills | Status: DC
  Filled 2022-09-10: qty 90, 90d supply, fill #0

## 2022-06-22 MED ORDER — ACCU-CHEK GUIDE VI STRP
ORAL_STRIP | 6 refills | Status: DC
  Filled 2022-06-22: qty 100, 50d supply, fill #0

## 2022-06-22 MED ORDER — OXYBUTYNIN CHLORIDE 5 MG PO TABS: 5.0000 mg | ORAL_TABLET | Freq: Three times a day (TID) | ORAL | 1 refills | Status: DC | PRN

## 2022-06-22 MED ORDER — FUROSEMIDE 20 MG PO TABS
40.0000 mg | ORAL_TABLET | Freq: Every day | ORAL | 1 refills | Status: DC
  Filled 2022-06-22: qty 180, 90d supply, fill #0

## 2022-06-22 MED ORDER — EMPAGLIFLOZIN 10 MG PO TABS
10.0000 mg | ORAL_TABLET | Freq: Every day | ORAL | 3 refills | Status: DC
  Filled 2022-06-22 – 2022-06-25 (×2): qty 90, 90d supply, fill #0
  Filled 2022-10-01: qty 30, 30d supply, fill #1

## 2022-06-22 MED ORDER — TAMSULOSIN HCL 0.4 MG PO CAPS: 0.4000 mg | ORAL_CAPSULE | Freq: Two times a day (BID) | ORAL | 3 refills | Status: DC

## 2022-06-25 ENCOUNTER — Other Ambulatory Visit: Payer: Self-pay

## 2022-06-25 ENCOUNTER — Other Ambulatory Visit (HOSPITAL_COMMUNITY): Payer: Self-pay

## 2022-06-25 DIAGNOSIS — R338 Other retention of urine: Secondary | ICD-10-CM | POA: Diagnosis not present

## 2022-06-27 DIAGNOSIS — N401 Enlarged prostate with lower urinary tract symptoms: Secondary | ICD-10-CM | POA: Diagnosis not present

## 2022-06-27 DIAGNOSIS — Z9079 Acquired absence of other genital organ(s): Secondary | ICD-10-CM | POA: Diagnosis not present

## 2022-06-27 DIAGNOSIS — K869 Disease of pancreas, unspecified: Secondary | ICD-10-CM | POA: Diagnosis not present

## 2022-06-27 DIAGNOSIS — N138 Other obstructive and reflux uropathy: Secondary | ICD-10-CM | POA: Diagnosis not present

## 2022-06-27 DIAGNOSIS — E1169 Type 2 diabetes mellitus with other specified complication: Secondary | ICD-10-CM | POA: Diagnosis not present

## 2022-07-04 ENCOUNTER — Other Ambulatory Visit (HOSPITAL_COMMUNITY): Payer: Self-pay

## 2022-07-04 DIAGNOSIS — N400 Enlarged prostate without lower urinary tract symptoms: Secondary | ICD-10-CM | POA: Diagnosis not present

## 2022-07-04 DIAGNOSIS — E118 Type 2 diabetes mellitus with unspecified complications: Secondary | ICD-10-CM | POA: Diagnosis not present

## 2022-07-04 DIAGNOSIS — K219 Gastro-esophageal reflux disease without esophagitis: Secondary | ICD-10-CM | POA: Diagnosis not present

## 2022-07-04 DIAGNOSIS — H9190 Unspecified hearing loss, unspecified ear: Secondary | ICD-10-CM | POA: Diagnosis not present

## 2022-07-04 DIAGNOSIS — Z87891 Personal history of nicotine dependence: Secondary | ICD-10-CM | POA: Diagnosis not present

## 2022-07-04 DIAGNOSIS — K869 Disease of pancreas, unspecified: Secondary | ICD-10-CM | POA: Diagnosis not present

## 2022-07-09 ENCOUNTER — Encounter: Payer: Self-pay | Admitting: Podiatry

## 2022-07-09 ENCOUNTER — Other Ambulatory Visit (HOSPITAL_COMMUNITY): Payer: Self-pay

## 2022-07-09 ENCOUNTER — Ambulatory Visit (INDEPENDENT_AMBULATORY_CARE_PROVIDER_SITE_OTHER): Payer: PPO | Admitting: Podiatry

## 2022-07-09 DIAGNOSIS — B351 Tinea unguium: Secondary | ICD-10-CM | POA: Diagnosis not present

## 2022-07-09 DIAGNOSIS — M79675 Pain in left toe(s): Secondary | ICD-10-CM

## 2022-07-09 DIAGNOSIS — E119 Type 2 diabetes mellitus without complications: Secondary | ICD-10-CM | POA: Diagnosis not present

## 2022-07-09 DIAGNOSIS — M79674 Pain in right toe(s): Secondary | ICD-10-CM

## 2022-07-09 NOTE — Progress Notes (Signed)
Subjective: Chief Complaint  Patient presents with   Debridement    Trim toenails     89 y.o. returns the office today for painful, elongated, thickened toenails which he cannot trim himself.  States that the right big toenail where he had ingrown toenail is doing much better.  No swelling or redness or any drainage around the toenails. He has dry skin on the heels and asking what he can put on them.   PCP: Geoffry Paradise, MD Last seen 06/27/2022  Last A1c 6.6 on 06/07/2022  Objective: AAO 3, NAD DP/PT pulses palpable, CRT less than 3 seconds Nails hypertrophic, dystrophic, elongated, brittle, discolored 10.  Incurvation of the hallux toenails.  There is tenderness overlying the nails 1-5 bilaterally. There is no surrounding erythema or drainage along the nail sites.  Incurvation present along the right lateral nail border.  Limited, erythema or signs of infection. No significant callus formation.  Dry skin present bilateral feet. No pain with calf compression, warmth, erythema.  Assessment: Patient presents with symptomatic onychomycosis  Plan: -Treatment options including alternatives, risks, complications were discussed -Nails sharply debrided 10 .  Dry skin present distal portion of the toes.  No bleeding occurred during the debridement but the distal left fourth toenail where there was dry skin is "raw".  Antibiotic ointment and bandage applied.  Discussed signs of infection on the nose should any occur. -Discussed various moisturizers for his feet. -Monitor for any clinical signs or symptoms of infection and directed to call the office immediately should any occur or go to the ER.  Return in about 3 months (around 10/09/2022).  Ovid Curd, DPM

## 2022-07-10 ENCOUNTER — Other Ambulatory Visit (HOSPITAL_COMMUNITY): Payer: Self-pay

## 2022-07-10 ENCOUNTER — Other Ambulatory Visit: Payer: Self-pay

## 2022-07-10 MED ORDER — CREON 3000-9500 UNITS PO CPEP
2.0000 | ORAL_CAPSULE | Freq: Three times a day (TID) | ORAL | 2 refills | Status: DC
  Filled 2022-07-10 – 2022-07-20 (×3): qty 180, 30d supply, fill #0
  Filled 2022-08-15: qty 180, 30d supply, fill #1
  Filled 2022-09-26: qty 140, 24d supply, fill #2

## 2022-07-11 ENCOUNTER — Other Ambulatory Visit: Payer: Self-pay

## 2022-07-12 ENCOUNTER — Other Ambulatory Visit (HOSPITAL_COMMUNITY): Payer: Self-pay

## 2022-07-16 ENCOUNTER — Other Ambulatory Visit (HOSPITAL_COMMUNITY): Payer: Self-pay

## 2022-07-16 ENCOUNTER — Other Ambulatory Visit: Payer: Self-pay

## 2022-07-17 ENCOUNTER — Other Ambulatory Visit: Payer: Self-pay

## 2022-07-17 ENCOUNTER — Encounter: Payer: Self-pay | Admitting: Pharmacist

## 2022-07-17 ENCOUNTER — Other Ambulatory Visit (HOSPITAL_COMMUNITY): Payer: Self-pay

## 2022-07-17 DIAGNOSIS — N3945 Continuous leakage: Secondary | ICD-10-CM | POA: Diagnosis not present

## 2022-07-17 DIAGNOSIS — R8271 Bacteriuria: Secondary | ICD-10-CM | POA: Diagnosis not present

## 2022-07-17 MED ORDER — SULFAMETHOXAZOLE-TRIMETHOPRIM 800-160 MG PO TABS
1.0000 | ORAL_TABLET | Freq: Two times a day (BID) | ORAL | 0 refills | Status: DC
  Filled 2022-07-17: qty 14, 7d supply, fill #0

## 2022-07-20 ENCOUNTER — Other Ambulatory Visit (HOSPITAL_COMMUNITY): Payer: Self-pay

## 2022-07-20 ENCOUNTER — Other Ambulatory Visit: Payer: Self-pay

## 2022-07-20 MED ORDER — CEPHALEXIN 500 MG PO CAPS
500.0000 mg | ORAL_CAPSULE | Freq: Two times a day (BID) | ORAL | 0 refills | Status: DC
  Filled 2022-07-20 (×2): qty 14, 7d supply, fill #0

## 2022-07-31 DIAGNOSIS — N3000 Acute cystitis without hematuria: Secondary | ICD-10-CM | POA: Diagnosis not present

## 2022-08-15 ENCOUNTER — Other Ambulatory Visit: Payer: Self-pay

## 2022-08-15 ENCOUNTER — Other Ambulatory Visit (HOSPITAL_COMMUNITY): Payer: Self-pay

## 2022-08-16 ENCOUNTER — Other Ambulatory Visit: Payer: Self-pay

## 2022-08-16 DIAGNOSIS — E785 Hyperlipidemia, unspecified: Secondary | ICD-10-CM | POA: Diagnosis not present

## 2022-08-16 DIAGNOSIS — E1169 Type 2 diabetes mellitus with other specified complication: Secondary | ICD-10-CM | POA: Diagnosis not present

## 2022-08-16 DIAGNOSIS — K760 Fatty (change of) liver, not elsewhere classified: Secondary | ICD-10-CM | POA: Diagnosis not present

## 2022-08-24 DIAGNOSIS — W19XXXA Unspecified fall, initial encounter: Secondary | ICD-10-CM | POA: Diagnosis not present

## 2022-08-24 DIAGNOSIS — S0181XA Laceration without foreign body of other part of head, initial encounter: Secondary | ICD-10-CM | POA: Diagnosis not present

## 2022-08-30 DIAGNOSIS — S0080XD Unspecified superficial injury of other part of head, subsequent encounter: Secondary | ICD-10-CM | POA: Diagnosis not present

## 2022-09-06 ENCOUNTER — Other Ambulatory Visit (HOSPITAL_COMMUNITY): Payer: Self-pay

## 2022-09-06 ENCOUNTER — Other Ambulatory Visit (HOSPITAL_BASED_OUTPATIENT_CLINIC_OR_DEPARTMENT_OTHER): Payer: Self-pay

## 2022-09-06 DIAGNOSIS — E1169 Type 2 diabetes mellitus with other specified complication: Secondary | ICD-10-CM | POA: Diagnosis not present

## 2022-09-06 DIAGNOSIS — I872 Venous insufficiency (chronic) (peripheral): Secondary | ICD-10-CM | POA: Diagnosis not present

## 2022-09-06 MED ORDER — TORSEMIDE 20 MG PO TABS
20.0000 mg | ORAL_TABLET | Freq: Every day | ORAL | 3 refills | Status: DC
  Filled 2022-09-06: qty 90, 90d supply, fill #0
  Filled 2022-09-07: qty 40, 40d supply, fill #0
  Filled 2022-09-07: qty 90, 90d supply, fill #0
  Filled 2022-09-07: qty 40, 40d supply, fill #0
  Filled 2022-11-06: qty 80, 80d supply, fill #1
  Filled 2023-02-21: qty 80, 80d supply, fill #2
  Filled 2023-06-18: qty 80, 80d supply, fill #3

## 2022-09-06 MED ORDER — TRIAMCINOLONE ACETONIDE 0.1 % EX CREA
1.0000 | TOPICAL_CREAM | Freq: Three times a day (TID) | CUTANEOUS | 0 refills | Status: DC | PRN
  Filled 2022-09-06: qty 454, 30d supply, fill #0
  Filled 2022-09-07: qty 454, 90d supply, fill #0

## 2022-09-07 ENCOUNTER — Other Ambulatory Visit (HOSPITAL_COMMUNITY): Payer: Self-pay

## 2022-09-07 ENCOUNTER — Other Ambulatory Visit: Payer: Self-pay

## 2022-09-10 ENCOUNTER — Other Ambulatory Visit (HOSPITAL_COMMUNITY): Payer: Self-pay

## 2022-09-14 DIAGNOSIS — E1169 Type 2 diabetes mellitus with other specified complication: Secondary | ICD-10-CM | POA: Diagnosis not present

## 2022-09-14 DIAGNOSIS — I872 Venous insufficiency (chronic) (peripheral): Secondary | ICD-10-CM | POA: Diagnosis not present

## 2022-09-18 ENCOUNTER — Other Ambulatory Visit (HOSPITAL_COMMUNITY): Payer: Self-pay

## 2022-09-25 DIAGNOSIS — N281 Cyst of kidney, acquired: Secondary | ICD-10-CM | POA: Diagnosis not present

## 2022-09-25 DIAGNOSIS — N5201 Erectile dysfunction due to arterial insufficiency: Secondary | ICD-10-CM | POA: Diagnosis not present

## 2022-09-25 DIAGNOSIS — R338 Other retention of urine: Secondary | ICD-10-CM | POA: Diagnosis not present

## 2022-09-25 DIAGNOSIS — N200 Calculus of kidney: Secondary | ICD-10-CM | POA: Diagnosis not present

## 2022-09-26 ENCOUNTER — Other Ambulatory Visit (HOSPITAL_COMMUNITY): Payer: Self-pay

## 2022-09-27 ENCOUNTER — Other Ambulatory Visit: Payer: Self-pay

## 2022-09-27 DIAGNOSIS — L578 Other skin changes due to chronic exposure to nonionizing radiation: Secondary | ICD-10-CM | POA: Diagnosis not present

## 2022-09-27 DIAGNOSIS — L821 Other seborrheic keratosis: Secondary | ICD-10-CM | POA: Diagnosis not present

## 2022-09-27 DIAGNOSIS — L570 Actinic keratosis: Secondary | ICD-10-CM | POA: Diagnosis not present

## 2022-09-27 DIAGNOSIS — R159 Full incontinence of feces: Secondary | ICD-10-CM | POA: Diagnosis not present

## 2022-09-27 DIAGNOSIS — M6281 Muscle weakness (generalized): Secondary | ICD-10-CM | POA: Diagnosis not present

## 2022-10-01 ENCOUNTER — Other Ambulatory Visit (HOSPITAL_COMMUNITY): Payer: Self-pay

## 2022-10-09 ENCOUNTER — Other Ambulatory Visit: Payer: Self-pay

## 2022-10-09 ENCOUNTER — Ambulatory Visit (INDEPENDENT_AMBULATORY_CARE_PROVIDER_SITE_OTHER): Payer: PPO | Admitting: Podiatry

## 2022-10-09 ENCOUNTER — Other Ambulatory Visit (HOSPITAL_COMMUNITY): Payer: Self-pay

## 2022-10-09 ENCOUNTER — Encounter: Payer: Self-pay | Admitting: Podiatry

## 2022-10-09 DIAGNOSIS — M79675 Pain in left toe(s): Secondary | ICD-10-CM

## 2022-10-09 DIAGNOSIS — E119 Type 2 diabetes mellitus without complications: Secondary | ICD-10-CM | POA: Diagnosis not present

## 2022-10-09 DIAGNOSIS — M79674 Pain in right toe(s): Secondary | ICD-10-CM

## 2022-10-09 DIAGNOSIS — B351 Tinea unguium: Secondary | ICD-10-CM | POA: Diagnosis not present

## 2022-10-09 DIAGNOSIS — L03115 Cellulitis of right lower limb: Secondary | ICD-10-CM | POA: Diagnosis not present

## 2022-10-09 MED ORDER — CEPHALEXIN 500 MG PO CAPS: 500.0000 mg | ORAL_CAPSULE | Freq: Three times a day (TID) | ORAL | 0 refills | Status: DC

## 2022-10-09 MED ORDER — CEPHALEXIN 500 MG PO CAPS
500.0000 mg | ORAL_CAPSULE | Freq: Three times a day (TID) | ORAL | 0 refills | Status: DC
  Filled 2022-10-09 – 2023-01-27 (×2): qty 21, 7d supply, fill #0

## 2022-10-09 NOTE — Progress Notes (Signed)
Subjective: Chief Complaint  Patient presents with   Debridement    Trim toenails-diabetic - last A1c 7.7   The patient presents for routine foot care due to difficulty managing hypertrophic, dystrophic nails that are causing pain. The patient also reports a sore spot on the lateral aspect of the right foot, specifically at the base of the fifth metatarsal. This spot has been present for several weeks and is most painful when pressure is applied during sleep. There has been no reported opening, drainage, or swelling at this site. The patient recently experienced a fall, resulting in a head injury, but reports no injuries to the lower extremities.  Objective: AAO x3, NAD DP/PT pulses palpable bilaterally, CRT less than 3 seconds Nails are hypertrophic, dystrophic, brittle, discolored, elongated 10. No surrounding redness or drainage. Tenderness nails 1-5 bilaterally. No open lesions or pre-ulcerative lesions are identified today. To the lateral aspect the right foot on the fifth metatarsal base is a small scab without any drainage or pus.  There is localized erythema without any ascending cellulitis.  There is no fluctuation or crepitation but there is no malodor. No pain with calf compression, swelling, warmth, erythema  Assessment: Symptomatic onychomycosis, localized cellulitis right foot  Plan: Onychomycosis Hypertrophic, dystrophic nails with yellow-brown discoloration and subungual debris. -Trimmed nails without complications or bleeding.  Pre-ulcerative lesion, right foot Localized erythema and edema at the right fifth metatarsal base with a small scab present. No drainage, fluctuance, or malodor. -Prescribe Keflex 500mg  TID. -Apply a small amount of antibiotic ointment and a Band-Aid during the day. -Offloading. -Advised to perform daily foot inspection and report any signs or symptoms of worsening infection to the emergency room or the office. -Follow-up in 2-3 weeks to check on  the lesion.  Routine foot care -Return in 3 months for routine foot care.  Vivi Barrack DPM

## 2022-10-09 NOTE — Patient Instructions (Signed)
Monitor for any signs/symptoms of infection. Call the office immediately if any occur or go directly to the emergency room. Call with any questions/concerns.  

## 2022-10-10 ENCOUNTER — Other Ambulatory Visit (HOSPITAL_COMMUNITY): Payer: Self-pay

## 2022-10-11 ENCOUNTER — Other Ambulatory Visit (HOSPITAL_COMMUNITY): Payer: Self-pay

## 2022-10-11 ENCOUNTER — Other Ambulatory Visit: Payer: Self-pay

## 2022-10-11 MED ORDER — FUROSEMIDE 20 MG PO TABS
40.0000 mg | ORAL_TABLET | Freq: Every day | ORAL | 1 refills | Status: DC
  Filled 2022-10-11 – 2023-01-27 (×2): qty 180, 90d supply, fill #0

## 2022-10-12 ENCOUNTER — Other Ambulatory Visit (HOSPITAL_COMMUNITY): Payer: Self-pay

## 2022-10-12 MED ORDER — TORSEMIDE 20 MG PO TABS
20.0000 mg | ORAL_TABLET | Freq: Every day | ORAL | 3 refills | Status: DC
  Filled 2022-10-12: qty 90, 90d supply, fill #0

## 2022-10-15 ENCOUNTER — Other Ambulatory Visit (HOSPITAL_COMMUNITY): Payer: Self-pay

## 2022-10-16 ENCOUNTER — Other Ambulatory Visit (HOSPITAL_COMMUNITY): Payer: Self-pay

## 2022-10-16 ENCOUNTER — Other Ambulatory Visit: Payer: Self-pay

## 2022-10-16 MED ORDER — CREON 3000-9500 UNITS PO CPEP
6000.0000 [IU] | ORAL_CAPSULE | Freq: Three times a day (TID) | ORAL | 2 refills | Status: DC
  Filled 2022-10-16: qty 140, 24d supply, fill #0
  Filled 2022-11-06: qty 140, 24d supply, fill #1
  Filled 2022-11-30: qty 490, 82d supply, fill #2
  Filled 2022-12-01: qty 140, 24d supply, fill #2
  Filled 2023-01-07: qty 140, 24d supply, fill #3
  Filled 2023-01-27: qty 140, 24d supply, fill #4
  Filled 2023-02-21: qty 140, 24d supply, fill #5
  Filled 2023-03-16: qty 140, 24d supply, fill #6
  Filled 2023-04-23: qty 140, 24d supply, fill #7
  Filled 2023-05-14: qty 140, 24d supply, fill #8
  Filled 2023-06-18: qty 140, 24d supply, fill #9
  Filled 2023-07-08: qty 140, 24d supply, fill #10
  Filled 2023-09-16: qty 70, 12d supply, fill #11

## 2022-10-17 ENCOUNTER — Other Ambulatory Visit: Payer: Self-pay

## 2022-10-29 ENCOUNTER — Ambulatory Visit (INDEPENDENT_AMBULATORY_CARE_PROVIDER_SITE_OTHER): Payer: PPO

## 2022-10-29 ENCOUNTER — Ambulatory Visit (INDEPENDENT_AMBULATORY_CARE_PROVIDER_SITE_OTHER): Payer: PPO | Admitting: Podiatry

## 2022-10-29 ENCOUNTER — Encounter: Payer: Self-pay | Admitting: Podiatry

## 2022-10-29 VITALS — Ht 68.0 in | Wt 127.0 lb

## 2022-10-29 DIAGNOSIS — L03115 Cellulitis of right lower limb: Secondary | ICD-10-CM

## 2022-10-29 NOTE — Addendum Note (Signed)
Addended by: Ovid Curd R on: 10/29/2022 12:13 PM   Modules accepted: Orders

## 2022-10-29 NOTE — Progress Notes (Signed)
Subjective: Chief Complaint  Patient presents with   Foot Pain    PATIENT STATES THAT HE DOESN'T WANT TO GIVE ANY INFORMATION ABOUT HIS FOOT    89 year old male presents the office today with above concerns.  He finished the antibiotics.  Been keeping a bandage on the area for protection.  He has not seen any drainage on the Band-Aid.  No fevers or chills he reports.  No new issues.   Objective: AAO x3, NAD DP/PT pulses palpable bilaterally, CRT less than 3 seconds On the right fifth metatarsal base localized area of erythema on the prominence metatarsal base.  There is no open lesions there is no drainage or pus.  There is no increased temperature.  There is no fluctuation or crepitation. No pain with calf compression, swelling, warmth, erythema  Assessment: 89 year old male with this metatarsal base resolving infection  Plan: -All treatment options discussed with the patient including all alternatives, risks, complications.  -Findings once more from irritation, rubbing inside shoes.  Continue daily for protection also made him a horseshoe pad to help offload.  Monitoring signs or symptoms of infection or skin breakdown.  The erythema is not resolving next week to let me understand there is any worsening.  I will see back at his routine appointment. -Patient encouraged to call the office with any questions, concerns, change in symptoms.   Vivi Barrack DPM

## 2022-10-30 ENCOUNTER — Ambulatory Visit: Payer: PPO | Admitting: Podiatry

## 2022-10-30 DIAGNOSIS — Z90411 Acquired partial absence of pancreas: Secondary | ICD-10-CM | POA: Diagnosis not present

## 2022-10-30 DIAGNOSIS — R197 Diarrhea, unspecified: Secondary | ICD-10-CM | POA: Diagnosis not present

## 2022-10-30 DIAGNOSIS — D490 Neoplasm of unspecified behavior of digestive system: Secondary | ICD-10-CM | POA: Diagnosis not present

## 2022-10-30 DIAGNOSIS — Z87891 Personal history of nicotine dependence: Secondary | ICD-10-CM | POA: Diagnosis not present

## 2022-11-06 ENCOUNTER — Other Ambulatory Visit (HOSPITAL_COMMUNITY): Payer: Self-pay

## 2022-11-06 ENCOUNTER — Other Ambulatory Visit: Payer: Self-pay

## 2022-11-07 ENCOUNTER — Other Ambulatory Visit: Payer: Self-pay

## 2022-11-07 DIAGNOSIS — D692 Other nonthrombocytopenic purpura: Secondary | ICD-10-CM | POA: Diagnosis not present

## 2022-11-07 DIAGNOSIS — Z1331 Encounter for screening for depression: Secondary | ICD-10-CM | POA: Diagnosis not present

## 2022-11-07 DIAGNOSIS — Z23 Encounter for immunization: Secondary | ICD-10-CM | POA: Diagnosis not present

## 2022-11-07 DIAGNOSIS — E1169 Type 2 diabetes mellitus with other specified complication: Secondary | ICD-10-CM | POA: Diagnosis not present

## 2022-11-07 DIAGNOSIS — N1832 Chronic kidney disease, stage 3b: Secondary | ICD-10-CM | POA: Diagnosis not present

## 2022-11-07 DIAGNOSIS — N401 Enlarged prostate with lower urinary tract symptoms: Secondary | ICD-10-CM | POA: Diagnosis not present

## 2022-11-07 DIAGNOSIS — E119 Type 2 diabetes mellitus without complications: Secondary | ICD-10-CM | POA: Diagnosis not present

## 2022-11-07 DIAGNOSIS — R82998 Other abnormal findings in urine: Secondary | ICD-10-CM | POA: Diagnosis not present

## 2022-11-07 DIAGNOSIS — I872 Venous insufficiency (chronic) (peripheral): Secondary | ICD-10-CM | POA: Diagnosis not present

## 2022-11-07 DIAGNOSIS — C259 Malignant neoplasm of pancreas, unspecified: Secondary | ICD-10-CM | POA: Diagnosis not present

## 2022-11-07 DIAGNOSIS — K219 Gastro-esophageal reflux disease without esophagitis: Secondary | ICD-10-CM | POA: Diagnosis not present

## 2022-11-07 DIAGNOSIS — E785 Hyperlipidemia, unspecified: Secondary | ICD-10-CM | POA: Diagnosis not present

## 2022-11-07 DIAGNOSIS — R269 Unspecified abnormalities of gait and mobility: Secondary | ICD-10-CM | POA: Diagnosis not present

## 2022-11-07 DIAGNOSIS — Z1339 Encounter for screening examination for other mental health and behavioral disorders: Secondary | ICD-10-CM | POA: Diagnosis not present

## 2022-11-07 DIAGNOSIS — Z0000 Encounter for general adult medical examination without abnormal findings: Secondary | ICD-10-CM | POA: Diagnosis not present

## 2022-11-23 ENCOUNTER — Encounter: Payer: Self-pay | Admitting: Podiatry

## 2022-11-23 ENCOUNTER — Ambulatory Visit (INDEPENDENT_AMBULATORY_CARE_PROVIDER_SITE_OTHER): Payer: PPO | Admitting: Podiatry

## 2022-11-23 DIAGNOSIS — M778 Other enthesopathies, not elsewhere classified: Secondary | ICD-10-CM | POA: Diagnosis not present

## 2022-11-23 MED ORDER — TRIAMCINOLONE ACETONIDE 10 MG/ML IJ SUSP
10.0000 mg | Freq: Once | INTRAMUSCULAR | Status: AC
Start: 2022-11-23 — End: 2022-11-23
  Administered 2022-11-23: 10 mg via INTRA_ARTICULAR

## 2022-11-26 NOTE — Progress Notes (Signed)
Subjective:   Patient ID: Francisco [name], male   DOB: 89 y.o.   MRN: [id number]   HPI Patient presents with inflammation around the right lateral foot with fluid buildup and pain.  Currently states that he has trouble wearing shoe gear comfortably   ROS      Objective:  Physical Exam  Neurovascular status intact with an area of inflammation around the base of the fifth metatarsal right fluid buildup in the area localized no proximal edema edema drainage noted no drainage from the area     Assessment:  Appears to be more inflammatory with possibility for capsulitis like inflammation with pain     Plan:  H&P reviewed and at this point organ to try to reduce the inflammatory cycle needs in and I went ahead today and I did sterile prep I injected the fifth metatarsal base 3 mg dexamethasone Kenalog 5 mg Xylocaine and applied sterile dressing.  Hopefully this will eradicate the symptoms

## 2022-11-30 ENCOUNTER — Other Ambulatory Visit (HOSPITAL_COMMUNITY): Payer: Self-pay

## 2022-12-01 ENCOUNTER — Other Ambulatory Visit (HOSPITAL_COMMUNITY): Payer: Self-pay

## 2022-12-01 ENCOUNTER — Other Ambulatory Visit: Payer: Self-pay

## 2022-12-03 ENCOUNTER — Other Ambulatory Visit: Payer: Self-pay

## 2022-12-04 ENCOUNTER — Other Ambulatory Visit: Payer: Self-pay

## 2022-12-20 ENCOUNTER — Other Ambulatory Visit (HOSPITAL_COMMUNITY): Payer: Self-pay

## 2022-12-24 ENCOUNTER — Other Ambulatory Visit (HOSPITAL_COMMUNITY): Payer: Self-pay

## 2022-12-24 ENCOUNTER — Other Ambulatory Visit (HOSPITAL_BASED_OUTPATIENT_CLINIC_OR_DEPARTMENT_OTHER): Payer: Self-pay

## 2022-12-24 MED ORDER — FINASTERIDE 5 MG PO TABS
5.0000 mg | ORAL_TABLET | Freq: Every day | ORAL | 3 refills | Status: DC
  Filled 2022-12-24: qty 90, 90d supply, fill #0
  Filled 2023-03-16: qty 90, 90d supply, fill #1
  Filled 2023-07-15: qty 90, 90d supply, fill #2

## 2022-12-25 ENCOUNTER — Other Ambulatory Visit: Payer: Self-pay

## 2023-01-07 ENCOUNTER — Other Ambulatory Visit (HOSPITAL_COMMUNITY): Payer: Self-pay

## 2023-01-07 ENCOUNTER — Other Ambulatory Visit: Payer: Self-pay

## 2023-01-08 ENCOUNTER — Encounter: Payer: Self-pay | Admitting: Podiatry

## 2023-01-08 ENCOUNTER — Other Ambulatory Visit: Payer: Self-pay

## 2023-01-08 ENCOUNTER — Other Ambulatory Visit (HOSPITAL_COMMUNITY): Payer: Self-pay

## 2023-01-08 ENCOUNTER — Ambulatory Visit (INDEPENDENT_AMBULATORY_CARE_PROVIDER_SITE_OTHER): Payer: PPO | Admitting: Podiatry

## 2023-01-08 DIAGNOSIS — E119 Type 2 diabetes mellitus without complications: Secondary | ICD-10-CM

## 2023-01-08 DIAGNOSIS — M79674 Pain in right toe(s): Secondary | ICD-10-CM

## 2023-01-08 DIAGNOSIS — B351 Tinea unguium: Secondary | ICD-10-CM | POA: Diagnosis not present

## 2023-01-08 DIAGNOSIS — M79675 Pain in left toe(s): Secondary | ICD-10-CM

## 2023-01-08 MED ORDER — JARDIANCE 10 MG PO TABS
10.0000 mg | ORAL_TABLET | Freq: Every day | ORAL | 3 refills | Status: DC
  Filled 2023-01-08: qty 90, 90d supply, fill #0
  Filled 2023-03-16 – 2023-07-15 (×2): qty 90, 90d supply, fill #1

## 2023-01-08 NOTE — Progress Notes (Signed)
 Subjective: Chief Complaint  Patient presents with   [facility]    RM#11 [facility] patient has no concerns other than nail trim.    89 year old male presents the office today with above concerns.  States his nails are thick and elongated he cannot trim himself because he discomfort.  He did see Dr. Magdalen last visit for an injection on the right foot which did help but still notes some redness and pain to the side of the foot.  No drainage.  He is doing better he reports.  No fevers or chills.    Objective: AAO x3, NAD DP/PT pulses palpable bilaterally, CRT less than 3 seconds On the right fifth metatarsal base localized area of erythema on the prominence metatarsal base.  There is a preulcerative area noted.  There is no fluctuance or crepitation.  There is no malodor. Nails are hypertrophic, dystrophic, brittle, discolored, elongated 10. No surrounding redness or drainage. Tenderness nails 1-5 bilaterally. No open lesions or pre-ulcerative lesions are identified today. No pain with calf compression, swelling, warmth, erythema  Assessment: 89 year old male with symptomatic onychosis, preulcerative lesion right fifth metatarsal base  Plan: -All treatment options discussed with the patient including all alternatives, risks, complications.  -Sharply debrided nails x 10 without any complication. -I would advise against another injection at this time as the area looks like it is preulcerative.  I keep smaller antibiotic ointment and a bandage may be offloading.  If symptoms do not resolve the next couple weeks to let me know or sooner for any worsening or changes.  Return in about 9 weeks (around 03/12/2023) for routine care.  Donnice JONELLE Fees DPM

## 2023-01-09 ENCOUNTER — Ambulatory Visit: Payer: PPO | Admitting: Podiatry

## 2023-01-15 DIAGNOSIS — L820 Inflamed seborrheic keratosis: Secondary | ICD-10-CM | POA: Diagnosis not present

## 2023-01-15 DIAGNOSIS — L821 Other seborrheic keratosis: Secondary | ICD-10-CM | POA: Diagnosis not present

## 2023-01-15 DIAGNOSIS — L570 Actinic keratosis: Secondary | ICD-10-CM | POA: Diagnosis not present

## 2023-01-15 DIAGNOSIS — L853 Xerosis cutis: Secondary | ICD-10-CM | POA: Diagnosis not present

## 2023-01-27 ENCOUNTER — Other Ambulatory Visit (HOSPITAL_COMMUNITY): Payer: Self-pay

## 2023-01-28 ENCOUNTER — Other Ambulatory Visit: Payer: Self-pay

## 2023-01-29 ENCOUNTER — Other Ambulatory Visit: Payer: Self-pay

## 2023-01-30 ENCOUNTER — Other Ambulatory Visit (HOSPITAL_COMMUNITY): Payer: Self-pay

## 2023-01-30 DIAGNOSIS — I872 Venous insufficiency (chronic) (peripheral): Secondary | ICD-10-CM | POA: Diagnosis not present

## 2023-01-30 DIAGNOSIS — N1832 Chronic kidney disease, stage 3b: Secondary | ICD-10-CM | POA: Diagnosis not present

## 2023-01-30 DIAGNOSIS — E1169 Type 2 diabetes mellitus with other specified complication: Secondary | ICD-10-CM | POA: Diagnosis not present

## 2023-01-30 DIAGNOSIS — E785 Hyperlipidemia, unspecified: Secondary | ICD-10-CM | POA: Diagnosis not present

## 2023-01-30 DIAGNOSIS — K760 Fatty (change of) liver, not elsewhere classified: Secondary | ICD-10-CM | POA: Diagnosis not present

## 2023-02-01 DIAGNOSIS — H9113 Presbycusis, bilateral: Secondary | ICD-10-CM | POA: Diagnosis not present

## 2023-02-01 DIAGNOSIS — H6123 Impacted cerumen, bilateral: Secondary | ICD-10-CM | POA: Diagnosis not present

## 2023-02-07 DIAGNOSIS — H903 Sensorineural hearing loss, bilateral: Secondary | ICD-10-CM | POA: Diagnosis not present

## 2023-02-21 ENCOUNTER — Other Ambulatory Visit (HOSPITAL_COMMUNITY): Payer: Self-pay

## 2023-02-21 ENCOUNTER — Other Ambulatory Visit: Payer: Self-pay

## 2023-02-21 DIAGNOSIS — Z8546 Personal history of malignant neoplasm of prostate: Secondary | ICD-10-CM | POA: Diagnosis not present

## 2023-02-21 DIAGNOSIS — I872 Venous insufficiency (chronic) (peripheral): Secondary | ICD-10-CM | POA: Diagnosis not present

## 2023-02-21 DIAGNOSIS — R350 Frequency of micturition: Secondary | ICD-10-CM | POA: Diagnosis not present

## 2023-02-21 DIAGNOSIS — N1832 Chronic kidney disease, stage 3b: Secondary | ICD-10-CM | POA: Diagnosis not present

## 2023-02-21 DIAGNOSIS — Z9079 Acquired absence of other genital organ(s): Secondary | ICD-10-CM | POA: Diagnosis not present

## 2023-02-22 ENCOUNTER — Other Ambulatory Visit: Payer: Self-pay

## 2023-03-14 ENCOUNTER — Ambulatory Visit (INDEPENDENT_AMBULATORY_CARE_PROVIDER_SITE_OTHER): Payer: PPO | Admitting: Podiatry

## 2023-03-14 ENCOUNTER — Encounter: Payer: Self-pay | Admitting: Podiatry

## 2023-03-14 DIAGNOSIS — I872 Venous insufficiency (chronic) (peripheral): Secondary | ICD-10-CM

## 2023-03-14 DIAGNOSIS — M79675 Pain in left toe(s): Secondary | ICD-10-CM | POA: Diagnosis not present

## 2023-03-14 DIAGNOSIS — E119 Type 2 diabetes mellitus without complications: Secondary | ICD-10-CM

## 2023-03-14 DIAGNOSIS — B351 Tinea unguium: Secondary | ICD-10-CM

## 2023-03-14 DIAGNOSIS — M79674 Pain in right toe(s): Secondary | ICD-10-CM

## 2023-03-14 DIAGNOSIS — R2243 Localized swelling, mass and lump, lower limb, bilateral: Secondary | ICD-10-CM

## 2023-03-14 NOTE — Progress Notes (Signed)
 Subjective: Chief Complaint  Patient presents with   [facility]    RM#DFC follow up on thick nails.    89 year old male presents the office today with above concerns.  States his nails are thick and elongated he cannot trim himself because he discomfort.  Keeps a bandage of the plantar aspect the right foot which does help.  Pain to the area is improved.  He has no new concerns today.   Objective: AAO x3, NAD DP/PT pulses palpable bilaterally, CRT less than 3 seconds On the right fifth metatarsal base localized area of erythema likely more from inflammation, rubbing as opposed to infection.  This is on the area of the prominent metatarsal base.  There is no open lesions identified today. On the dorsal aspect the left foot is annular dry, scaly, erythematous patch with some flaky skin present.  This has been ongoing now for many months. Nails are hypertrophic, dystrophic, brittle, discolored, elongated 10. No surrounding redness or drainage. Tenderness nails 1-5 bilaterally. No open lesions or pre-ulcerative lesions are identified today. No pain with calf compression, swelling, warmth, erythema  Assessment: 89 year old male with symptomatic onychosis, preulcerative lesion right fifth metatarsal base  Plan: -All treatment options discussed with the patient including all alternatives, risks, complications.  -Sharply debrided nails x 10 without any complication. -He has chronic edema- order venous reflux study.  -For the skin lesion the dorsal aspect left foot he has a dermatology appointment coming up and recommended evaluation of this as well.  He states he was to the dermatologist. -Continue offloading for the right fifth metatarsal base.  Monitoring signs or symptoms of infection or skin breakdown.  Return in about 3 months (around 06/14/2023).  Vivi Barrack DPM

## 2023-03-16 ENCOUNTER — Other Ambulatory Visit (HOSPITAL_COMMUNITY): Payer: Self-pay

## 2023-03-18 ENCOUNTER — Telehealth: Payer: Self-pay | Admitting: Podiatry

## 2023-03-18 NOTE — Telephone Encounter (Signed)
 As per Patient, he was expecting to hear back from provider or nurse regarding recommendation offered by Dr. Ardelle Anton for [facility]ist. Patient would like to receive a call. Patient contact telephone, [phone]

## 2023-03-18 NOTE — Addendum Note (Signed)
 Addended by: Ovid Curd R on: 03/18/2023 12:06 PM   Modules accepted: Orders

## 2023-03-19 ENCOUNTER — Telehealth (HOSPITAL_COMMUNITY): Payer: Self-pay

## 2023-03-19 ENCOUNTER — Other Ambulatory Visit: Payer: Self-pay

## 2023-03-19 ENCOUNTER — Other Ambulatory Visit (HOSPITAL_COMMUNITY): Payer: Self-pay

## 2023-03-19 NOTE — Telephone Encounter (Signed)
 Attempted to contact the patient to schedule VAS [place].  Yes answer.  Answered, Scheduled.  First Attempt. Provided direct contact number for scheduling: [phone].

## 2023-03-21 IMAGING — CT CT ABD-PELV W/O CM
2 of 4 series · 17 of 46 positions shown, 19 images · non-contrast
Comparison: CT 05/14/2012, 08/12/2019

CLINICAL DATA: Nausea vomiting

EXAM:
CT ABDOMEN AND PELVIS WITHOUT CONTRAST
TECHNIQUE: Multidetector CT imaging of the abdomen and pelvis was performed
following the standard protocol without IV contrast.

[Series 2: axial st · axial · 0.75mm/px · z∈[+1220,+1610]mm · 14 of 90 slices shown, 16 images]
[im 6/90  soft-tissue]
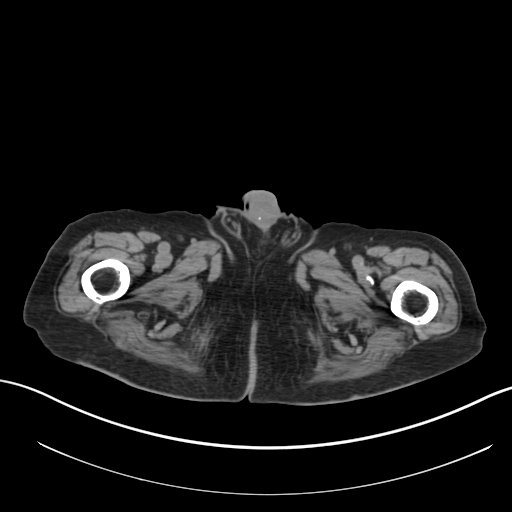
[im 6/90  bone]
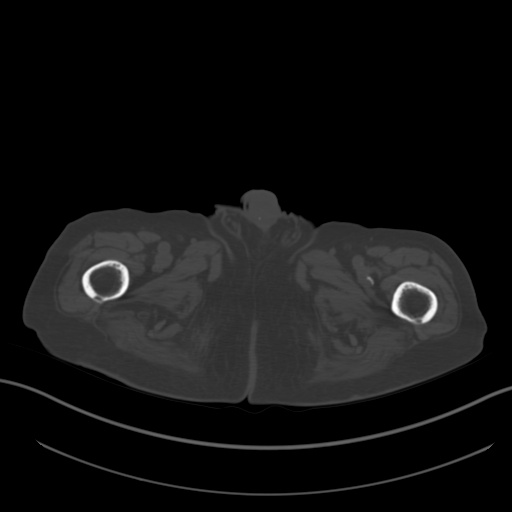
[im 11/90  soft-tissue]
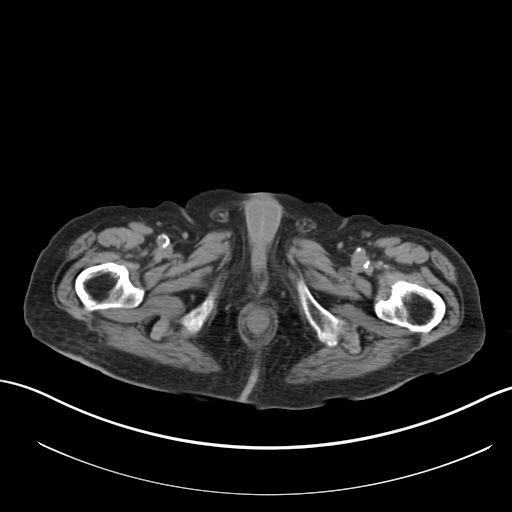
[im 16/90  soft-tissue]
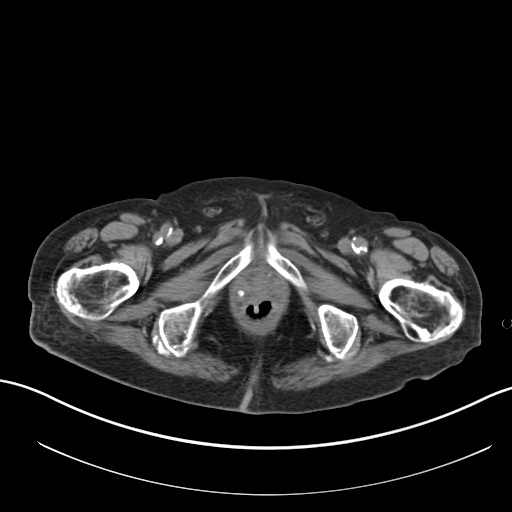
[im 27/90  soft-tissue]
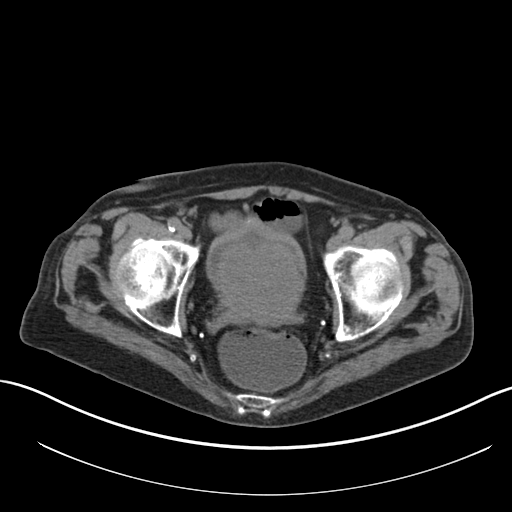
[im 32/90  soft-tissue]
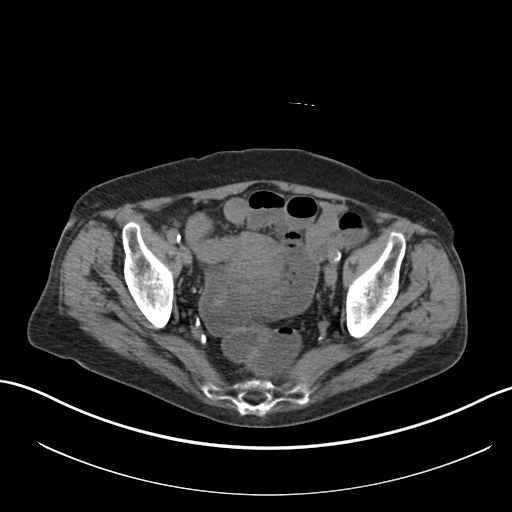
[im 37/90  soft-tissue]
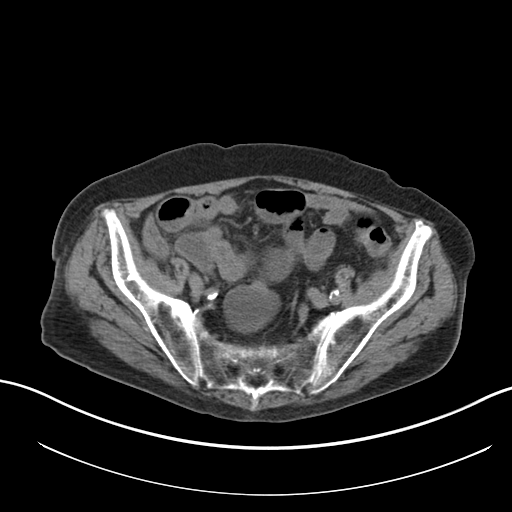
[im 42/90  soft-tissue]
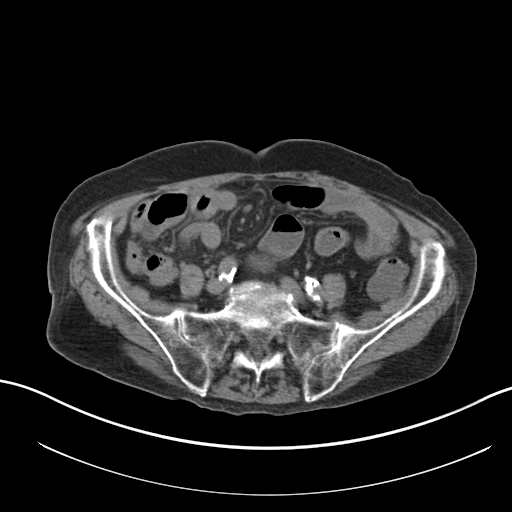
[im 48/90  soft-tissue]
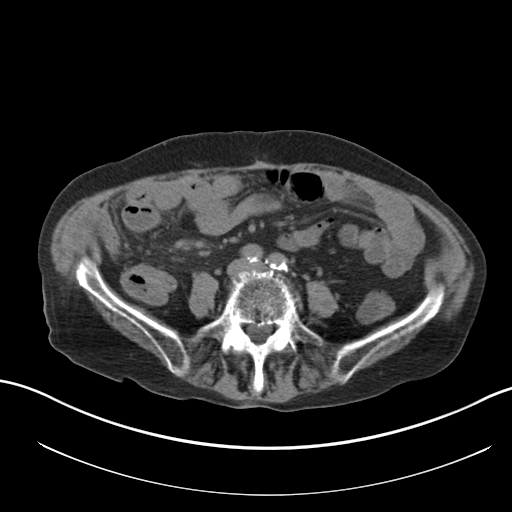
[im 53/90  soft-tissue]
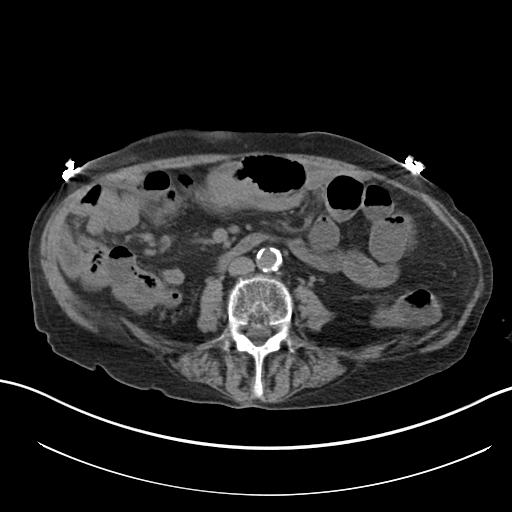
[im 53/90  bone]
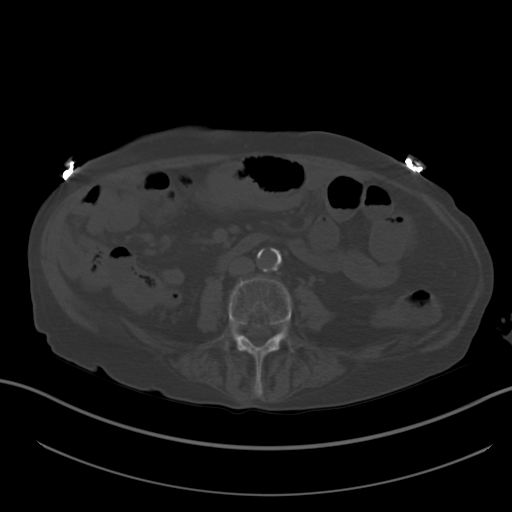
[im 58/90  soft-tissue]
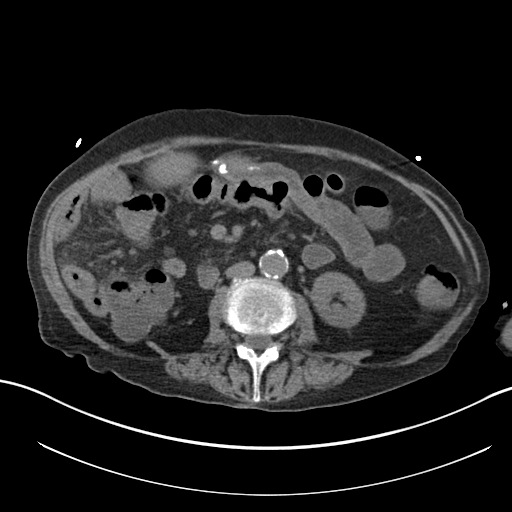
[im 69/90  soft-tissue]
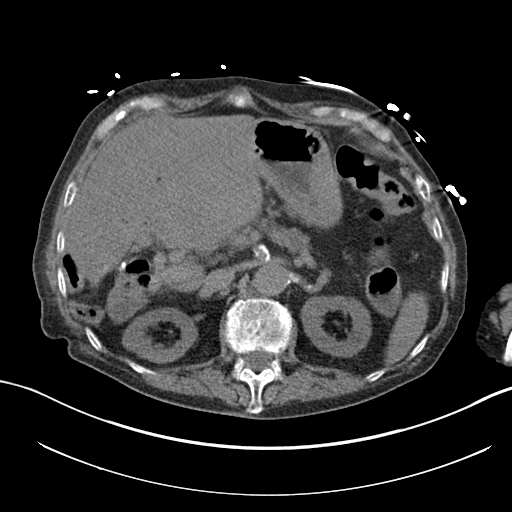
[im 74/90  soft-tissue]
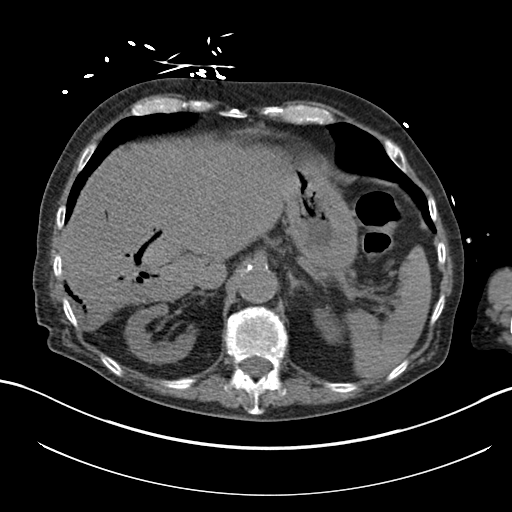
[im 79/90  soft-tissue]
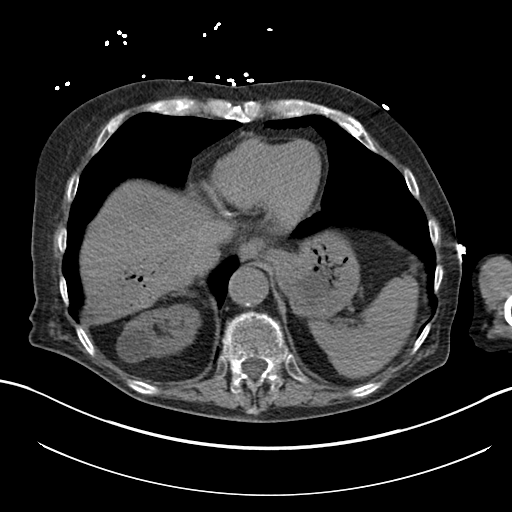
[im 84/90  soft-tissue]
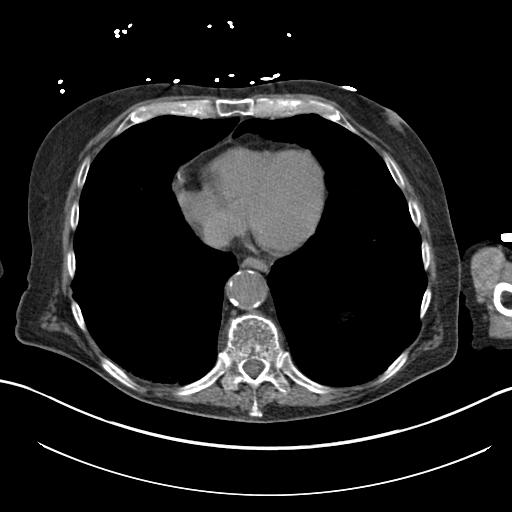

[Series 4: coronal st · coronal · 0.87mm/px · 3 of 130 slices shown]
[im 44/130  soft-tissue]
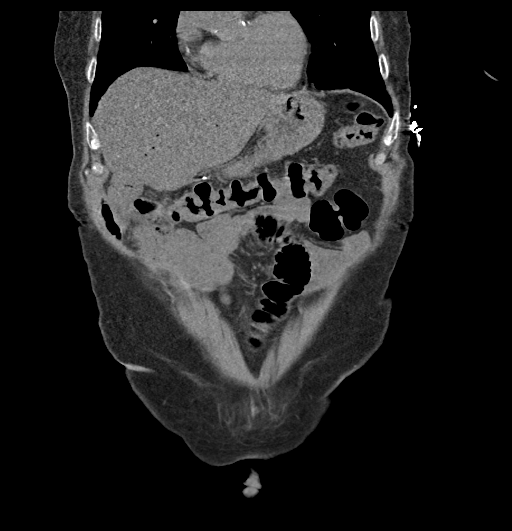
[im 58/130  soft-tissue]
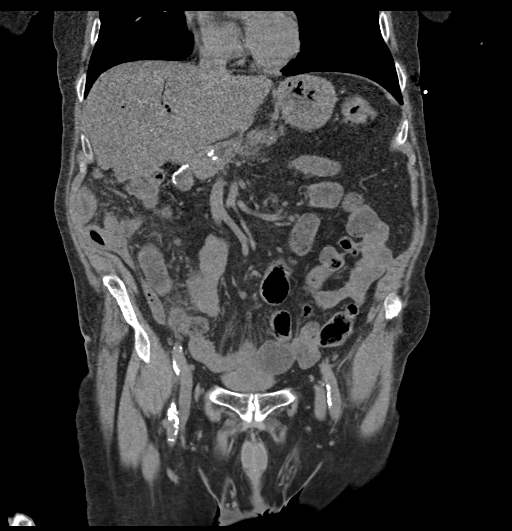
[im 72/130  soft-tissue]
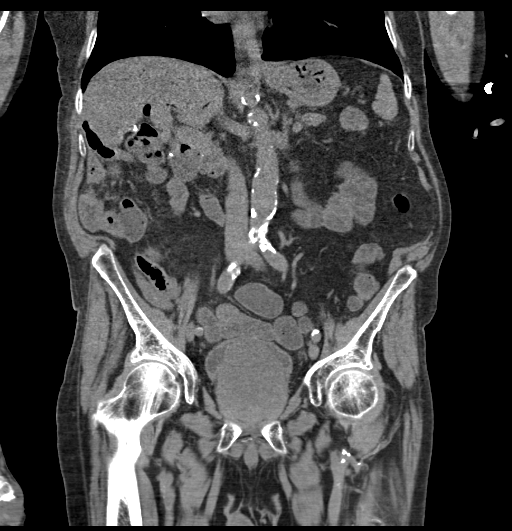

[17 of 46 positions shown; findings below may reference images not displayed]

FINDINGS: Lower chest: Lung bases demonstrate no acute consolidation or
effusion. Normal cardiac size.

Hepatobiliary: Previously noted left hepatic lobe cyst not well seen
today. Status post cholecystectomy. Lobulated liver contour.
Pneumobilia present today, not seen on 9695 comparison but present
on 8390 postoperative comparison. This is presumably related to
choledochal jejunostomy.

Pancreas: Status post partial pancreatectomy. No inflammatory
changes.

Spleen: Normal in size without focal abnormality.

Adrenals/Urinary Tract: Adrenal glands are within normal limits.
Cyst in the mid right kidney. No hydronephrosis. The bladder is
nearly empty and slightly thick-walled.

Stomach/Bowel: Distal gastrectomy. No dilated small or large bowel.
No acute bowel wall thickening. Negative appendix

Vascular/Lymphatic: Advanced aortic atherosclerosis. No aneurysm. No
suspicious nodes

Reproductive: Massively enlarged prostate with mass effect on the
bladder

Other: Negative for pelvic effusion or free air

Musculoskeletal: Chronic compression deformity at L1. No acute or
suspicious osseous abnormality. Chronic sclerosis in the right iliac
bone.
IMPRESSION: 1. Negative for bowel obstruction or bowel wall thickening.
2. Patient is status post partial pancreatectomy and distal
gastrectomy. Interim finding of pneumobilia presumably related to
choledochal jejunostomy, some pneumobilia evident on prior CT from
3. Massively enlarged prostate with mass effect on the bladder.
Bladder is slightly thick walled

## 2023-03-21 IMAGING — CR DG CHEST 2V
2 series · 2 of 2 positions shown · non-contrast
Comparison: None.

CLINICAL DATA: Cough

EXAM:
CHEST - 2 VIEW

[w chest lat]
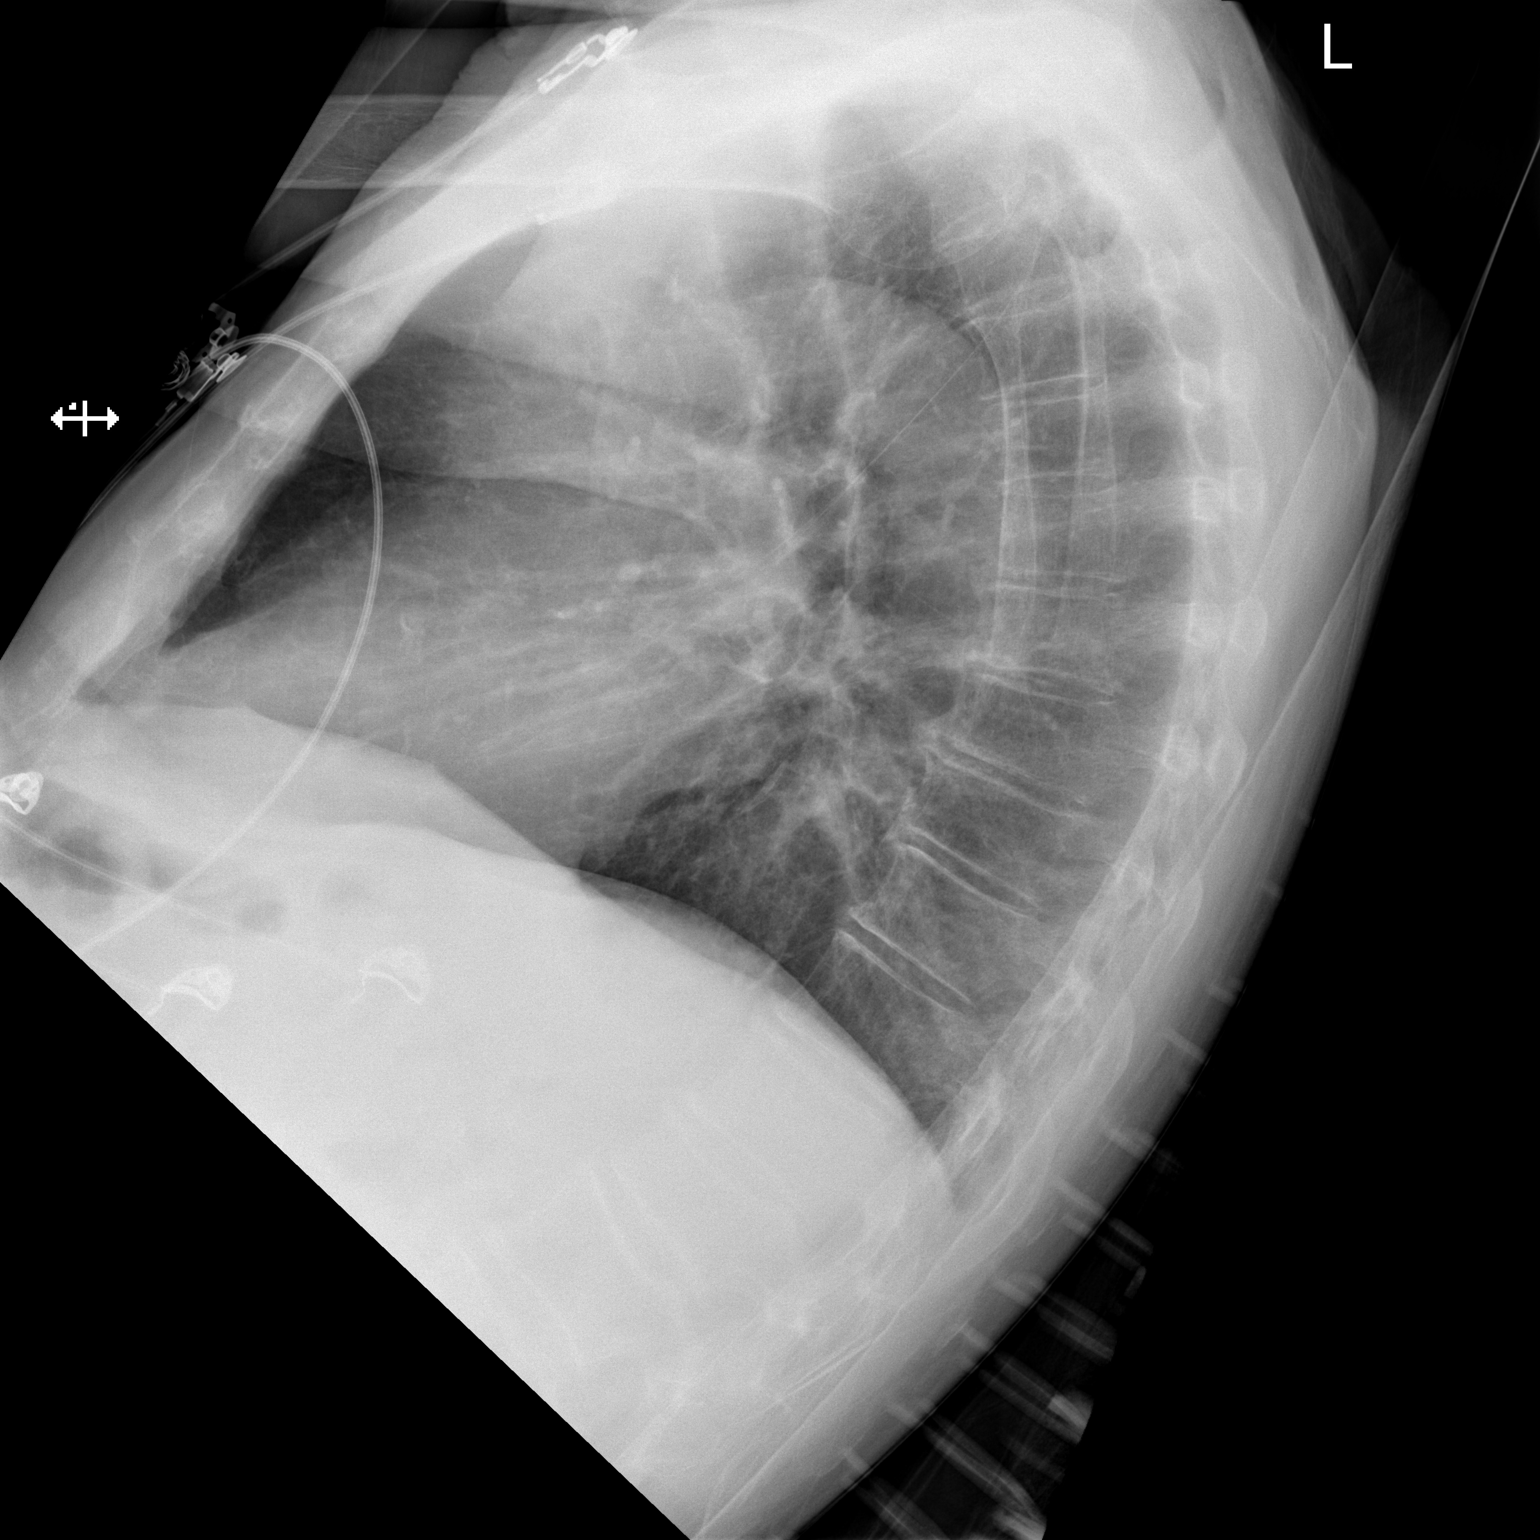

[x chest ap]
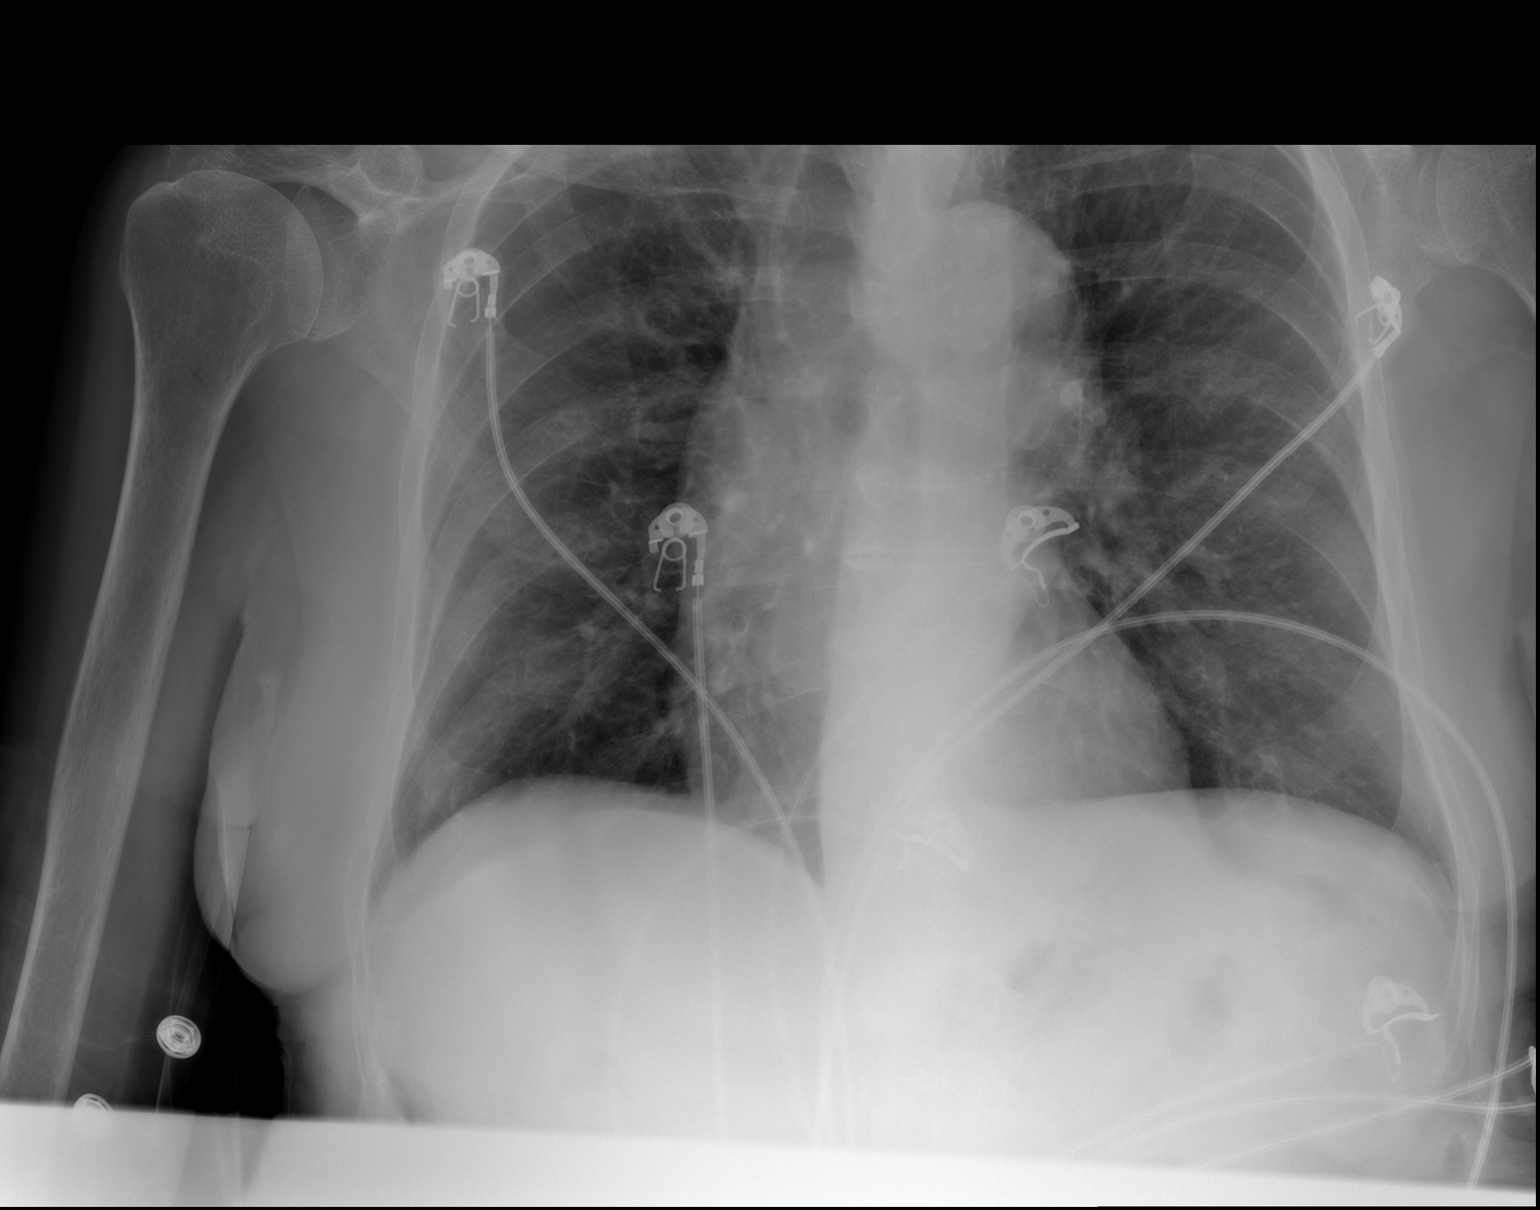

[2 of 2 positions shown; findings below may reference images not displayed]

FINDINGS: The heart size and mediastinal contours are within normal limits.
Both lungs are clear. The visualized skeletal structures are
unremarkable.
IMPRESSION: No active cardiopulmonary disease.

## 2023-04-15 ENCOUNTER — Ambulatory Visit (HOSPITAL_COMMUNITY)
Admission: RE | Admit: 2023-04-15 | Discharge: 2023-04-15 | Disposition: A | Discharge status: Home / Self Care | Source: Ambulatory Visit | Attending: Podiatry | Admitting: Podiatry

## 2023-04-15 DIAGNOSIS — R2243 Localized swelling, mass and lump, lower limb, bilateral: Secondary | ICD-10-CM | POA: Diagnosis not present

## 2023-04-18 ENCOUNTER — Encounter: Payer: Self-pay | Admitting: Podiatry

## 2023-04-18 ENCOUNTER — Other Ambulatory Visit: Payer: Self-pay | Admitting: Podiatry

## 2023-04-18 DIAGNOSIS — I872 Venous insufficiency (chronic) (peripheral): Secondary | ICD-10-CM

## 2023-04-23 ENCOUNTER — Other Ambulatory Visit (HOSPITAL_COMMUNITY): Payer: Self-pay

## 2023-04-30 DIAGNOSIS — S41111A Laceration without foreign body of right upper arm, initial encounter: Secondary | ICD-10-CM | POA: Diagnosis not present

## 2023-05-06 NOTE — Progress Notes (Unsigned)
 [facility] [place]  ASSESSMENT / PLAN: Francisco [name] is a 89 y.o. male with chronic venous insufficiency of bilateral lower extremities causing swelling (C2 disease).  Venous duplex is significant for deep and superficial venous reflux which does not appear amenable to endovenous ablation. Recommend compression and elevation for symptomatic relief. Follow up with me as needed.  CHIEF COMPLAINT: swelling in bilateral lower extremities, right worse than left  HISTORY OF PRESENT ILLNESS: Francisco [name] is a 89 y.o. male referred to clinic for evaluation of bilateral lower extremity swelling with venous reflux findings concerning for venous reflux.  Patient is a elderly man who appears fairly spry.  He has no complaints other than his swelling.  He states this is minimally bothersome to him.  He does not struggle to wear shoes or socks.  Swelling is not painful.  He is not interested in an intervention.  We reviewed his findings in detail.  Past Medical History:  Diagnosis Date   Arthritis    Asthma with allergic rhinitis    BPH (benign prostatic hyperplasia)    DM (diabetes mellitus) (HCC)    Elevated CEA    Elevated prostate specific antigen (PSA)    Fatty liver    GERD (gastroesophageal reflux disease)    History of kidney stones    Hx of adenomatous colonic polyps 02/14/2016   10 adenomas max 15 mm 02/2016 - no recall at his age   Hyperlipidemia    Impotence of organic origin    Neoplasm of uncertain behavior of prostate    Pancreatic cyst FOLLOWED BY DR Howard Macho   Personal history of urinary calculi    Pneumonia    Renal calculi 09/19/2011   Right ureteral stone     Past Surgical History:  Procedure Laterality Date   CHOLECYSTECTOMY     done with [facility]   COLONOSCOPY W/ POLYPECTOMY  2018   CYSTOSCOPY WITH RETROGRADE PYELOGRAM, URETEROSCOPY AND STENT PLACEMENT Right 08/19/2019   Procedure: CYSTOSCOPY WITH RETROGRADE PYELOGRAM, URETEROSCOPY, LASER  LITHOTRIPSY STENT PLACEMENT FIRST STAGE;  Surgeon: Osborn Blaze, MD;  Location: [facility];  Service: Urology;  Laterality: Right;  90 MINS   CYSTOSCOPY WITH RETROGRADE PYELOGRAM, URETEROSCOPY AND STENT PLACEMENT Right 09/04/2019   Procedure: CYSTOSCOPY WITH RETROGRADE PYELOGRAM, BASKETING OF STONES URETEROSCOPY AND STENT EXCHANGE;  Surgeon: Osborn Blaze, MD;  Location: [facility];  Service: Urology;  Laterality: Right;  60 MINS   CYSTOSCOPY WITH URETEROSCOPY  03/14/2011   Procedure: CYSTOSCOPY WITH URETEROSCOPY;  Surgeon: Soledad Dupes, MD;  Location: [facility];  Service: Urology;  Laterality: Right;  2 hours requested for this case  Right Ureter Stent Placement  C-ARM CAMERA DIGITAL URETEROSCOPE     EUS  04/12/2011   Procedure: UPPER ENDOSCOPIC ULTRASOUND (EUS) LINEAR;  Surgeon: Janel Medford, MD;  Location: [facility];  Service: Endoscopy;  Laterality: N/A;  radial linear/pt moved up a hour early by AW , Patty @ [facility] office ok'd & pt was called by AW   HOLMIUM LASER APPLICATION Right 08/19/2019   Procedure: HOLMIUM LASER APPLICATION;  Surgeon: Osborn Blaze, MD;  Location: [facility];  Service: Urology;  Laterality: Right;   KIDNEY STONE SURGERY  03/2011   PERCUTANEOUS NEPHROSTOLITHOTOMY  1985   WHIPPLE PROCEDURE  12/2011   [facility]   XI ROBOTIC ASSISTED SIMPLE PROSTATECTOMY N/A 06/15/2022   Procedure: XI ROBOTIC ASSISTED SIMPLE PROSTATECTOMY;  Surgeon: Melody Spurling., MD;  Location: [facility];  Service: Urology;  Laterality:  N/A;    Family History  Problem Relation Age of Onset   Diabetes Mother    Diabetes Sister    Cancer Brother        unknown type   Stroke Father     Social History   Socioeconomic History   Marital status: Married    Spouse name: Not on file   Number of children: 1   Years of education: Not on file   Highest education level: Not on file  Occupational History   Occupation: auctioneer/retierd  Tobacco Use   Smoking status:  Former    Current packs/day: 0.00    Types: Cigarettes    Start date: 04/11/1971    Quit date: 04/11/1991    Years since quitting: 32.0   Smokeless tobacco: Never  Vaping Use   Vaping status: Never Used  Substance and Sexual Activity   Alcohol  use: Yes    Comment: 1 can every 6 months or so/rare   Drug use: No   Sexual activity: Not Currently  Other Topics Concern   Not on file  Social History Narrative   Married 1 daughter   Prior truck sales   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   1 beer occasionally, no tobacco at this point no drug use   06/2017   Social Drivers of Health   Financial Resource Strain: Not on file  Food Insecurity: No Food Insecurity (06/17/2022)   Hunger Vital Sign    Worried About Running Out of Food in the Last Year: Never true    Ran Out of Food in the Last Year: Never true  Transportation Needs: No Transportation Needs (06/17/2022)   PRAPARE - Administrator, Civil Service (Medical): No    Lack of Transportation (Non-Medical): No  Physical Activity: Not on file  Stress: Not on file  Social Connections: Not on file  Intimate Partner Violence: Not At Risk (06/17/2022)   Humiliation, Afraid, Rape, and Kick questionnaire    Fear of Current or Ex-Partner: No    Emotionally Abused: No    Physically Abused: No    Sexually Abused: No    Allergies  Allergen Reactions   Metformin Hcl Other (See Comments)    GI upset    Current Outpatient Medications  Medication Sig Dispense Refill   ACCU-CHEK FASTCLIX LANCETS MISC by Does not apply route daily.     cephALEXin  (KEFLEX ) 500 MG capsule Take 1 capsule (500 mg total) by mouth 3 (three) times daily. 21 capsule 0   CREON  3000-9500 units CPEP Take 2 capsules by mouth 3 (three) times daily before meals.     docusate sodium  (COLACE) 100 MG capsule Take 1 capsule (100 mg total) by mouth 2 (two) times daily.     empagliflozin  (JARDIANCE ) 10 MG TABS tablet Take 5 mg by mouth daily.     empagliflozin   (JARDIANCE ) 10 MG TABS tablet Take 1 tablet (10 mg total) by mouth daily. 90 tablet 3   esomeprazole (NEXIUM) 40 MG capsule Take 40 mg by mouth 2 (two) times daily.     finasteride  (PROSCAR ) 5 MG tablet Take 5 mg by mouth daily.     finasteride  (PROSCAR ) 5 MG tablet Take 1 tablet (5 mg total) by mouth daily. 90 tablet 3   furosemide  (LASIX ) 20 MG tablet Take 40 mg by mouth daily.     furosemide  (LASIX ) 20 MG tablet Take 1 tablet (20 mg total) by mouth daily as needed for leg swelling 90 tablet 1  glucose blood (ACCU-CHEK GUIDE) test strip Use twice daily as directed 100 each 6   HYDROcodone -acetaminophen  (NORCO) 5-325 MG tablet Take 1-2 tablets by mouth every 6 (six) hours as needed for moderate pain or severe pain. 20 tablet 0   loperamide  (IMODIUM  A-D) 2 MG tablet Take 4 mg by mouth 2 (two) times daily.     nystatin-triamcinolone  (MYCOLOG II) cream Apply 1 Application topically daily as needed (itching).     oxybutynin  (DITROPAN ) 5 MG tablet Take 5 mg by mouth every 8 (eight) hours as needed for bladder spasms.     oxybutynin  (DITROPAN ) 5 MG tablet Take 1 tablet (5 mg total) by mouth every 8 (eight) hours as needed for bladder spasms 90 tablet 1   Pancrelipase , Lip-Prot-Amyl, (CREON ) 3000-9500 units CPEP Take 2 capsules (6,000 Units total) by mouth 3 (three) times daily before meals (breakfast, lunch, and supper). 540 capsule 2   Probiotic Product (ALIGN) 4 MG CAPS Take 4 mg by mouth daily.      sulfamethoxazole -trimethoprim  (BACTRIM  DS) 800-160 MG tablet Take 1 tablet by mouth 2 (two) times daily. Start the day prior to foley removal appointment 6 tablet 0   sulfamethoxazole -trimethoprim  (BACTRIM  DS) 800-160 MG tablet Take 1 tablet by mouth 2 (two) times daily with food 14 tablet 0   torsemide  (DEMADEX ) 20 MG tablet Take 1 tablet (20 mg total) by mouth daily. 90 tablet 3   triamcinolone  cream (KENALOG ) 0.1 % Apply 1 Application topically 3 (three) times daily as needed for swelling 454 g 0    No current facility-administered medications for this visit.    PHYSICAL EXAM Vitals:   05/07/23 0827  BP: 125/66  Pulse: (!) 55  Temp: 97.9 F (36.6 C)  SpO2: 99%  Weight: 131 lb (59.4 kg)  Height: 5\' 8"  (1.727 m)    Elderly man in no distress Regular rate and rhythm Unlabored breathing Palpable dorsalis pedis pulses bilaterally Pitting edema of the bilateral lower extremities, right worse than left.  The right has 2+ edema from the mid foot to the calf.  The left has 1+ edema about the ankle.  PERTINENT LABORATORY AND RADIOLOGIC DATA  Most recent CBC    Latest Ref Rng & Units 06/17/2022    5:17 AM 06/16/2022    5:44 AM 06/15/2022   11:56 AM  CBC  Hemoglobin 13.0 - 17.0 g/dL 29.5  28.4  13.2   Hematocrit 39.0 - 52.0 % 33.0  33.6  38.8      Most recent CMP    Latest Ref Rng & Units 06/17/2022    5:17 AM 06/16/2022    5:44 AM 06/07/2022    8:30 AM  CMP  Glucose 70 - 99 mg/dL 440  102  725   BUN 8 - 23 mg/dL 19  23  13    Creatinine 0.61 - 1.24 mg/dL 3.66  4.40  3.47   Sodium 135 - 145 mmol/L 133  131  132   Potassium 3.5 - 5.1 mmol/L 4.5  4.4  3.5   Chloride 98 - 111 mmol/L 105  105  103   CO2 22 - 32 mmol/L 22  21  23    Calcium  8.9 - 10.3 mg/dL 7.8  7.9  8.1   Total Protein 6.5 - 8.1 g/dL   5.9   Total Bilirubin 0.3 - 1.2 mg/dL   0.3   Alkaline Phos 38 - 126 U/L   154   AST 15 - 41 U/L   25   ALT  0 - 44 U/L   21     Renal function CrCl cannot be calculated (Patient's most recent lab result is older than the maximum 21 days allowed.).  Hgb A1c MFr Bld (%)  Date Value  06/07/2022 6.6 (H)   Right:  - No evidence of deep vein thrombosis seen in the right lower extremity,  from the common femoral through the popliteal veins.  - No evidence of superficial venous thrombosis in the right lower  extremity.  - Interstitial fluid in the calf.   -Deep vein reflux in the CFV.   - No evidence of superficial vein reflux.    Left:  - No evidence of deep vein  thrombosis seen in the left lower extremity,  from the common femoral through the popliteal veins.  - No evidence of superficial venous thrombosis in the left lower  extremity.   - Deep vein reflux in the CFV.   - Superficial vein reflux in the GSV prox thigh to prox calf.   Heber Little. Edgardo Goodwill, MD FACS [facility] Phone Number: [phone] 05/07/2023 9:11 AM   Total time spent on preparing this encounter including chart review, data review, collecting history, examining the patient, and coordinating care: 45 minutes.  Portions of this report may have been transcribed using voice recognition software.  Every effort has been made to ensure accuracy; however, inadvertent computerized transcription errors may still be present.

## 2023-05-07 ENCOUNTER — Ambulatory Visit: Attending: Vascular Surgery | Admitting: Vascular Surgery

## 2023-05-07 ENCOUNTER — Encounter: Payer: Self-pay | Admitting: Vascular Surgery

## 2023-05-07 VITALS — BP 125/66 | HR 55 | Temp 97.9°F | Ht 68.0 in | Wt 131.0 lb

## 2023-05-07 DIAGNOSIS — I872 Venous insufficiency (chronic) (peripheral): Secondary | ICD-10-CM | POA: Diagnosis not present

## 2023-05-13 DIAGNOSIS — L219 Seborrheic dermatitis, unspecified: Secondary | ICD-10-CM | POA: Diagnosis not present

## 2023-05-13 DIAGNOSIS — L570 Actinic keratosis: Secondary | ICD-10-CM | POA: Diagnosis not present

## 2023-05-13 DIAGNOSIS — L821 Other seborrheic keratosis: Secondary | ICD-10-CM | POA: Diagnosis not present

## 2023-05-13 DIAGNOSIS — C44729 Squamous cell carcinoma of skin of left lower limb, including hip: Secondary | ICD-10-CM | POA: Diagnosis not present

## 2023-05-13 DIAGNOSIS — L578 Other skin changes due to chronic exposure to nonionizing radiation: Secondary | ICD-10-CM | POA: Diagnosis not present

## 2023-05-14 ENCOUNTER — Other Ambulatory Visit (HOSPITAL_COMMUNITY): Payer: Self-pay

## 2023-05-20 DIAGNOSIS — Z87891 Personal history of nicotine dependence: Secondary | ICD-10-CM | POA: Diagnosis not present

## 2023-05-20 DIAGNOSIS — Z09 Encounter for follow-up examination after completed treatment for conditions other than malignant neoplasm: Secondary | ICD-10-CM | POA: Diagnosis not present

## 2023-05-20 DIAGNOSIS — T182XXA Foreign body in stomach, initial encounter: Secondary | ICD-10-CM | POA: Diagnosis not present

## 2023-05-20 DIAGNOSIS — Z79899 Other long term (current) drug therapy: Secondary | ICD-10-CM | POA: Diagnosis not present

## 2023-05-20 DIAGNOSIS — Z8719 Personal history of other diseases of the digestive system: Secondary | ICD-10-CM | POA: Diagnosis not present

## 2023-05-20 DIAGNOSIS — Z9889 Other specified postprocedural states: Secondary | ICD-10-CM | POA: Diagnosis not present

## 2023-05-20 DIAGNOSIS — Z9884 Bariatric surgery status: Secondary | ICD-10-CM | POA: Diagnosis not present

## 2023-05-20 DIAGNOSIS — Z980 Intestinal bypass and anastomosis status: Secondary | ICD-10-CM | POA: Diagnosis not present

## 2023-05-20 DIAGNOSIS — E119 Type 2 diabetes mellitus without complications: Secondary | ICD-10-CM | POA: Diagnosis not present

## 2023-05-20 DIAGNOSIS — K2270 Barrett's esophagus without dysplasia: Secondary | ICD-10-CM | POA: Diagnosis not present

## 2023-05-20 DIAGNOSIS — K222 Esophageal obstruction: Secondary | ICD-10-CM | POA: Diagnosis not present

## 2023-05-20 DIAGNOSIS — K208 Other esophagitis without bleeding: Secondary | ICD-10-CM | POA: Diagnosis not present

## 2023-05-20 DIAGNOSIS — K2900 Acute gastritis without bleeding: Secondary | ICD-10-CM | POA: Diagnosis not present

## 2023-05-20 DIAGNOSIS — K2950 Unspecified chronic gastritis without bleeding: Secondary | ICD-10-CM | POA: Diagnosis not present

## 2023-05-20 DIAGNOSIS — Z7984 Long term (current) use of oral hypoglycemic drugs: Secondary | ICD-10-CM | POA: Diagnosis not present

## 2023-05-22 DIAGNOSIS — K760 Fatty (change of) liver, not elsewhere classified: Secondary | ICD-10-CM | POA: Diagnosis not present

## 2023-05-22 DIAGNOSIS — E785 Hyperlipidemia, unspecified: Secondary | ICD-10-CM | POA: Diagnosis not present

## 2023-05-22 DIAGNOSIS — E1169 Type 2 diabetes mellitus with other specified complication: Secondary | ICD-10-CM | POA: Diagnosis not present

## 2023-06-18 ENCOUNTER — Encounter: Payer: Self-pay | Admitting: Pharmacist

## 2023-06-18 ENCOUNTER — Other Ambulatory Visit: Payer: Self-pay

## 2023-06-18 ENCOUNTER — Other Ambulatory Visit (HOSPITAL_COMMUNITY): Payer: Self-pay

## 2023-06-19 ENCOUNTER — Emergency Department (HOSPITAL_COMMUNITY)

## 2023-06-19 ENCOUNTER — Emergency Department (HOSPITAL_COMMUNITY)
Admission: EM | Admit: 2023-06-19 | Discharge: 2023-06-19 | Disposition: A | Discharge status: Home / Self Care | Attending: Emergency Medicine | Admitting: Emergency Medicine

## 2023-06-19 ENCOUNTER — Other Ambulatory Visit: Payer: Self-pay

## 2023-06-19 DIAGNOSIS — S8991XA Unspecified injury of right lower leg, initial encounter: Secondary | ICD-10-CM | POA: Diagnosis not present

## 2023-06-19 DIAGNOSIS — Z7951 Long term (current) use of inhaled steroids: Secondary | ICD-10-CM | POA: Insufficient documentation

## 2023-06-19 DIAGNOSIS — R220 Localized swelling, mass and lump, head: Secondary | ICD-10-CM | POA: Diagnosis not present

## 2023-06-19 DIAGNOSIS — J45909 Unspecified asthma, uncomplicated: Secondary | ICD-10-CM | POA: Insufficient documentation

## 2023-06-19 DIAGNOSIS — S42352A Displaced comminuted fracture of shaft of humerus, left arm, initial encounter for closed fracture: Secondary | ICD-10-CM | POA: Diagnosis not present

## 2023-06-19 DIAGNOSIS — M25512 Pain in left shoulder: Secondary | ICD-10-CM | POA: Insufficient documentation

## 2023-06-19 DIAGNOSIS — Y92512 Supermarket, store or market as the place of occurrence of the external cause: Secondary | ICD-10-CM | POA: Diagnosis not present

## 2023-06-19 DIAGNOSIS — Z7984 Long term (current) use of oral hypoglycemic drugs: Secondary | ICD-10-CM | POA: Diagnosis present

## 2023-06-19 DIAGNOSIS — S199XXA Unspecified injury of neck, initial encounter: Secondary | ICD-10-CM | POA: Diagnosis not present

## 2023-06-19 DIAGNOSIS — I10 Essential (primary) hypertension: Secondary | ICD-10-CM | POA: Diagnosis not present

## 2023-06-19 DIAGNOSIS — W19XXXA Unspecified fall, initial encounter: Secondary | ICD-10-CM | POA: Insufficient documentation

## 2023-06-19 DIAGNOSIS — R000 Tachycardia, unspecified: Secondary | ICD-10-CM | POA: Diagnosis not present

## 2023-06-19 DIAGNOSIS — M25461 Effusion, right knee: Secondary | ICD-10-CM | POA: Diagnosis not present

## 2023-06-19 DIAGNOSIS — E119 Type 2 diabetes mellitus without complications: Secondary | ICD-10-CM | POA: Diagnosis not present

## 2023-06-19 DIAGNOSIS — R55 Syncope and collapse: Secondary | ICD-10-CM | POA: Diagnosis not present

## 2023-06-19 DIAGNOSIS — S0083XA Contusion of other part of head, initial encounter: Secondary | ICD-10-CM | POA: Insufficient documentation

## 2023-06-19 DIAGNOSIS — M25561 Pain in right knee: Secondary | ICD-10-CM | POA: Diagnosis not present

## 2023-06-19 DIAGNOSIS — S0990XA Unspecified injury of head, initial encounter: Secondary | ICD-10-CM | POA: Diagnosis not present

## 2023-06-19 DIAGNOSIS — S80911A Unspecified superficial injury of right knee, initial encounter: Secondary | ICD-10-CM | POA: Diagnosis not present

## 2023-06-19 LAB — BASIC METABOLIC PANEL WITH GFR
Anion gap: 5 (ref 5–15)
BUN: 12 mg/dL (ref 8–23)
CO2: 23 mmol/L (ref 22–32)
Calcium: 8.5 mg/dL — ABNORMAL LOW (ref 8.9–10.3)
Chloride: 109 mmol/L (ref 98–111)
Creatinine, Ser: 1.03 mg/dL (ref 0.61–1.24)
GFR, Estimated: >60 mL/min (ref >60)
Glucose, Bld: 186 mg/dL — ABNORMAL HIGH (ref 70–99)
Potassium: 3.8 mmol/L (ref 3.5–5.1)
Sodium: 137 mmol/L (ref 135–145)

## 2023-06-19 LAB — CBC
HCT: 38.5 % — ABNORMAL LOW (ref 39.0–52.0)
Hemoglobin: 12.2 g/dL — ABNORMAL LOW (ref 13.0–17.0)
MCH: 29.9 pg (ref 26.0–34.0)
MCHC: 31.7 g/dL (ref 30.0–36.0)
MCV: 94.4 fL (ref 80.0–100.0)
Platelets: 204 10*3/uL (ref 150–400)
RBC: 4.08 MIL/uL — ABNORMAL LOW (ref 4.22–5.81)
RDW: 15.9 % — ABNORMAL HIGH (ref 11.5–15.5)
WBC: 10.4 10*3/uL (ref 4.0–10.5)
nRBC: 0 % (ref 0.0–0.2)

## 2023-06-19 MED ORDER — OXYCODONE-ACETAMINOPHEN 5-325 MG PO TABS: 1.0000 | ORAL_TABLET | Freq: Three times a day (TID) | ORAL | 0 refills | Status: DC | PRN

## 2023-06-19 MED ORDER — FENTANYL CITRATE PF 50 MCG/ML IJ SOSY
50.0000 ug | PREFILLED_SYRINGE | Freq: Once | INTRAMUSCULAR | Status: AC | PRN
  Administered 2023-06-19: 50 ug via INTRAVENOUS
  Filled 2023-06-19: qty 1

## 2023-06-19 NOTE — ED Provider Notes (Signed)
 [place] EMERGENCY DEPARTMENT AT [facility] Provider Note   CSN: [id number] Arrival date & time: 06/19/23  1232     Patient presents with: Francisco [name]   Francisco [name] is a 89 y.o. male.   Patient with history of diabetes presents today with complaints of fall. He states that same occurred immediately prior to arrival when he was in the grocery store and lost his balance and fell. Nursing note reports syncopal episode, however patient repeatedly denies this and states I absolutely did not pass out at any time. He does note that he hit his head, denies losing consciousness. No anticoagulation. Endorses pain to his left shoulder area. EMS gave 200 mcg of IN fentanyl  with improvement. Also endorses some pain to his right knee as well. Does have a bruise to his head and a small wound, states that his tetanus is up-to-date.  Denies any headache or neck pain.  C-collar was placed by EMS, however patient is adamant that he wants it removed and repeatedly confirms that he is not having neck pain. Denies vision changes, weakness, numbness/tingling, chest pain, or shortness of breath.  The history is provided by the patient. No language interpreter was used.  Fall       Prior to Admission medications   Medication Sig Start Date End Date Taking? Authorizing Provider  ACCU-CHEK FASTCLIX LANCETS MISC by Does not apply route daily.    [provider]  cephALEXin  (KEFLEX ) 500 MG capsule Take 1 capsule (500 mg total) by mouth 3 (three) times daily. 10/09/22   Charity Conch, DPM  CREON  3000-9500 units CPEP Take 2 capsules by mouth 3 (three) times daily before meals. 07/26/20   [provider]  docusate sodium  (COLACE) 100 MG capsule Take 1 capsule (100 mg total) by mouth 2 (two) times daily. 06/15/22   Carrolyn Clan, PA-C  empagliflozin  (JARDIANCE ) 10 MG TABS tablet Take 5 mg by mouth daily.    [provider]  empagliflozin  (JARDIANCE ) 10 MG TABS tablet Take 1  tablet (10 mg total) by mouth daily. 01/08/23     esomeprazole (NEXIUM) 40 MG capsule Take 40 mg by mouth 2 (two) times daily.    [provider]  finasteride  (PROSCAR ) 5 MG tablet Take 5 mg by mouth daily.    [provider]  finasteride  (PROSCAR ) 5 MG tablet Take 1 tablet (5 mg total) by mouth daily. 12/24/22     furosemide  (LASIX ) 20 MG tablet Take 40 mg by mouth daily.    [provider]  furosemide  (LASIX ) 20 MG tablet Take 1 tablet (20 mg total) by mouth daily as needed for leg swelling 01/17/22   Suan Elm, MD  glucose blood (ACCU-CHEK GUIDE) test strip Use twice daily as directed 04/18/22     HYDROcodone -acetaminophen  (NORCO) 5-325 MG tablet Take 1-2 tablets by mouth every 6 (six) hours as needed for moderate pain or severe pain. 06/15/22   Carrolyn Clan, PA-C  loperamide  (IMODIUM  A-D) 2 MG tablet Take 4 mg by mouth 2 (two) times daily.    [provider]  nystatin-triamcinolone  (MYCOLOG II) cream Apply 1 Application topically daily as needed (itching). 09/12/20   [provider]  oxybutynin  (DITROPAN ) 5 MG tablet Take 5 mg by mouth every 8 (eight) hours as needed for bladder spasms. 05/30/22   [provider]  oxybutynin  (DITROPAN ) 5 MG tablet Take 1 tablet (5 mg total) by mouth every 8 (eight) hours as needed for bladder spasms 05/30/22  Pancrelipase , Lip-Prot-Amyl, (CREON ) 3000-9500 units CPEP Take 2 capsules (6,000 Units total) by mouth 3 (three) times daily before meals (breakfast, lunch, and supper). 10/15/22     Probiotic Product (ALIGN) 4 MG CAPS Take 4 mg by mouth daily.     [provider]  sulfamethoxazole -trimethoprim  (BACTRIM  DS) 800-160 MG tablet Take 1 tablet by mouth 2 (two) times daily. Start the day prior to foley removal appointment 06/15/22   Carrolyn Clan, PA-C  sulfamethoxazole -trimethoprim  (BACTRIM  DS) 800-160 MG tablet Take 1 tablet by mouth 2 (two) times daily with food 07/17/22     torsemide  (DEMADEX ) 20  MG tablet Take 1 tablet (20 mg total) by mouth daily. 09/06/22     triamcinolone  cream (KENALOG ) 0.1 % Apply 1 Application topically 3 (three) times daily as needed for swelling 09/06/22       Allergies: Metformin hcl    Review of Systems  Musculoskeletal:  Positive for arthralgias and myalgias.  All other systems reviewed and are negative.   Updated Vital Signs BP (!) 153/75 (BP Location: Right Arm)   Pulse 65   Temp 97.6 F (36.4 C) (Oral)   Resp 16   Ht 5' 8 (1.727 m)   Wt 60 kg   SpO2 100%   BMI 20.11 kg/m   Physical Exam Vitals and nursing note reviewed.  Constitutional:      General: He is not in acute distress.    Appearance: Normal appearance. He is normal weight. He is not ill-appearing, toxic-appearing or diaphoretic.  HENT:     Head: Normocephalic and atraumatic.     Comments: No racoon eyes No battle sign  Hematoma noted to the left upper forehead with overlying wound. No active bleeding, crepitus, or deformity.    Mouth/Throat:     Comments: Upper lip with small wound.  No active bleeding.  No signs of intraoral trauma or missing teeth.  No trismus.  No tenderness to the mandible or maxilla.  Eyes:     Extraocular Movements: Extraocular movements intact.     Pupils: Pupils are equal, round, and reactive to light.    Cardiovascular:     Rate and Rhythm: Normal rate.     Comments: No tenderness to palpation of the anterior chest wall Pulmonary:     Effort: Pulmonary effort is normal. No respiratory distress.  Abdominal:     Comments: No abdominal tenderness or bruising   Musculoskeletal:        General: Normal range of motion.     Cervical back: Normal, normal range of motion and neck supple. No tenderness.     Thoracic back: Normal.     Lumbar back: Normal.     Comments: No midline tenderness, no stepoffs or deformity noted on palpation of cervical, thoracic, and lumbar spine  TTP noted to the left shoulder area.  With overlying bruising.  Good radial  pulse.  Obvious deformity to mid shaft humerus. Compartments soft.  Full ROM to the elbow and wrist without pain.  TTP noted to the right knee throughout without significant bruising or swelling or overlying wounds.  Good distal pulses and sensation.   Skin:    General: Skin is warm and dry.   Neurological:     General: No focal deficit present.     Mental Status: He is alert and oriented to person, place, and time.   Psychiatric:        Mood and Affect: Mood normal.        Behavior: Behavior normal.     (  all labs ordered are listed, but only abnormal results are displayed) Labs Reviewed  CBC  BASIC METABOLIC PANEL WITH GFR    EKG: None  Radiology: No results found.   Amos Balint Injury Treatment  Date/Time: 06/19/2023 5:45 PM  Performed by: Sherra Dk, PA-C Authorized by: Sherra Dk, PA-C   Consent:    Consent obtained:  Verbal   Consent given by:  Patient   Risks discussed:  Restricted joint movement and stiffness   Alternatives discussed:  No treatment, alternative treatment, immobilization and referralInjury location: upper arm Location details: left upper arm Injury type: fracture Fracture type: humeral shaft Pre-procedure neurovascular assessment: neurovascularly intact Pre-procedure distal perfusion: normal Pre-procedure neurological function: normal Pre-procedure range of motion: reduced  Anesthesia: Local anesthesia used: no  Patient sedated: NoManipulation performed: no Immobilization: splint Splint type: Coaptation splint. Splint Applied by: Elio [facility] Supplies used: plaster Post-procedure neurovascular assessment: post-procedure neurovascularly intact Post-procedure distal perfusion: normal Post-procedure neurological function: normal Post-procedure range of motion: improved      Medications Ordered in the ED  fentaNYL  (SUBLIMAZE ) injection 50 mcg (has no administration in time range)                                    Medical  Decision Making Amount and/or Complexity of Data Reviewed Labs: ordered. Radiology: ordered.  Risk Prescription drug management.   This patient is a 89 y.o. male who presents to the ED for concern of mechanical fall, this involves an extensive number of treatment options, and is a complaint that carries with it a high risk of complications and morbidity. The emergent differential diagnosis prior to evaluation includes, but is not limited to,  trauma . This is not an exhaustive differential.   Past Medical History / Co-morbidities / Social History:  has a past medical history of Arthritis, Asthma with allergic rhinitis, BPH (benign prostatic hyperplasia), DM (diabetes mellitus) (HCC), Elevated CEA, Elevated prostate specific antigen (PSA), Fatty liver, GERD (gastroesophageal reflux disease), History of kidney stones, adenomatous colonic polyps (02/14/2016), Hyperlipidemia, Impotence of organic origin, Neoplasm of uncertain behavior of prostate, Pancreatic cyst (FOLLOWED BY DR Howard Macho), Personal history of urinary calculi, Pneumonia, Renal calculi (09/19/2011), and Right ureteral stone.  Additional history: Chart reviewed.  Physical Exam: Physical exam performed. The pertinent findings include: left shoulder/mid shaft humerus with obvious deformity.  Good distal pulses and sensation.  Lab Tests: I ordered, and personally interpreted labs.  The pertinent results include:  hgb 12.2 improved from previous.  No other acute laboratory abnormalities.   Imaging Studies: I ordered imaging studies including Ct head, cervical spine, DG left shoulder, humerus, right knee. I independently visualized and interpreted imaging which showed   CT: No CT evidence of acute intracranial abnormality.   No acute fracture or traumatic malalignment of the cervical spine.   Small focus of soft tissue swelling in the left frontotemporal scalp.   Degenerative changes as above.   Chronic left sphenoid  sinusitis.  DG shoulder/humerus: Comminuted, displaced proximal to mid left humeral shaft fracture.   DG right knee: Diffuse soft tissue swelling with moderate joint effusion. No visible fracture.  I agree with the radiologist interpretation.   Cardiac Monitoring:  The patient was maintained on a cardiac monitor.  My attending physician Dr. Nora Beal viewed and interpreted the cardiac monitored which showed an underlying rhythm of: sinus rhythm, no STEMI. I agree with this interpretation.   Medications: I  ordered medication including fentanyl   for pain. Reevaluation of the patient after these medicines showed that the patient improved. I have reviewed the patients home medicines and have made adjustments as needed.  Disposition: After consideration of the diagnostic results and the patients response to treatment, I feel that emergency department workup does not suggest an emergent condition requiring admission or immediate intervention beyond what has been performed at this time. The plan is: Discharge with close orthopedic follow-up and return precautions.  Patient is able to stand and transfer to bedside toilet with 1 assist.  Given his injuries and age, I did offer admission for pain control and PT OT evaluation, however patient adamantly and refuses admission at this time stating Im not staying in this fucking hospital for one second more.  Patient placed in a splint per above, given Percocet for pain.  PDMP reviewed.  Patient advised not to drive or operate heavy machinery while taking this medication.  Also educated on increased fall risk with this medicine.  Given referral to orthopedics, emphasized importance of close follow-up. Evaluation and diagnostic testing in the emergency department does not suggest an emergent condition requiring admission or immediate intervention beyond what has been performed at this time.  Plan for discharge with close PCP follow-up.  Patient is understanding  and amenable with plan, educated on red flag symptoms that would prompt immediate return.  Patient discharged in stable condition.   This is a shared visit with supervising physician Dr. Nora Beal who has independently evaluated patient & provided guidance in evaluation/management/disposition, in agreement with care   Final diagnoses:  Fall, initial encounter  Closed displaced comminuted fracture of shaft of left humerus, initial encounter  Injury of right knee, initial encounter    ED Discharge Orders          Ordered    oxyCODONE -acetaminophen  (PERCOCET/ROXICET) 5-325 MG tablet  Every 8 hours PRN        06/19/23 1754          An After Visit Summary was printed and given to the patient.      Sherra Dk, PA-C 06/19/23 Maury Space, Victoria K, DO 06/20/23 [phone]

## 2023-06-19 NOTE — ED Triage Notes (Signed)
 Patient to ED by EMS from [facility] after syncopal episode. He states he was standing in line then woke up on floor. EMS placed him in a C-Collar and gave 200mcg of fentanyl  nasally. He c/o L arm pain/possible fracture, R knee pain and swelling and L shoulder pain with abrasion, he also has a skin tear to his R elbow.  He denies thinners.

## 2023-06-19 NOTE — ED Notes (Signed)
RN called ortho tech for sling

## 2023-06-19 NOTE — Progress Notes (Signed)
 Orthopedic Tech Progress Note Patient Details:  Francisco [name] 1933-02-16 [id number]  Ortho Devices Type of Ortho Device: Ace wrap, Cotton web roll, Shoulder immobilizer, Coapt Ortho Device/Splint Location: left co apt splint applied. left sling applied Ortho Device/Splint Interventions: Ordered, Application, Adjustment   Post Interventions Patient Tolerated: Well Instructions Provided: Adjustment of device, Care of device  [name] 06/19/2023, 4:51 PM

## 2023-06-19 NOTE — Discharge Instructions (Signed)
 As we discussed, x-ray imaging reveals that you have a fracture in your left upper arm.  We have placed you in a splint that you need to keep on at all times.  I have given you a referral to orthopedics that you need to call as soon as possible to schedule a close follow-up appointment.  This injury will need surgery and therefore it is imperative that you follow-up with them.  In the interim, I have given you a prescription for Percocet which is a narcotic pain medicine you can take as prescribed as needed for severe breakthrough pain only.  Do not drive or operate heavy machinery while taking this medication as it can be sedating and please be advised that this medicine will increase your fall risk and exercise extreme caution when taking it.  We did offer you admission given your injuries, however you have refused this.  If your pain is not well-controlled at home, it would be reasonable to have you admitted to the hospital.  Return if development of any new or worsening symptoms.

## 2023-06-19 NOTE — ED Notes (Signed)
 Patient called staff in to assist with going to bathroom due to patient coming in for fall and having swelling and pain to RLE staff informed patient its best to use a urinal or bedpan staff also explained it to family at bedside patient refused, staff reinforced not to ambulate

## 2023-06-19 NOTE — Progress Notes (Signed)
 Orthopedic Tech Progress Note Patient Details:  Francisco [name] 24-Oct-1933 [id number]  Patient ID: [name], male   DOB: 12-31-1933, 89 y.o.   MRN: [id number]  [name] 06/19/2023, 4:51 PM 2 rings removed, placed in specimen cup and given to patients spouse in room

## 2023-06-19 NOTE — ED Notes (Signed)
 Patient transported to CT

## 2023-06-19 NOTE — ED Notes (Signed)
 Patient refused to ambulate he verbally told nurse, Damn I just broke my damn arm and leg how the hell you expect me to walk. He was able to get from bed to [facility] with 2 person assist.

## 2023-06-20 ENCOUNTER — Telehealth: Payer: Self-pay

## 2023-06-20 ENCOUNTER — Telehealth: Payer: Self-pay | Admitting: Orthopedic Surgery

## 2023-06-20 NOTE — Telephone Encounter (Signed)
 Patient's wife called wanting to get an appointment for patient he has a Closed displaced comminuted fracture of shaft of left humerus. Went to hospital 06/19/23 referred to Dr. Rozelle Corning, she would like to get him in asap. I was going to add him for tomorrow but saw no add ons.   Please advise

## 2023-06-20 NOTE — Telephone Encounter (Signed)
 Tried calling about an appt. No answer LMVM to return call about getting patient seen.

## 2023-06-20 NOTE — Telephone Encounter (Signed)
 Duplicate. See other note. Tried calling, no answer. LMVM to return call.

## 2023-06-20 NOTE — Telephone Encounter (Signed)
 Pt's wife is begging that pt be seen today. She states her husband can barley stand. Explained to pt' s wife Dr Rozelle Corning is in surgery and we will try to call sometime after lunch with an answer. Please call pt at [phone].

## 2023-06-21 ENCOUNTER — Other Ambulatory Visit: Payer: Self-pay | Admitting: Specialist

## 2023-06-21 ENCOUNTER — Ambulatory Visit: Admitting: Orthopedic Surgery

## 2023-06-21 DIAGNOSIS — M25561 Pain in right knee: Secondary | ICD-10-CM

## 2023-06-21 DIAGNOSIS — S42292A Other displaced fracture of upper end of left humerus, initial encounter for closed fracture: Secondary | ICD-10-CM | POA: Diagnosis not present

## 2023-06-25 DIAGNOSIS — S42202D Unspecified fracture of upper end of left humerus, subsequent encounter for fracture with routine healing: Secondary | ICD-10-CM | POA: Diagnosis not present

## 2023-06-25 DIAGNOSIS — M25561 Pain in right knee: Secondary | ICD-10-CM | POA: Diagnosis not present

## 2023-07-02 ENCOUNTER — Ambulatory Visit (HOSPITAL_COMMUNITY)
Admission: RE | Admit: 2023-07-02 | Discharge: 2023-07-02 | Disposition: A | Discharge status: Home / Self Care | Source: Ambulatory Visit | Attending: Specialist | Admitting: Specialist

## 2023-07-02 DIAGNOSIS — M25561 Pain in right knee: Secondary | ICD-10-CM | POA: Insufficient documentation

## 2023-07-02 DIAGNOSIS — M1711 Unilateral primary osteoarthritis, right knee: Secondary | ICD-10-CM | POA: Diagnosis not present

## 2023-07-04 DIAGNOSIS — M25561 Pain in right knee: Secondary | ICD-10-CM | POA: Diagnosis not present

## 2023-07-04 DIAGNOSIS — S42292A Other displaced fracture of upper end of left humerus, initial encounter for closed fracture: Secondary | ICD-10-CM | POA: Diagnosis not present

## 2023-07-08 ENCOUNTER — Other Ambulatory Visit: Payer: Self-pay

## 2023-07-08 ENCOUNTER — Other Ambulatory Visit (HOSPITAL_COMMUNITY): Payer: Self-pay

## 2023-07-10 ENCOUNTER — Other Ambulatory Visit (HOSPITAL_COMMUNITY): Payer: Self-pay

## 2023-07-12 DIAGNOSIS — M25561 Pain in right knee: Secondary | ICD-10-CM | POA: Diagnosis not present

## 2023-07-12 DIAGNOSIS — S42295D Other nondisplaced fracture of upper end of left humerus, subsequent encounter for fracture with routine healing: Secondary | ICD-10-CM | POA: Diagnosis not present

## 2023-07-15 ENCOUNTER — Other Ambulatory Visit: Payer: Self-pay

## 2023-07-15 ENCOUNTER — Other Ambulatory Visit (HOSPITAL_COMMUNITY): Payer: Self-pay

## 2023-07-17 DIAGNOSIS — M25522 Pain in left elbow: Secondary | ICD-10-CM | POA: Diagnosis not present

## 2023-07-17 DIAGNOSIS — S82091D Other fracture of right patella, subsequent encounter for closed fracture with routine healing: Secondary | ICD-10-CM | POA: Diagnosis not present

## 2023-07-17 DIAGNOSIS — S42295D Other nondisplaced fracture of upper end of left humerus, subsequent encounter for fracture with routine healing: Secondary | ICD-10-CM | POA: Diagnosis not present

## 2023-07-18 ENCOUNTER — Ambulatory Visit: Admitting: Podiatry

## 2023-07-19 DIAGNOSIS — R82998 Other abnormal findings in urine: Secondary | ICD-10-CM | POA: Diagnosis not present

## 2023-07-19 DIAGNOSIS — R300 Dysuria: Secondary | ICD-10-CM | POA: Diagnosis not present

## 2023-07-20 ENCOUNTER — Other Ambulatory Visit (HOSPITAL_COMMUNITY): Payer: Self-pay

## 2023-07-22 NOTE — Progress Notes (Signed)
 [facility] - [place] [facility]   Patient Name: Francisco [name]   Patient DOB: [birth year]                Patient Age: 89 y.o.     Reason for Visit: Hattie wife dropped his right Premium 6 RIC off at the office with the concern that the receiver was broken.   Their managing audiologist is Powell Buoy, Au.D., CCC-A.   Procedure: Replaced 35M receiver and medium power dome.Problem resolved and aid given to Mrs.Fahl.  Follow-up: PRN   Billing: $15.00(cleaning and check)+ $85.00( Signia receiver)= $100.00

## 2023-07-23 DIAGNOSIS — Z9181 History of falling: Secondary | ICD-10-CM | POA: Diagnosis not present

## 2023-07-23 DIAGNOSIS — E119 Type 2 diabetes mellitus without complications: Secondary | ICD-10-CM | POA: Diagnosis not present

## 2023-07-23 DIAGNOSIS — Z604 Social exclusion and rejection: Secondary | ICD-10-CM | POA: Diagnosis not present

## 2023-07-23 DIAGNOSIS — Z7982 Long term (current) use of aspirin: Secondary | ICD-10-CM | POA: Diagnosis not present

## 2023-07-23 DIAGNOSIS — S42202D Unspecified fracture of upper end of left humerus, subsequent encounter for fracture with routine healing: Secondary | ICD-10-CM | POA: Diagnosis not present

## 2023-07-23 DIAGNOSIS — R32 Unspecified urinary incontinence: Secondary | ICD-10-CM | POA: Diagnosis not present

## 2023-07-23 DIAGNOSIS — K5900 Constipation, unspecified: Secondary | ICD-10-CM | POA: Diagnosis not present

## 2023-07-23 DIAGNOSIS — S82091D Other fracture of right patella, subsequent encounter for closed fracture with routine healing: Secondary | ICD-10-CM | POA: Diagnosis not present

## 2023-07-23 DIAGNOSIS — Z7984 Long term (current) use of oral hypoglycemic drugs: Secondary | ICD-10-CM | POA: Diagnosis not present

## 2023-07-23 DIAGNOSIS — Z556 Problems related to health literacy: Secondary | ICD-10-CM | POA: Diagnosis not present

## 2023-07-23 DIAGNOSIS — Z5982 Transportation insecurity: Secondary | ICD-10-CM | POA: Diagnosis not present

## 2023-07-24 ENCOUNTER — Other Ambulatory Visit (HOSPITAL_COMMUNITY): Payer: Self-pay

## 2023-07-24 MED ORDER — ACCU-CHEK GUIDE TEST VI STRP
ORAL_STRIP | 6 refills | Status: DC
  Filled 2023-07-24: qty 100, 50d supply, fill #0

## 2023-07-30 DIAGNOSIS — S42295D Other nondisplaced fracture of upper end of left humerus, subsequent encounter for fracture with routine healing: Secondary | ICD-10-CM | POA: Diagnosis not present

## 2023-07-30 DIAGNOSIS — S82091D Other fracture of right patella, subsequent encounter for closed fracture with routine healing: Secondary | ICD-10-CM | POA: Diagnosis not present

## 2023-08-05 ENCOUNTER — Encounter (HOSPITAL_COMMUNITY): Payer: Self-pay | Admitting: Internal Medicine

## 2023-08-05 ENCOUNTER — Emergency Department (HOSPITAL_COMMUNITY)

## 2023-08-05 ENCOUNTER — Inpatient Hospital Stay (HOSPITAL_COMMUNITY)
Admission: EM | Admit: 2023-08-05 | Discharge: 2023-08-10 | DRG: 193 | Disposition: A | Discharge status: Skilled Nursing Facility | Attending: Internal Medicine | Admitting: Internal Medicine

## 2023-08-05 ENCOUNTER — Other Ambulatory Visit: Payer: Self-pay

## 2023-08-05 DIAGNOSIS — E119 Type 2 diabetes mellitus without complications: Secondary | ICD-10-CM | POA: Diagnosis not present

## 2023-08-05 DIAGNOSIS — M11261 Other chondrocalcinosis, right knee: Secondary | ICD-10-CM | POA: Diagnosis not present

## 2023-08-05 DIAGNOSIS — S82001A Unspecified fracture of right patella, initial encounter for closed fracture: Secondary | ICD-10-CM | POA: Diagnosis not present

## 2023-08-05 DIAGNOSIS — L89312 Pressure ulcer of right buttock, stage 2: Secondary | ICD-10-CM | POA: Diagnosis present

## 2023-08-05 DIAGNOSIS — Z87891 Personal history of nicotine dependence: Secondary | ICD-10-CM

## 2023-08-05 DIAGNOSIS — Z8546 Personal history of malignant neoplasm of prostate: Secondary | ICD-10-CM

## 2023-08-05 DIAGNOSIS — K5900 Constipation, unspecified: Secondary | ICD-10-CM | POA: Diagnosis present

## 2023-08-05 DIAGNOSIS — Z888 Allergy status to other drugs, medicaments and biological substances status: Secondary | ICD-10-CM

## 2023-08-05 DIAGNOSIS — D509 Iron deficiency anemia, unspecified: Secondary | ICD-10-CM | POA: Diagnosis present

## 2023-08-05 DIAGNOSIS — Z7982 Long term (current) use of aspirin: Secondary | ICD-10-CM

## 2023-08-05 DIAGNOSIS — E43 Unspecified severe protein-calorie malnutrition: Secondary | ICD-10-CM | POA: Diagnosis present

## 2023-08-05 DIAGNOSIS — R5381 Other malaise: Secondary | ICD-10-CM | POA: Diagnosis not present

## 2023-08-05 DIAGNOSIS — Z7984 Long term (current) use of oral hypoglycemic drugs: Secondary | ICD-10-CM

## 2023-08-05 DIAGNOSIS — Z79899 Other long term (current) drug therapy: Secondary | ICD-10-CM

## 2023-08-05 DIAGNOSIS — S82001D Unspecified fracture of right patella, subsequent encounter for closed fracture with routine healing: Secondary | ICD-10-CM

## 2023-08-05 DIAGNOSIS — K219 Gastro-esophageal reflux disease without esophagitis: Secondary | ICD-10-CM | POA: Diagnosis not present

## 2023-08-05 DIAGNOSIS — Z823 Family history of stroke: Secondary | ICD-10-CM

## 2023-08-05 DIAGNOSIS — E876 Hypokalemia: Secondary | ICD-10-CM | POA: Diagnosis present

## 2023-08-05 DIAGNOSIS — R0989 Other specified symptoms and signs involving the circulatory and respiratory systems: Secondary | ICD-10-CM | POA: Diagnosis not present

## 2023-08-05 DIAGNOSIS — R519 Headache, unspecified: Secondary | ICD-10-CM | POA: Diagnosis not present

## 2023-08-05 DIAGNOSIS — R918 Other nonspecific abnormal finding of lung field: Secondary | ICD-10-CM | POA: Diagnosis not present

## 2023-08-05 DIAGNOSIS — E8809 Other disorders of plasma-protein metabolism, not elsewhere classified: Secondary | ICD-10-CM | POA: Diagnosis present

## 2023-08-05 DIAGNOSIS — I672 Cerebral atherosclerosis: Secondary | ICD-10-CM | POA: Diagnosis not present

## 2023-08-05 DIAGNOSIS — F0390 Unspecified dementia without behavioral disturbance: Secondary | ICD-10-CM | POA: Diagnosis present

## 2023-08-05 DIAGNOSIS — M1711 Unilateral primary osteoarthritis, right knee: Secondary | ICD-10-CM | POA: Diagnosis not present

## 2023-08-05 DIAGNOSIS — K760 Fatty (change of) liver, not elsewhere classified: Secondary | ICD-10-CM | POA: Diagnosis present

## 2023-08-05 DIAGNOSIS — Z66 Do not resuscitate: Secondary | ICD-10-CM | POA: Diagnosis present

## 2023-08-05 DIAGNOSIS — D649 Anemia, unspecified: Secondary | ICD-10-CM | POA: Diagnosis not present

## 2023-08-05 DIAGNOSIS — J45909 Unspecified asthma, uncomplicated: Secondary | ICD-10-CM | POA: Diagnosis present

## 2023-08-05 DIAGNOSIS — W19XXXD Unspecified fall, subsequent encounter: Secondary | ICD-10-CM | POA: Diagnosis present

## 2023-08-05 DIAGNOSIS — Z681 Body mass index (BMI) 19 or less, adult: Secondary | ICD-10-CM

## 2023-08-05 DIAGNOSIS — J189 Pneumonia, unspecified organism: Secondary | ICD-10-CM | POA: Diagnosis not present

## 2023-08-05 DIAGNOSIS — R531 Weakness: Secondary | ICD-10-CM

## 2023-08-05 DIAGNOSIS — S42352D Displaced comminuted fracture of shaft of humerus, left arm, subsequent encounter for fracture with routine healing: Secondary | ICD-10-CM | POA: Diagnosis not present

## 2023-08-05 DIAGNOSIS — I6782 Cerebral ischemia: Secondary | ICD-10-CM | POA: Diagnosis not present

## 2023-08-05 DIAGNOSIS — Z809 Family history of malignant neoplasm, unspecified: Secondary | ICD-10-CM

## 2023-08-05 DIAGNOSIS — S42302D Unspecified fracture of shaft of humerus, left arm, subsequent encounter for fracture with routine healing: Secondary | ICD-10-CM

## 2023-08-05 DIAGNOSIS — N400 Enlarged prostate without lower urinary tract symptoms: Secondary | ICD-10-CM | POA: Diagnosis not present

## 2023-08-05 DIAGNOSIS — S41112A Laceration without foreign body of left upper arm, initial encounter: Secondary | ICD-10-CM | POA: Diagnosis present

## 2023-08-05 DIAGNOSIS — Z1152 Encounter for screening for COVID-19: Secondary | ICD-10-CM

## 2023-08-05 DIAGNOSIS — R410 Disorientation, unspecified: Secondary | ICD-10-CM | POA: Diagnosis not present

## 2023-08-05 DIAGNOSIS — S42202D Unspecified fracture of upper end of left humerus, subsequent encounter for fracture with routine healing: Secondary | ICD-10-CM | POA: Diagnosis not present

## 2023-08-05 DIAGNOSIS — E785 Hyperlipidemia, unspecified: Secondary | ICD-10-CM | POA: Diagnosis present

## 2023-08-05 DIAGNOSIS — Z833 Family history of diabetes mellitus: Secondary | ICD-10-CM

## 2023-08-05 DIAGNOSIS — I10 Essential (primary) hypertension: Secondary | ICD-10-CM | POA: Diagnosis present

## 2023-08-05 DIAGNOSIS — J168 Pneumonia due to other specified infectious organisms: Secondary | ICD-10-CM | POA: Diagnosis not present

## 2023-08-05 DIAGNOSIS — Z860100 Personal history of colon polyps, unspecified: Secondary | ICD-10-CM

## 2023-08-05 LAB — CBC WITH DIFFERENTIAL/PLATELET
Abs Immature Granulocytes: 0.08 K/uL — ABNORMAL HIGH (ref 0.00–0.07)
Basophils Absolute: 0 K/uL (ref 0.0–0.1)
Basophils Relative: 0 %
Eosinophils Absolute: 0 K/uL (ref 0.0–0.5)
Eosinophils Relative: 0 %
HCT: 30.8 % — ABNORMAL LOW (ref 39.0–52.0)
Hemoglobin: 9.6 g/dL — ABNORMAL LOW (ref 13.0–17.0)
Immature Granulocytes: 1 %
Lymphocytes Relative: 6 %
Lymphs Abs: 0.8 K/uL (ref 0.7–4.0)
MCH: 26.7 pg (ref 26.0–34.0)
MCHC: 31.2 g/dL (ref 30.0–36.0)
MCV: 85.6 fL (ref 80.0–100.0)
Monocytes Absolute: 0.7 K/uL (ref 0.1–1.0)
Monocytes Relative: 5 %
Neutro Abs: 10.9 K/uL — ABNORMAL HIGH (ref 1.7–7.7)
Neutrophils Relative %: 88 %
Platelets: 244 K/uL (ref 150–400)
RBC: 3.6 MIL/uL — ABNORMAL LOW (ref 4.22–5.81)
RDW: 15.9 % — ABNORMAL HIGH (ref 11.5–15.5)
WBC: 12.4 K/uL — ABNORMAL HIGH (ref 4.0–10.5)
nRBC: 0 % (ref 0.0–0.2)

## 2023-08-05 LAB — MAGNESIUM: Magnesium: 1.8 mg/dL (ref 1.7–2.4)

## 2023-08-05 LAB — PROCALCITONIN: Procalcitonin: 2.46 ng/mL

## 2023-08-05 LAB — COMPREHENSIVE METABOLIC PANEL WITH GFR
ALT: 15 U/L (ref 0–44)
AST: 22 U/L (ref 15–41)
Albumin: 1.7 g/dL — ABNORMAL LOW (ref 3.5–5.0)
Alkaline Phosphatase: 165 U/L — ABNORMAL HIGH (ref 38–126)
Anion gap: 9 (ref 5–15)
BUN: 15 mg/dL (ref 8–23)
CO2: 25 mmol/L (ref 22–32)
Calcium: 8.4 mg/dL — ABNORMAL LOW (ref 8.9–10.3)
Chloride: 99 mmol/L (ref 98–111)
Creatinine, Ser: 0.68 mg/dL (ref 0.61–1.24)
GFR, Estimated: >60 mL/min (ref >60)
Glucose, Bld: 101 mg/dL — ABNORMAL HIGH (ref 70–99)
Potassium: 3.2 mmol/L — ABNORMAL LOW (ref 3.5–5.1)
Sodium: 133 mmol/L — ABNORMAL LOW (ref 135–145)
Total Bilirubin: 0.7 mg/dL (ref 0.0–1.2)
Total Protein: 5.4 g/dL — ABNORMAL LOW (ref 6.5–8.1)

## 2023-08-05 LAB — FERRITIN: Ferritin: 84 ng/mL (ref 24–336)

## 2023-08-05 LAB — RETICULOCYTES
Immature Retic Fract: 19.1 % — ABNORMAL HIGH (ref 2.3–15.9)
RBC.: 3.66 MIL/uL — ABNORMAL LOW (ref 4.22–5.81)
Retic Count, Absolute: 67 K/uL (ref 19.0–186.0)
Retic Ct Pct: 1.8 % (ref 0.4–3.1)

## 2023-08-05 LAB — LACTIC ACID, PLASMA
Lactic Acid, Venous: 0.8 mmol/L (ref 0.5–1.9)
Lactic Acid, Venous: 1.2 mmol/L (ref 0.5–1.9)

## 2023-08-05 LAB — BRAIN NATRIURETIC PEPTIDE: B Natriuretic Peptide: 154.6 pg/mL — ABNORMAL HIGH (ref 0.0–100.0)

## 2023-08-05 LAB — URINALYSIS, ROUTINE W REFLEX MICROSCOPIC
Bacteria, UA: NONE SEEN
Bilirubin Urine: NEGATIVE
Glucose, UA: >=500 mg/dL — AB
Hgb urine dipstick: NEGATIVE
Ketones, ur: NEGATIVE mg/dL
Leukocytes,Ua: NEGATIVE
Nitrite: NEGATIVE
Protein, ur: NEGATIVE mg/dL
Specific Gravity, Urine: 1.02 (ref 1.005–1.030)
pH: 6 (ref 5.0–8.0)

## 2023-08-05 LAB — CREATININE, URINE, RANDOM: Creatinine, Urine: 90 mg/dL

## 2023-08-05 LAB — IRON AND TIBC
Iron: 21 ug/dL — ABNORMAL LOW (ref 45–182)
Saturation Ratios: 10 % — ABNORMAL LOW (ref 17.9–39.5)
TIBC: 204 ug/dL — ABNORMAL LOW (ref 250–450)
UIBC: 183 ug/dL

## 2023-08-05 LAB — PHOSPHORUS: Phosphorus: 2.6 mg/dL (ref 2.5–4.6)

## 2023-08-05 LAB — TSH: TSH: 3.557 u[IU]/mL (ref 0.350–4.500)

## 2023-08-05 LAB — GLUCOSE, CAPILLARY: Glucose-Capillary: 92 mg/dL (ref 70–99)

## 2023-08-05 LAB — SODIUM, URINE, RANDOM: Sodium, Ur: 59 mmol/L

## 2023-08-05 LAB — PROTIME-INR
INR: 1.1 (ref 0.8–1.2)
Prothrombin Time: 15.2 s (ref 11.4–15.2)

## 2023-08-05 LAB — AMMONIA: Ammonia: <13 umol/L (ref 9–35)

## 2023-08-05 LAB — SARS CORONAVIRUS 2 BY RT PCR: SARS Coronavirus 2 by RT PCR: NEGATIVE

## 2023-08-05 LAB — APTT: aPTT: 27 s (ref 24–36)

## 2023-08-05 LAB — CK: Total CK: 20 U/L — ABNORMAL LOW (ref 49–397)

## 2023-08-05 LAB — CBG MONITORING, ED: Glucose-Capillary: 98 mg/dL (ref 70–99)

## 2023-08-05 MED ORDER — ONDANSETRON HCL 4 MG PO TABS: 4.0000 mg | ORAL_TABLET | Freq: Four times a day (QID) | ORAL | Status: DC | PRN

## 2023-08-05 MED ORDER — HYDROCODONE-ACETAMINOPHEN 5-325 MG PO TABS
1.0000 | ORAL_TABLET | ORAL | Status: DC | PRN
  Filled 2023-08-05: qty 1

## 2023-08-05 MED ORDER — ALBUTEROL SULFATE (2.5 MG/3ML) 0.083% IN NEBU
2.5000 mg | INHALATION_SOLUTION | RESPIRATORY_TRACT | Status: DC | PRN
Start: 2023-08-05 — End: 2023-08-10

## 2023-08-05 MED ORDER — FINASTERIDE 5 MG PO TABS
5.0000 mg | ORAL_TABLET | Freq: Every day | ORAL | Status: DC
  Administered 2023-08-06 – 2023-08-10 (×5): 5 mg via ORAL
  Filled 2023-08-05 (×5): qty 1

## 2023-08-05 MED ORDER — SODIUM CHLORIDE 0.9 % IV SOLN
500.0000 mg | INTRAVENOUS | Status: DC
  Administered 2023-08-06 – 2023-08-07 (×2): 500 mg via INTRAVENOUS
  Filled 2023-08-05 (×2): qty 5

## 2023-08-05 MED ORDER — POTASSIUM CHLORIDE CRYS ER 20 MEQ PO TBCR
20.0000 meq | EXTENDED_RELEASE_TABLET | Freq: Once | ORAL | Status: AC
  Administered 2023-08-05: 20 meq via ORAL
  Filled 2023-08-05: qty 1

## 2023-08-05 MED ORDER — SODIUM CHLORIDE 0.9 % IV SOLN
500.0000 mg | Freq: Once | INTRAVENOUS | Status: AC
  Administered 2023-08-05: 500 mg via INTRAVENOUS
  Filled 2023-08-05: qty 5

## 2023-08-05 MED ORDER — PANTOPRAZOLE SODIUM 40 MG PO TBEC
40.0000 mg | DELAYED_RELEASE_TABLET | Freq: Every day | ORAL | Status: DC
  Administered 2023-08-06 – 2023-08-10 (×5): 40 mg via ORAL
  Filled 2023-08-05 (×5): qty 1

## 2023-08-05 MED ORDER — ONDANSETRON HCL 4 MG/2ML IJ SOLN: 4.0000 mg | Freq: Four times a day (QID) | INTRAMUSCULAR | Status: DC | PRN

## 2023-08-05 MED ORDER — SODIUM CHLORIDE 0.9 % IV SOLN: INTRAVENOUS | Status: DC

## 2023-08-05 MED ORDER — FENTANYL CITRATE PF 50 MCG/ML IJ SOSY: 12.5000 ug | PREFILLED_SYRINGE | INTRAMUSCULAR | Status: DC | PRN

## 2023-08-05 MED ORDER — ALBUMIN HUMAN 5 % IV SOLN
12.5000 g | Freq: Once | INTRAVENOUS | Status: AC
  Administered 2023-08-05: 12.5 g via INTRAVENOUS
  Filled 2023-08-05: qty 250

## 2023-08-05 MED ORDER — ACETAMINOPHEN 325 MG PO TABS: 650.0000 mg | ORAL_TABLET | Freq: Four times a day (QID) | ORAL | Status: DC | PRN

## 2023-08-05 MED ORDER — THIAMINE HCL 100 MG/ML IJ SOLN
100.0000 mg | Freq: Every day | INTRAMUSCULAR | Status: DC
  Administered 2023-08-05 – 2023-08-08 (×4): 100 mg via INTRAVENOUS
  Filled 2023-08-05 (×4): qty 2

## 2023-08-05 MED ORDER — PANCRELIPASE (LIP-PROT-AMYL) 12000-38000 UNITS PO CPEP
12000.0000 [IU] | ORAL_CAPSULE | Freq: Three times a day (TID) | ORAL | Status: DC
  Administered 2023-08-06 – 2023-08-10 (×13): 12000 [IU] via ORAL
  Filled 2023-08-05 (×13): qty 1

## 2023-08-05 MED ORDER — ACETAMINOPHEN 650 MG RE SUPP
650.0000 mg | Freq: Four times a day (QID) | RECTAL | Status: DC | PRN
Start: 2023-08-05 — End: 2023-08-10

## 2023-08-05 MED ORDER — ASPIRIN 81 MG PO TBEC
81.0000 mg | DELAYED_RELEASE_TABLET | Freq: Every day | ORAL | Status: DC
  Administered 2023-08-06 – 2023-08-10 (×5): 81 mg via ORAL
  Filled 2023-08-05 (×5): qty 1

## 2023-08-05 MED ORDER — SODIUM CHLORIDE 0.9 % IV SOLN
2.0000 g | INTRAVENOUS | Status: DC
  Administered 2023-08-06 – 2023-08-08 (×3): 2 g via INTRAVENOUS
  Filled 2023-08-05 (×3): qty 20

## 2023-08-05 MED ORDER — GUAIFENESIN ER 600 MG PO TB12
600.0000 mg | ORAL_TABLET | Freq: Two times a day (BID) | ORAL | Status: DC
  Administered 2023-08-05 – 2023-08-10 (×10): 600 mg via ORAL
  Filled 2023-08-05 (×10): qty 1

## 2023-08-05 MED ORDER — SODIUM CHLORIDE 0.9 % IV SOLN
2.0000 g | Freq: Once | INTRAVENOUS | Status: AC
  Administered 2023-08-05: 2 g via INTRAVENOUS
  Filled 2023-08-05: qty 20

## 2023-08-05 MED ORDER — INSULIN ASPART 100 UNIT/ML IJ SOLN
0.0000 [IU] | INTRAMUSCULAR | Status: DC
  Administered 2023-08-06 (×2): 2 [IU] via SUBCUTANEOUS
  Administered 2023-08-07: 3 [IU] via SUBCUTANEOUS
  Filled 2023-08-05: qty 0.09

## 2023-08-05 NOTE — Assessment & Plan Note (Signed)
 Continue PPI.

## 2023-08-05 NOTE — Assessment & Plan Note (Signed)
-  Continue Proscar

## 2023-08-05 NOTE — Assessment & Plan Note (Signed)
Check prealbumin and nutritional consult

## 2023-08-05 NOTE — Assessment & Plan Note (Signed)
-   will replace electrolytes and repeat  check Mg, phos and Ca level and replace as needed Monitor on telemetry   Lab Results  Component Value Date   K 3.2 (L) 08/05/2023     Lab Results  Component Value Date   CREATININE 0.68 08/05/2023   Lab Results  Component Value Date   MG 2.3 08/12/2020   Lab Results  Component Value Date   CALCIUM  8.4 (L) 08/05/2023

## 2023-08-05 NOTE — Subjective & Objective (Signed)
 Patient noted to be more generally fatigued and somewhat more confused per family. At baseline he is alert and oriented x 4.  He had a fall in June resulting in left arm fracture which is currently in sling which has limited his mobility.  Family was concerned that he may have a UTI On arrival to emergency department he found to be oriented x 4 And seems to be actually alert and oriented He does have a mild cough He also has a mild skin tear on his left arm

## 2023-08-05 NOTE — Assessment & Plan Note (Signed)
 Obtain anemia panel  Transfuse for Hg <7 , rapidly dropping or  if symptomatic

## 2023-08-05 NOTE — Assessment & Plan Note (Signed)
 Order sliding scale hold Jardiance 

## 2023-08-05 NOTE — Assessment & Plan Note (Signed)
- -  Patient presenting with   cough,  and infiltrate in   on chest x-ray -Infiltrate on CXR and 2-3 characteristics (fever, leukocytosis, purulent sputum) are consistent with pneumonia.    will admit for treatment of CAP will start on appropriate antibiotic coverage. - Rocephin /azithromycin    Obtain:  sputum cultures,                  Obtain respiratory panel                                   blood cultures and sputum cultures ordered                   strep pneumo UA antigen,                    check for Legionella antigen.                Provide oxygen as needed.

## 2023-08-05 NOTE — Assessment & Plan Note (Signed)
 Will have PT OT eval prior to discharge

## 2023-08-05 NOTE — ED Provider Notes (Signed)
 [name] Provider Note   CSN: [id number] Arrival date & time: 08/05/23  1325     Patient presents with: Weakness and UTI   Francisco [name] is a 89 y.o. male.   89 year old-year-old male with past medical history of diabetes, hypertension, and prostate cancer presenting to the emergency department today with apparent confusion.  The patient is alert and orient x 4 on arrival here.  Family apparently reported the patient has been more confused than normal recently.  Were concerned he may have a UTI.  He tells me has had a cough over the past few days.  He denies any pain with exception of his left arm which is fractured and he is currently in a shoulder immobilizer.   Weakness      Prior to Admission medications   Medication Sig Start Date End Date Taking? Authorizing Provider  cephALEXin  (KEFLEX ) 500 MG capsule Take 1 capsule (500 mg total) by mouth 3 (three) times daily. 10/09/22   Francisco [name], DPM  CREON  3000-9500 units CPEP Take 2 capsules by mouth 3 (three) times daily before meals. 07/26/20   [provider]  docusate sodium  (COLACE) 100 MG capsule Take 1 capsule (100 mg total) by mouth 2 (two) times daily. 06/15/22   Francisco Palma, PA-C  empagliflozin  (JARDIANCE ) 10 MG TABS tablet Take 5 mg by mouth daily.    [provider]  empagliflozin  (JARDIANCE ) 10 MG TABS tablet Take 1 tablet (10 mg total) by mouth daily. 01/08/23     esomeprazole (NEXIUM) 40 MG capsule Take 40 mg by mouth 2 (two) times daily.    [provider]  finasteride  (PROSCAR ) 5 MG tablet Take 5 mg by mouth daily.    [provider]  finasteride  (PROSCAR ) 5 MG tablet Take 1 tablet (5 mg total) by mouth daily. 12/24/22     furosemide  (LASIX ) 20 MG tablet Take 40 mg by mouth daily.    [provider]  furosemide  (LASIX ) 20 MG tablet Take 1 tablet (20 mg total) by mouth daily as needed for leg swelling 01/17/22   Francisco Ade, MD  glucose blood (ACCU-CHEK GUIDE TEST) test strip Use 1 strip as directed to test blood sugar twice daily 07/24/23     glucose blood (ACCU-CHEK GUIDE) test strip Use twice daily as directed 04/18/22     loperamide  (IMODIUM  A-D) 2 MG tablet Take 4 mg by mouth 2 (two) times daily.    [provider]  nystatin-triamcinolone  (MYCOLOG II) cream Apply 1 Application topically daily as needed (itching). 09/12/20   [provider]  oxybutynin  (DITROPAN ) 5 MG tablet Take 5 mg by mouth every 8 (eight) hours as needed for bladder spasms. 05/30/22   [provider]  oxybutynin  (DITROPAN ) 5 MG tablet Take 1 tablet (5 mg total) by mouth every 8 (eight) hours as needed for bladder spasms 05/30/22     oxyCODONE -acetaminophen  (PERCOCET/ROXICET) 5-325 MG tablet Take 1 tablet by mouth every 8 (eight) hours as needed for severe pain (pain score 7-10). 06/19/23   [name], Francisco LABOR, PA-C  Pancrelipase , Lip-Prot-Amyl, (CREON ) 3000-9500 units CPEP Take 2 capsules (6,000 Units total) by mouth 3 (three) times daily before meals (breakfast, lunch, and supper). 10/15/22     Probiotic Product (ALIGN) 4 MG CAPS Take 4 mg by mouth daily.     [provider]  sulfamethoxazole -trimethoprim  (BACTRIM  DS) 800-160 MG tablet Take 1 tablet by mouth 2 (two) times daily. Start the day  prior to foley removal appointment 06/15/22   Francisco Palma, PA-C  sulfamethoxazole -trimethoprim  (BACTRIM  DS) 800-160 MG tablet Take 1 tablet by mouth 2 (two) times daily with food 07/17/22     torsemide  (DEMADEX ) 20 MG tablet Take 1 tablet (20 mg total) by mouth daily. 09/06/22     triamcinolone  cream (KENALOG ) 0.1 % Apply 1 Application topically 3 (three) times daily as needed for swelling 09/06/22       Allergies: Metformin hcl    Review of Systems  Musculoskeletal:        Shoulder pain  Neurological:  Positive for weakness.    Updated Vital Signs BP 100/61 (BP Location: Right Arm)   Pulse 78   Temp (!) 97.5 F  (36.4 C) (Oral)   Resp 18   Ht 5' 8 (1.727 m)   Wt 60 kg   SpO2 96%   BMI 20.11 kg/m   Physical Exam Vitals and nursing note reviewed.   Gen: NAD, chronically ill appearing Eyes: PERRL, EOMI HEENT: no oropharyngeal swelling Neck: trachea midline, no midline tenderness Resp: clear to auscultation bilaterally Card: RRR, no murmurs, rubs, or gallops Abd: nontender, nondistended Extremities: no calf tenderness, no edema Vascular: 2+ radial pulses bilaterally, 2+ DP pulses bilaterally Skin: no rashes, stage 1 pressure ulcer over sacrum with no erythema Psyc: acting appropriately   (all labs ordered are listed, but only abnormal results are displayed) Labs Reviewed  CBC WITH DIFFERENTIAL/PLATELET  COMPREHENSIVE METABOLIC PANEL WITH GFR  URINALYSIS, ROUTINE W REFLEX MICROSCOPIC  AMMONIA    EKG: None  Radiology: DG Chest Portable 1 View Result Date: 08/05/2023 CLINICAL DATA:  Confusion.  Weakness. EXAM: PORTABLE CHEST 1 VIEW COMPARISON:  08/11/2020. FINDINGS: Low lung volume. There are diffuse mild-to-moderately increased interstitial markings, which is nonspecific. Differential diagnosis includes pulmonary edema, chronic interstitial lung disease, atypical pneumonia, etc. Correlate clinically. Bilateral lung fields are otherwise clear. Bilateral costophrenic angles are clear. Stable cardio-mediastinal silhouette. Unfolding of aortic arch noted. No acute osseous abnormalities. The soft tissues are within normal limits. IMPRESSION: Diffuse mild-to-moderately increased interstitial markings, which is nonspecific. Differential diagnosis includes pulmonary edema, chronic interstitial lung disease, atypical pneumonia, etc. Electronically Signed   By: Ree Molt M.D.   On: 08/05/2023 14:39     Procedures   Medications Ordered in the ED  cefTRIAXone  (ROCEPHIN ) 2 g in sodium chloride  0.9 % 100 mL IVPB (has no administration in time range)  azithromycin  (ZITHROMAX ) 500 mg in sodium  chloride 0.9 % 250 mL IVPB (has no administration in time range)                                    Medical Decision Making 89 year old male with past medical history of diabetes, hypertension, and prostate cancer presenting to the emergency department today with concern for intermittent altered mental status.  The patient is alert and orient x 4 here.  Will further evaluate him here with basic labs to evaluate for electrolyte abnormalities as well as a urinalysis to evaluate for UTI.  Will obtain CT scan of his head to eval for intracranial hemorrhage or mass lesion as well as an ammonia level to evaluate for hepatic encephalopathy.  The patient's x-ray shows possible atypical pneumonia.  He is covered with Rocephin  and azithromycin .  His family reports that he has gotten generally weak to the point that he is unable to transfer or help them out at home and they  are having difficulty caring for him.  He will require admission after his workup is complete.  Amount and/or Complexity of Data Reviewed Labs: ordered. Radiology: ordered.        Final diagnoses:  Pneumonia due to infectious organism, unspecified laterality, unspecified part of lung  Generalized weakness    ED Discharge Orders     None          Ula Prentice SAUNDERS, MD 08/05/23 [phone]

## 2023-08-05 NOTE — ED Triage Notes (Signed)
 Pt brought from home for possible UTI. Pt is bed bound and family states that he has been acting more confused. Pt is alert and oriented x4.

## 2023-08-05 NOTE — ED Provider Notes (Signed)
 Signout from Dr.Tee.  89 year old male here with increased weakness and confusion.  Family concerned may have a UTI.  Cough.  Will likely need admission to the hospital.  He is pending lab work and CT head. Physical Exam  BP 100/61 (BP Location: Right Arm)   Pulse 78   Temp (!) 97.5 F (36.4 C) (Oral)   Resp 18   Ht 5' 8 (1.727 m)   Wt 60 kg   SpO2 96%   BMI 20.11 kg/m   Physical Exam  Procedures  Procedures  ED Course / MDM    Medical Decision Making Amount and/or Complexity of Data Reviewed Labs: ordered. Radiology: ordered.   Patient's head CT does not show any acute findings.  Urinalysis unremarkable.  Have put on a hospitalist consult.  1900.  Updated patient on his results.  No other family in the room currently.  He is agreeable to admission.  Discussed with Dr. Silvester [facility] hospitalist who asked if we can also order a COVID swab.     Towana Ozell BROCKS, MD 08/06/23 [phone]

## 2023-08-05 NOTE — H&P (Signed)
 Francisco [name] FMW:[id number] DOB: 01-Dec-1933 DOA: 08/05/2023     PCP: Shepard Ade, MD    Patient arrived to ER on 08/05/23 at 1325 Referred by Attending Towana Ozell BROCKS, MD   Patient coming from:    home Lives  With family      Chief Complaint:   Chief Complaint  Patient presents with   Weakness   UTI    HPI: Francisco [name] is a 89 y.o. male with medical history significant of Dm2, pancreatic mass, recent left arm fracture, BPH, hypertension, GERD and Barrett's esophagus,    Presented with   increased fatigue Patient noted to be more generally fatigued and somewhat more confused per family. At baseline he is alert and oriented x 4.  He had a fall in June resulting in left arm fracture which is currently in sling which has limited his mobility.  Family was concerned that he may have a UTI On arrival to emergency department he found to be oriented x 4 And seems to be actually alert and oriented He does have a mild cough He also has a mild skin tear on his left arm     Denies significant ETOH intake   Does not smoke      Regarding pertinent Chronic problems:       HTN on Lasix       DM 2 -  Lab Results  Component Value Date   HGBA1C 6.6 (H) 06/07/2022   on Jardiance       BPH - on   Proscar      Chronic anemia - baseline hg Hemoglobin & Hematocrit  Recent Labs    06/19/23 1515 08/05/23 1621  HGB 12.2* 9.6*     While in ER:     Chest x-ray showing possible pneumonia    Lab Orders         SARS Coronavirus 2 by RT PCR (hospital order, performed in [facility] hospital lab) *cepheid single result test* Anterior Nasal Swab         CBC with Differential         Comprehensive metabolic panel         Urinalysis, Routine w reflex microscopic -Urine, Clean Catch         Ammonia      CT HEAD   NON acute     CXR - Diffuse mild-to-moderately increased interstitial markings, which is nonspecific. Differential diagnosis includes pulmonary  edema, chronic interstitial lung disease, atypical pneumonia    Following Medications were ordered in ER: Medications  azithromycin  (ZITHROMAX ) 500 mg in sodium chloride  0.9 % 250 mL IVPB (has no administration in time range)  cefTRIAXone  (ROCEPHIN ) 2 g in sodium chloride  0.9 % 100 mL IVPB (2 g Intravenous New Bag/Given 08/05/23 1726)       ED Triage Vitals  Encounter Vitals Group     BP 08/05/23 1605 100/61     Girls Systolic BP Percentile --      Girls Diastolic BP Percentile --      Boys Systolic BP Percentile --      Boys Diastolic BP Percentile --      Pulse Rate 08/05/23 1605 78     Resp 08/05/23 1605 18     Temp 08/05/23 1605 (!) 97.5 F (36.4 C)     Temp Source 08/05/23 1605 Oral     SpO2 08/05/23 1334 98 %     Weight 08/05/23 1343 132 lb 4.4 oz (60 kg)  Height 08/05/23 1343 5' 8 (1.727 m)     Head Circumference --      Peak Flow --      Pain Score 08/05/23 1343 4     Pain Loc --      Pain Education --      Exclude from Growth Chart --   UFJK(75)@     _________________________________________ Significant initial  Findings: Abnormal Labs Reviewed  CBC WITH DIFFERENTIAL/PLATELET - Abnormal; Notable for the following components:      Result Value   WBC 12.4 (*)    RBC 3.60 (*)    Hemoglobin 9.6 (*)    HCT 30.8 (*)    RDW 15.9 (*)    Neutro Abs 10.9 (*)    Abs Immature Granulocytes 0.08 (*)    All other components within normal limits  COMPREHENSIVE METABOLIC PANEL WITH GFR - Abnormal; Notable for the following components:   Sodium 133 (*)    Potassium 3.2 (*)    Glucose, Bld 101 (*)    Calcium  8.4 (*)    Total Protein 5.4 (*)    Albumin  1.7 (*)    Alkaline Phosphatase 165 (*)    All other components within normal limits  URINALYSIS, ROUTINE W REFLEX MICROSCOPIC - Abnormal; Notable for the following components:   Glucose, UA >=500 (*)    All other components within normal limits        ECG: Ordered   BNP (last 3 results) No results for input(s):  BNP in the last 8760 hours.     The recent clinical data is shown below. Vitals:   08/05/23 1334 08/05/23 1343 08/05/23 1605 08/05/23 1909  BP:   100/61 117/68  Pulse:   78 70  Resp:   18 17  Temp:   (!) 97.5 F (36.4 C) 98.4 F (36.9 C)  TempSrc:   Oral   SpO2: 98%  96% 98%  Weight:  60 kg    Height:  5' 8 (1.727 m)        WBC     Component Value Date/Time   WBC 12.4 (H) 08/05/2023 1621   LYMPHSABS 0.8 08/05/2023 1621   MONOABS 0.7 08/05/2023 1621   EOSABS 0.0 08/05/2023 1621   BASOSABS 0.0 08/05/2023 1621          UA   no evidence of UTI      Urine analysis:    Component Value Date/Time   COLORURINE YELLOW 08/05/2023 1618   APPEARANCEUR CLEAR 08/05/2023 1618   LABSPEC 1.020 08/05/2023 1618   PHURINE 6.0 08/05/2023 1618   GLUCOSEU >=500 (A) 08/05/2023 1618   HGBUR NEGATIVE 08/05/2023 1618   BILIRUBINUR NEGATIVE 08/05/2023 1618   KETONESUR NEGATIVE 08/05/2023 1618   PROTEINUR NEGATIVE 08/05/2023 1618   UROBILINOGEN 0.2 05/14/2012 0030   NITRITE NEGATIVE 08/05/2023 1618   LEUKOCYTESUR NEGATIVE 08/05/2023 1618     ABX started Antibiotics Given (last 72 hours)     Date/Time Action Medication Dose Rate   08/05/23 1726 New Bag/Given   cefTRIAXone  (ROCEPHIN ) 2 g in sodium chloride  0.9 % 100 mL IVPB 2 g 200 mL/hr       __________________________________________________________ Recent Labs  Lab 08/05/23 1621  NA 133*  K 3.2*  CO2 25  GLUCOSE 101*  BUN 15  CREATININE 0.68  CALCIUM  8.4*    Cr   stable,   Lab Results  Component Value Date   CREATININE 0.68 08/05/2023   CREATININE 1.03 06/19/2023   CREATININE 0.93 06/17/2022  Recent Labs  Lab 08/05/23 1621  AST 22  ALT 15  ALKPHOS 165*  BILITOT 0.7  PROT 5.4*  ALBUMIN  1.7*   Lab Results  Component Value Date   CALCIUM  8.4 (L) 08/05/2023       Plt: Lab Results  Component Value Date   PLT 244 08/05/2023    Recent Labs  Lab 08/05/23 1621  WBC 12.4*  NEUTROABS 10.9*  HGB  9.6*  HCT 30.8*  MCV 85.6  PLT 244    HG/HCT   stable,  Down *Up from baseline see below    Component Value Date/Time   HGB 9.6 (L) 08/05/2023 1621   HCT 30.8 (L) 08/05/2023 1621   MCV 85.6 08/05/2023 1621     No results for input(s): LIPASE, AMYLASE in the last 168 hours. Recent Labs  Lab 08/05/23 1401  AMMONIA <13      _______________________________________________ Hospitalist was called for admission for   Pneumonia   Generalized weakness     The following Work up has been ordered so far:  Orders Placed This Encounter  Procedures   SARS Coronavirus 2 by RT PCR (hospital order, performed in [facility] hospital lab) *cepheid single result test* Anterior Nasal Swab   CT Head Wo Contrast   DG Chest Portable 1 View   CBC with Differential   Comprehensive metabolic panel   Urinalysis, Routine w reflex microscopic -Urine, Clean Catch   Ammonia   Consult to hospitalist   Airborne and Contact precautions   Insert peripheral IV     OTHER Significant initial  Findings:  labs showing:     DM  labs:  HbA1C: No results for input(s): HGBA1C in the last 8760 hours.     CBG (last 3)  No results for input(s): GLUCAP in the last 72 hours.        Cultures: No results found for: SDES, SPECREQUEST, CULT, REPTSTATUS   Radiological Exams on Admission: CT Head Wo Contrast Result Date: 08/05/2023 CLINICAL DATA:  Headache EXAM: CT HEAD WITHOUT CONTRAST TECHNIQUE: Contiguous axial images were obtained from the base of the skull through the vertex without intravenous contrast. RADIATION DOSE REDUCTION: This exam was performed according to the departmental dose-optimization program which includes automated exposure control, adjustment of the mA and/or kV according to patient size and/or use of iterative reconstruction technique. COMPARISON:  CT 06/19/2023 FINDINGS: Brain: No acute territorial infarction, hemorrhage or intracranial mass. Atrophy and moderate chronic  small vessel ischemic changes of the white matter. 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 Stable ventricle size. Vascular: No hyperdense vessels. Vertebral and carotid vascular calcification Skull: No fracture Sinuses/Orbits: Right mastoid effusion. Sphenoid sinus wall thickening and moderate mucosal thickening. Mild ethmoid mucosal thickening Other: None IMPRESSION: 1. No CT evidence for acute intracranial abnormality. 2. Atrophy and chronic small vessel ischemic changes of the white matter. 3. Chronic sphenoid sinus disease. Right mastoid effusion. Electronically Signed   By: Luke Bun M.D.   On: 08/05/2023 18:32   DG Chest Portable 1 View Result Date: 08/05/2023 CLINICAL DATA:  Confusion.  Weakness. EXAM: PORTABLE CHEST 1 VIEW COMPARISON:  08/11/2020. FINDINGS: Low lung volume. There are diffuse mild-to-moderately increased interstitial markings, which is nonspecific. Differential diagnosis includes pulmonary edema, chronic interstitial lung disease, atypical pneumonia, etc. Correlate clinically. Bilateral lung fields are otherwise clear. Bilateral costophrenic angles are clear. Stable cardio-mediastinal silhouette. Unfolding of aortic arch noted. No acute osseous abnormalities. The soft tissues are within normal limits. IMPRESSION: Diffuse mild-to-moderately increased interstitial markings, which  is nonspecific. Differential diagnosis includes pulmonary edema, chronic interstitial lung disease, atypical pneumonia, etc. Electronically Signed   By: Ree Molt M.D.   On: 08/05/2023 14:39   _______________________________________________________________________________________________________ Latest  Blood pressure 117/68, pulse 70, temperature 98.4 F (36.9 C), resp. rate 17, height 5' 8 (1.727 m), weight 60 kg, SpO2 98%.   Vitals  labs and radiology finding personally reviewed  Review of Systems:    Pertinent positives include:   fatigue,  Constitutional:  No weight loss, night  sweats, Fevers, chills, weight loss  HEENT:  No headaches, Difficulty swallowing,Tooth/dental problems,Sore throat,  No sneezing, itching, ear ache, nasal congestion, post nasal drip,  Cardio-vascular:  No chest pain, Orthopnea, PND, anasarca, dizziness, palpitations.no Bilateral lower extremity swelling  GI:  No heartburn, indigestion, abdominal pain, nausea, vomiting, diarrhea, change in bowel habits, loss of appetite, melena, blood in stool, hematemesis Resp:  no shortness of breath at rest. No dyspnea on exertion, No excess mucus, no productive cough, No non-productive cough, No coughing up of blood.No change in color of mucus.No wheezing. Skin:  no rash or lesions. No jaundice GU:  no dysuria, change in color of urine, no urgency or frequency. No straining to urinate.  No flank pain.  Musculoskeletal:  No joint pain or no joint swelling. No decreased range of motion. No back pain.  Psych:  No change in mood or affect. No depression or anxiety. No memory loss.  Neuro: no localizing neurological complaints, no tingling, no weakness, no double vision, no gait abnormality, no slurred speech, no confusion  All systems reviewed and apart from HOPI all are negative _______________________________________________________________________________________________ Past Medical History:   Past Medical History:  Diagnosis Date   Arthritis    Asthma with allergic rhinitis    BPH (benign prostatic hyperplasia)    DM (diabetes mellitus) (HCC)    Elevated CEA    Elevated prostate specific antigen (PSA)    Fatty liver    GERD (gastroesophageal reflux disease)    History of kidney stones    Hx of adenomatous colonic polyps 02/14/2016   10 adenomas max 15 mm 02/2016 - no recall at his age   Hyperlipidemia    Impotence of organic origin    Neoplasm of uncertain behavior of prostate    Pancreatic cyst FOLLOWED BY DR TERESSA   Personal history of urinary calculi    Pneumonia    Renal calculi  09/19/2011   Right ureteral stone       Past Surgical History:  Procedure Laterality Date   CHOLECYSTECTOMY     done with Whipple   COLONOSCOPY W/ POLYPECTOMY  2018   CYSTOSCOPY WITH RETROGRADE PYELOGRAM, URETEROSCOPY AND STENT PLACEMENT Right 08/19/2019   Procedure: CYSTOSCOPY WITH RETROGRADE PYELOGRAM, URETEROSCOPY, LASER LITHOTRIPSY STENT PLACEMENT FIRST STAGE;  Surgeon: Alvaro Hummer, MD;  Location: [facility];  Service: Urology;  Laterality: Right;  90 MINS   CYSTOSCOPY WITH RETROGRADE PYELOGRAM, URETEROSCOPY AND STENT PLACEMENT Right 09/04/2019   Procedure: CYSTOSCOPY WITH RETROGRADE PYELOGRAM, BASKETING OF STONES URETEROSCOPY AND STENT EXCHANGE;  Surgeon: Alvaro Hummer, MD;  Location: [facility];  Service: Urology;  Laterality: Right;  60 MINS   CYSTOSCOPY WITH URETEROSCOPY  03/14/2011   Procedure: CYSTOSCOPY WITH URETEROSCOPY;  Surgeon: Toribio Neysa Repine, MD;  Location: [facility];  Service: Urology;  Laterality: Right;  2 hours requested for this case  Right Ureter Stent Placement  C-ARM CAMERA DIGITAL URETEROSCOPE     EUS  04/12/2011   Procedure: UPPER ENDOSCOPIC ULTRASOUND (EUS) LINEAR;  Surgeon: Toribio SHAUNNA Cedar, MD;  Location: [facility];  Service: Endoscopy;  Laterality: N/A;  radial linear/pt moved up a hour early by AW , Patty @ [facility] office ok'd & pt was called by AW   HOLMIUM LASER APPLICATION Right 08/19/2019   Procedure: HOLMIUM LASER APPLICATION;  Surgeon: Alvaro Hummer, MD;  Location: [facility];  Service: Urology;  Laterality: Right;   KIDNEY STONE SURGERY  03/2011   PERCUTANEOUS NEPHROSTOLITHOTOMY  1985   WHIPPLE PROCEDURE  12/2011   [facility]   XI ROBOTIC ASSISTED SIMPLE PROSTATECTOMY N/A 06/15/2022   Procedure: XI ROBOTIC ASSISTED SIMPLE PROSTATECTOMY;  Surgeon: Alvaro Hummer KATHEE Mickey., MD;  Location: [facility];  Service: Urology;  Laterality: N/A;    Social History:  Ambulatory with assistant     reports that he quit smoking about 32 years ago.  His smoking use included cigarettes. He started smoking about 52 years ago. He has never used smokeless tobacco. He reports current alcohol  use. He reports that he does not use drugs.     Family History:   Family History  Problem Relation Age of Onset   Diabetes Mother    Diabetes Sister    Cancer Brother        unknown type   Stroke Father    ______________________________________________________________________________________________ Allergies: Allergies  Allergen Reactions   Metformin Hcl Other (See Comments)    GI upset     Prior to Admission medications   Medication Sig Start Date End Date Taking? Authorizing Provider  aspirin  EC 81 MG tablet Take 81 mg by mouth daily. Swallow whole.   Yes [provider]  docusate sodium  (COLACE) 100 MG capsule Take 1 capsule (100 mg total) by mouth 2 (two) times daily. 06/15/22  Yes Dancy, Alan, PA-C  empagliflozin  (JARDIANCE ) 10 MG TABS tablet Take 1 tablet (10 mg total) by mouth daily. Patient taking differently: Take 5 mg by mouth daily. 01/08/23  Yes   esomeprazole (NEXIUM) 40 MG capsule Take 40 mg by mouth 2 (two) times daily.   Yes [provider]  finasteride  (PROSCAR ) 5 MG tablet Take 1 tablet (5 mg total) by mouth daily. 12/24/22  Yes   furosemide  (LASIX ) 20 MG tablet Take 1 tablet (20 mg total) by mouth daily as needed for leg swelling 01/17/22  Yes Shepard Ade, MD  loperamide  (IMODIUM  A-D) 2 MG tablet Take 4 mg by mouth 2 (two) times daily.   Yes [provider]  Pancrelipase , Lip-Prot-Amyl, (CREON ) 3000-9500 units CPEP Take 2 capsules (6,000 Units total) by mouth 3 (three) times daily before meals (breakfast, lunch, and supper). 10/15/22  Yes   Probiotic Product (ALIGN) 4 MG CAPS Take 4 mg by mouth daily.    Yes [provider]  torsemide  (DEMADEX ) 20 MG tablet Take 1 tablet (20 mg total) by mouth daily. 09/06/22  Yes   triamcinolone  cream (KENALOG ) 0.1 % Apply 1 Application topically 3  (three) times daily as needed for swelling 09/06/22  Yes   glucose blood (ACCU-CHEK GUIDE TEST) test strip Use 1 strip as directed to test blood sugar twice daily 07/24/23     glucose blood (ACCU-CHEK GUIDE) test strip Use twice daily as directed 04/18/22     oxybutynin  (DITROPAN ) 5 MG tablet Take 1 tablet (5 mg total) by mouth every 8 (eight) hours as needed for bladder spasms Patient not taking: Reported on 08/05/2023 05/30/22     oxyCODONE  (OXY IR/ROXICODONE ) 5 MG immediate release tablet Take 5 mg by mouth every 6 (six)  hours as needed. Patient not taking: Reported on 08/05/2023 06/21/23   [provider]  oxyCODONE -acetaminophen  (PERCOCET/ROXICET) 5-325 MG tablet Take 1 tablet by mouth every 8 (eight) hours as needed for severe pain (pain score 7-10). Patient not taking: Reported on 08/05/2023 06/19/23   Nora [name] LABOR, PA-C    ___________________________________________________________________________________________________ Physical Exam:    08/05/2023    7:09 PM 08/05/2023    4:05 PM 08/05/2023    1:43 PM  Vitals with BMI  Height   5' 8  Weight   132 lbs 4 oz  BMI   20.12  Systolic 117 100   Diastolic 68 61   Pulse 70 78      1. General:  in No  Acute distress   Chronically ill   -appearing 2. Psychological: Alert and   Oriented 3. Head/ENT:    Dry Mucous Membranes                          Head Non traumatic, neck supple                          Poor Dentition 4. SKIN:  decreased Skin turgor,  Skin clean Dry skin abrasion on left arm Left arm in a brace    5. Heart: Regular rate and rhythm no  Murmur, no Rub or gallop 6. Lungs:   no wheezes or crackles   7. Abdomen: Soft,  non-tender, Non distended bowel sounds present 8. Lower extremities: no clubbing, cyanosis, no  edema 9. Neurologically Grossly intact, moving all 4 extremities equally   10. MSK: Normal range of motion    Chart has been  reviewed  ______________________________________________________________________________________________  Assessment/Plan 89 y.o. male with medical history significant of Dm2, pancreatic mass, recent left arm fracture, BPH, hypertension, GERD and Barrett's esophagus,  Admitted for   Pneumonia   Generalized weakness      Present on Admission:  CAP (community acquired pneumonia)  Benign prostatic hyperplasia  Gastro-esophageal reflux disease without esophagitis  Hypokalemia  Anemia  Debility  Hypoalbuminemia    Benign prostatic hyperplasia Continue Proscar   CAP (community acquired pneumonia)  - -Patient presenting with   cough,  and infiltrate in   on chest x-ray -Infiltrate on CXR and 2-3 characteristics (fever, leukocytosis, purulent sputum) are consistent with pneumonia.    will admit for treatment of CAP will start on appropriate antibiotic coverage. - Rocephin /azithromycin    Obtain:  sputum cultures,                  Obtain respiratory panel                                   blood cultures and sputum cultures ordered                   strep pneumo UA antigen,                    check for Legionella antigen.                Provide oxygen as needed.    Diabetes mellitus type 2, uncomplicated (HCC) Order sliding scale hold Jardiance   Gastro-esophageal reflux disease without esophagitis Continue PPI  Hypokalemia - will replace electrolytes and repeat  check Mg, phos and Ca level and replace as needed Monitor on telemetry  Lab Results  Component Value Date   K 3.2 (L) 08/05/2023     Lab Results  Component Value Date   CREATININE 0.68 08/05/2023   Lab Results  Component Value Date   MG 2.3 08/12/2020   Lab Results  Component Value Date   CALCIUM  8.4 (L) 08/05/2023     Anemia Obtain anemia panel  Transfuse for Hg <7 , rapidly dropping or  if symptomatic   Debility Will have PT OT eval prior to discharge  Hypoalbuminemia Check prealbumin and  nutritional consult   Other plan as per orders.  DVT prophylaxis:  SCD        Code Status:    Code Status: Prior FULL CODE  as per patient  I had personally discussed CODE STATUS with patient  ACP   none    Family Communication:   Family not at  Bedside    Diet diabetic   Disposition Plan:     To home once workup is complete and patient is stable   Following barriers for discharge:                                                         Electrolytes corrected                             , white count improving able to transition to PO antibiotics                             Will need to be able to tolerate PO                                   Consult Orders  (From admission, onward)           Start     Ordered   08/05/23 1839  Consult to hospitalist  Once       Provider:  (Not yet assigned)  Question Answer Comment  Place call to: [facility]   Reason for Consult Admit      08/05/23 1838                               Would benefit from PT/OT eval prior to DC  Ordered                                     Consults called:    NONE   Admission status:  ED Disposition     ED Disposition  Admit   Condition  --   Comment  Hospital Area: [facility] [[id number]]  Level of Care: Progressive [102]  Admit to Progressive based on following criteria: MULTISYSTEM THREATS such as stable sepsis, metabolic/electrolyte imbalance with or without encephalopathy that is responding to early treatment.  May place patient in [facility] at [facility] or [facility] if equivalent level of care is available:: No  Covid Evaluation: Asymptomatic - no recent exposure (last 10 days) testing not required  Diagnosis: CAP (community acquired pneumonia) [[id number]]  Admitting Physician: Trevontae Lindahl [[id number]]  Attending Physician: Trinty Marken [[id number]]  For patients discharging to extended facilities (i.e. SNF, AL, group homes or LTAC) initiate::  Discharge to SNF/Facility Placement COVID-19 Lab Testing Protocol           Obs      Level of care         progressive      Blease Quiver 08/05/2023, 7:57 PM    [facility]     after 2 AM please page floor coverage   If 7AM-7PM, please contact the day team taking care of the patient using [email or web address]

## 2023-08-06 DIAGNOSIS — N401 Enlarged prostate with lower urinary tract symptoms: Secondary | ICD-10-CM | POA: Diagnosis not present

## 2023-08-06 DIAGNOSIS — Z681 Body mass index (BMI) 19 or less, adult: Secondary | ICD-10-CM | POA: Diagnosis not present

## 2023-08-06 DIAGNOSIS — L299 Pruritus, unspecified: Secondary | ICD-10-CM | POA: Diagnosis not present

## 2023-08-06 DIAGNOSIS — E8809 Other disorders of plasma-protein metabolism, not elsewhere classified: Secondary | ICD-10-CM | POA: Diagnosis not present

## 2023-08-06 DIAGNOSIS — L89312 Pressure ulcer of right buttock, stage 2: Secondary | ICD-10-CM | POA: Diagnosis not present

## 2023-08-06 DIAGNOSIS — I10 Essential (primary) hypertension: Secondary | ICD-10-CM | POA: Diagnosis not present

## 2023-08-06 DIAGNOSIS — R5381 Other malaise: Secondary | ICD-10-CM | POA: Diagnosis not present

## 2023-08-06 DIAGNOSIS — Z8546 Personal history of malignant neoplasm of prostate: Secondary | ICD-10-CM | POA: Diagnosis not present

## 2023-08-06 DIAGNOSIS — Z66 Do not resuscitate: Secondary | ICD-10-CM | POA: Diagnosis not present

## 2023-08-06 DIAGNOSIS — K219 Gastro-esophageal reflux disease without esophagitis: Secondary | ICD-10-CM | POA: Diagnosis not present

## 2023-08-06 DIAGNOSIS — F0390 Unspecified dementia without behavioral disturbance: Secondary | ICD-10-CM | POA: Diagnosis not present

## 2023-08-06 DIAGNOSIS — D649 Anemia, unspecified: Secondary | ICD-10-CM | POA: Diagnosis not present

## 2023-08-06 DIAGNOSIS — E785 Hyperlipidemia, unspecified: Secondary | ICD-10-CM | POA: Diagnosis not present

## 2023-08-06 DIAGNOSIS — W19XXXD Unspecified fall, subsequent encounter: Secondary | ICD-10-CM | POA: Diagnosis present

## 2023-08-06 DIAGNOSIS — Z87891 Personal history of nicotine dependence: Secondary | ICD-10-CM | POA: Diagnosis not present

## 2023-08-06 DIAGNOSIS — Z7401 Bed confinement status: Secondary | ICD-10-CM | POA: Diagnosis not present

## 2023-08-06 DIAGNOSIS — S41112A Laceration without foreign body of left upper arm, initial encounter: Secondary | ICD-10-CM | POA: Diagnosis not present

## 2023-08-06 DIAGNOSIS — R4189 Other symptoms and signs involving cognitive functions and awareness: Secondary | ICD-10-CM | POA: Diagnosis not present

## 2023-08-06 DIAGNOSIS — Z1152 Encounter for screening for COVID-19: Secondary | ICD-10-CM | POA: Diagnosis not present

## 2023-08-06 DIAGNOSIS — J189 Pneumonia, unspecified organism: Secondary | ICD-10-CM | POA: Diagnosis not present

## 2023-08-06 DIAGNOSIS — R4182 Altered mental status, unspecified: Secondary | ICD-10-CM | POA: Diagnosis not present

## 2023-08-06 DIAGNOSIS — E43 Unspecified severe protein-calorie malnutrition: Secondary | ICD-10-CM | POA: Diagnosis not present

## 2023-08-06 DIAGNOSIS — J45909 Unspecified asthma, uncomplicated: Secondary | ICD-10-CM | POA: Diagnosis not present

## 2023-08-06 DIAGNOSIS — Z7901 Long term (current) use of anticoagulants: Secondary | ICD-10-CM | POA: Diagnosis not present

## 2023-08-06 DIAGNOSIS — K760 Fatty (change of) liver, not elsewhere classified: Secondary | ICD-10-CM | POA: Diagnosis not present

## 2023-08-06 DIAGNOSIS — E876 Hypokalemia: Secondary | ICD-10-CM | POA: Diagnosis not present

## 2023-08-06 DIAGNOSIS — N400 Enlarged prostate without lower urinary tract symptoms: Secondary | ICD-10-CM | POA: Diagnosis not present

## 2023-08-06 DIAGNOSIS — R531 Weakness: Secondary | ICD-10-CM | POA: Diagnosis not present

## 2023-08-06 DIAGNOSIS — K2270 Barrett's esophagus without dysplasia: Secondary | ICD-10-CM | POA: Diagnosis not present

## 2023-08-06 DIAGNOSIS — D509 Iron deficiency anemia, unspecified: Secondary | ICD-10-CM | POA: Diagnosis not present

## 2023-08-06 DIAGNOSIS — E119 Type 2 diabetes mellitus without complications: Secondary | ICD-10-CM | POA: Diagnosis not present

## 2023-08-06 DIAGNOSIS — K5900 Constipation, unspecified: Secondary | ICD-10-CM | POA: Diagnosis not present

## 2023-08-06 DIAGNOSIS — R627 Adult failure to thrive: Secondary | ICD-10-CM | POA: Diagnosis not present

## 2023-08-06 DIAGNOSIS — Z7984 Long term (current) use of oral hypoglycemic drugs: Secondary | ICD-10-CM | POA: Diagnosis not present

## 2023-08-06 DIAGNOSIS — Z7982 Long term (current) use of aspirin: Secondary | ICD-10-CM | POA: Diagnosis not present

## 2023-08-06 LAB — OSMOLALITY: Osmolality: 288 mosm/kg (ref 275–295)

## 2023-08-06 LAB — CBC
HCT: 27 % — ABNORMAL LOW (ref 39.0–52.0)
Hemoglobin: 8.6 g/dL — ABNORMAL LOW (ref 13.0–17.0)
MCH: 26.9 pg (ref 26.0–34.0)
MCHC: 31.9 g/dL (ref 30.0–36.0)
MCV: 84.4 fL (ref 80.0–100.0)
Platelets: 214 K/uL (ref 150–400)
RBC: 3.2 MIL/uL — ABNORMAL LOW (ref 4.22–5.81)
RDW: 16 % — ABNORMAL HIGH (ref 11.5–15.5)
WBC: 11 K/uL — ABNORMAL HIGH (ref 4.0–10.5)
nRBC: 0 % (ref 0.0–0.2)

## 2023-08-06 LAB — COMPREHENSIVE METABOLIC PANEL WITH GFR
ALT: 15 U/L (ref 0–44)
AST: 20 U/L (ref 15–41)
Albumin: 1.8 g/dL — ABNORMAL LOW (ref 3.5–5.0)
Alkaline Phosphatase: 147 U/L — ABNORMAL HIGH (ref 38–126)
Anion gap: 12 (ref 5–15)
BUN: 12 mg/dL (ref 8–23)
CO2: 21 mmol/L — ABNORMAL LOW (ref 22–32)
Calcium: 8 mg/dL — ABNORMAL LOW (ref 8.9–10.3)
Chloride: 101 mmol/L (ref 98–111)
Creatinine, Ser: 0.63 mg/dL (ref 0.61–1.24)
GFR, Estimated: >60 mL/min (ref >60)
Glucose, Bld: 76 mg/dL (ref 70–99)
Potassium: 3.5 mmol/L (ref 3.5–5.1)
Sodium: 134 mmol/L — ABNORMAL LOW (ref 135–145)
Total Bilirubin: 0.9 mg/dL (ref 0.0–1.2)
Total Protein: 5.1 g/dL — ABNORMAL LOW (ref 6.5–8.1)

## 2023-08-06 LAB — GLUCOSE, CAPILLARY
Glucose-Capillary: 159 mg/dL — ABNORMAL HIGH (ref 70–99)
Glucose-Capillary: 190 mg/dL — ABNORMAL HIGH (ref 70–99)
Glucose-Capillary: 78 mg/dL (ref 70–99)
Glucose-Capillary: 87 mg/dL (ref 70–99)
Glucose-Capillary: 91 mg/dL (ref 70–99)
Glucose-Capillary: 97 mg/dL (ref 70–99)
Glucose-Capillary: 97 mg/dL (ref 70–99)

## 2023-08-06 LAB — PREALBUMIN: Prealbumin: <5 mg/dL — ABNORMAL LOW (ref 18–38)

## 2023-08-06 LAB — PHOSPHORUS: Phosphorus: 2.9 mg/dL (ref 2.5–4.6)

## 2023-08-06 LAB — MAGNESIUM: Magnesium: 1.6 mg/dL — ABNORMAL LOW (ref 1.7–2.4)

## 2023-08-06 LAB — FOLATE: Folate: 17.7 ng/mL (ref >5.9)

## 2023-08-06 LAB — STREP PNEUMONIAE URINARY ANTIGEN: Strep Pneumo Urinary Antigen: NEGATIVE

## 2023-08-06 LAB — OSMOLALITY, URINE: Osmolality, Ur: 609 mosm/kg (ref 300–900)

## 2023-08-06 LAB — VITAMIN B12: Vitamin B-12: 881 pg/mL (ref 180–914)

## 2023-08-06 MED ORDER — MAGNESIUM SULFATE 2 GM/50ML IV SOLN
2.0000 g | Freq: Once | INTRAVENOUS | Status: AC
  Administered 2023-08-06: 2 g via INTRAVENOUS
  Filled 2023-08-06: qty 50

## 2023-08-06 MED ORDER — ADULT MULTIVITAMIN W/MINERALS CH
1.0000 | ORAL_TABLET | Freq: Every day | ORAL | Status: DC
  Administered 2023-08-07 – 2023-08-10 (×4): 1 via ORAL
  Filled 2023-08-06 (×4): qty 1

## 2023-08-06 MED ORDER — SODIUM CHLORIDE 0.9 % IV SOLN: INTRAVENOUS | Status: DC

## 2023-08-06 MED ORDER — POLYETHYLENE GLYCOL 3350 17 G PO PACK
17.0000 g | PACK | Freq: Every day | ORAL | Status: DC
  Administered 2023-08-06 – 2023-08-08 (×3): 17 g via ORAL
  Filled 2023-08-06 (×4): qty 1

## 2023-08-06 MED ORDER — ENSURE PLUS HIGH PROTEIN PO LIQD
237.0000 mL | Freq: Three times a day (TID) | ORAL | Status: DC
  Administered 2023-08-06 – 2023-08-08 (×5): 237 mL via ORAL

## 2023-08-06 MED ORDER — FERROUS SULFATE 325 (65 FE) MG PO TABS
325.0000 mg | ORAL_TABLET | Freq: Every day | ORAL | Status: DC
  Administered 2023-08-07 – 2023-08-10 (×4): 325 mg via ORAL
  Filled 2023-08-06 (×4): qty 1

## 2023-08-06 MED ORDER — GLUCERNA SHAKE PO LIQD
237.0000 mL | Freq: Three times a day (TID) | ORAL | Status: DC
  Filled 2023-08-06: qty 237

## 2023-08-06 NOTE — Progress Notes (Signed)
 PT Cancellation Note  Patient Details Name: Francisco [name] MRN: [id number] DOB: November 20, 1933   Cancelled Treatment:    Reason Eval/Treat Not Completed: Other (comment); noted OT session recently with limited participation.  Will check later in the day to see if able to participate more.   [name] 08/06/2023, 10:37 AM [name], PT Acute Rehabilitation Services Office:[phone] 08/06/2023

## 2023-08-06 NOTE — Progress Notes (Addendum)
 PROGRESS NOTE  Francisco [name]  FMW:[id number] DOB: 08-21-1933 DOA: 08/05/2023 PCP: Shepard Ade, MD   Brief Narrative: Patient is a 89 year old male with history of diabetes type 2, pancreatic mass s/p resection, recent left arm fracture, BPH, hypertension, GERD, Barrett'sesophagus who was who presented with fatigue from home .  As per the report, he has been increasingly more weak, confused as per family.  At baseline, he is alert and oriented.  There was concern for UTI.  On presentation,he was alert and oriented.  Complained of some mild cough.  Had a mild skin tear on the left arm.  Labs are potassium 3.2, hemoglobin of 9.6, iron level of 21, procalcitonin of 2.4.  UA was not suspicious for UTI.  Chest x-ray showed diffuse mild to moderate increased interstitial markings.  COVID/flu/RSV negative.  Suspected to have community-acquired pneumonia and was started antibiotics.  PT/ OT evaluation pending  Assessment & Plan:  Principal Problem:   CAP (community acquired pneumonia) Active Problems:   Benign prostatic hyperplasia   Diabetes mellitus type 2, uncomplicated (HCC)   Gastro-esophageal reflux disease without esophagitis   Hypokalemia   Anemia   Debility   Hypoalbuminemia  Community-acquired pneumonia: Presented with weakness, cough, confusion.  Chest x-ray showed increased interstitial markings, nonspecific finding.  Afebrile on presentation.  Saturating fine on room air.  Continue ceftriaxone  and azithromycin  for today.  Follow-up sputum culture.  RSV/flu/COVID-negative. Negative strep pneumo urine antigen, pending Legionella antigen.  Does not look in any kind of respiratory distress this morning.  Speaking in full sentences  Generalized weakness/confusion: Likely secondary to pneumonia.  PT/OT consulted.  CT head did not show any acute intracranial abnormalities but showed atrophy and chronic small vessel ischemic changes of the white matter.  Early dementia not ruled  out.  History of BPH: Continue Proscar   Hypomagnesemia: Magnesium  supplemented  Diabetes type 2: Currently on sliding scale.  On Jardiance  at home  GERD: Continue PPI  Hypokalemia:Supplemented and corrected  Iron deficiency normocytic anemia: Hemoglobin currently in the range of 8.  Iron panel showed low iron.  Started on oral iron supplementation.  No report of hematochezia or melena.  Low suspicion for GI bleed.  Will check FOBT  Recent history of left upper extremity fracture: Happened in  June 2025, follows with orthopedics.  Currently in sling.  Also has left knee problem and brace was recommended on ambulation.  PT consulted  Severe protein calorie malnutrition/low albumin : Albumin  of just 1.8.  Added Glucerna.  Dietitian consulted.          DVT prophylaxis:SCDs Start: 08/05/23 2004     Code Status: Full Code  Family Communication: Discussed with daughter and his spouse at bedside on 8/5  Patient status:Inpatient  Patient is from :Home  Anticipated discharge un:[facility] versus SNF  Estimated DC date:1-2 days   Consultants: None  Procedures:None  Antimicrobials:  Anti-infectives (From admission, onward)    Start     Dose/Rate Route Frequency Ordered Stop   08/06/23 1800  cefTRIAXone  (ROCEPHIN ) 2 g in sodium chloride  0.9 % 100 mL IVPB        2 g 200 mL/hr over 30 Minutes Intravenous Every 24 hours 08/05/23 1938 08/11/23 1759   08/06/23 1800  azithromycin  (ZITHROMAX ) 500 mg in sodium chloride  0.9 % 250 mL IVPB        500 mg 250 mL/hr over 60 Minutes Intravenous Every 24 hours 08/05/23 1938 08/11/23 1759   08/05/23 1645  cefTRIAXone  (ROCEPHIN ) 2 g in sodium chloride   0.9 % 100 mL IVPB        2 g 200 mL/hr over 30 Minutes Intravenous  Once 08/05/23 1641 08/05/23 1937   08/05/23 1645  azithromycin  (ZITHROMAX ) 500 mg in sodium chloride  0.9 % 250 mL IVPB        500 mg 250 mL/hr over 60 Minutes Intravenous  Once 08/05/23 1641 08/06/23 0547        Subjective: Patient seen and examined at bedside today.  Hemodynamically stable.  On room air.  Not coughing.  Speaking on on full sentences.  Told the correct month but not aware of the day.  Not agitated.  Has swelling on the left upper extremity.  Denies abdomen pain, nausea vomiting or chest pain.  Long discussion held with the daughter and his spouse about the management plan.  Objective: Vitals:   08/05/23 2115 08/06/23 0101 08/06/23 0518 08/06/23 0912  BP: 134/71 111/69 105/65 118/68  Pulse: 80 83 73 79  Resp: 20 18 18 20   Temp: 97.9 F (36.6 C) 98.6 F (37 C) (!) 97.4 F (36.3 C) 98 F (36.7 C)  TempSrc: Oral Oral Oral Oral  SpO2: 100% 96% 97% 97%  Weight: 56.5 kg     Height: 5' 8 (1.727 m)       Intake/Output Summary (Last 24 hours) at 08/06/2023 1024 Last data filed at 08/06/2023 9094 Gross per 24 hour  Intake 1581.29 ml  Output 450 ml  Net 1131.29 ml   Filed Weights   08/05/23 1343 08/05/23 2115  Weight: 60 kg 56.5 kg    Examination:  General exam: Overall comfortable, not in distress, pleasant black male HEENT: PERRL Respiratory system:  no wheezes or crackles  Cardiovascular system: S1 & S2 heard, RRR.  Gastrointestinal system: Abdomen is nondistended, soft and nontender. Central nervous system: Alert and mostly oriented Extremities: No edema, no clubbing ,no cyanosis, sling on the left upper extremity Skin: No rashes, no ulcers,no icterus     Data Reviewed: I have personally reviewed following labs and imaging studies  CBC: Recent Labs  Lab 08/05/23 1621 08/06/23 0457  WBC 12.4* 11.0*  NEUTROABS 10.9*  --   HGB 9.6* 8.6*  HCT 30.8* 27.0*  MCV 85.6 84.4  PLT 244 214   Basic Metabolic Panel: Recent Labs  Lab 08/05/23 1621 08/06/23 0457  NA 133* 134*  K 3.2* 3.5  CL 99 101  CO2 25 21*  GLUCOSE 101* 76  BUN 15 12  CREATININE 0.68 0.63  CALCIUM  8.4* 8.0*  MG 1.8 1.6*  PHOS 2.6 2.9     Recent Results (from the past 240 hours)   SARS Coronavirus 2 by RT PCR (hospital order, performed in [facility] hospital lab) *cepheid single result test* Anterior Nasal Swab     Status: None   Collection Time: 08/05/23  7:32 PM   Specimen: Anterior Nasal Swab  Result Value Ref Range Status   SARS Coronavirus 2 by RT PCR NEGATIVE NEGATIVE Final    Comment: (NOTE) SARS-CoV-2 target nucleic acids are NOT DETECTED.  The SARS-CoV-2 RNA is generally detectable in upper and lower respiratory specimens during the acute phase of infection. The lowest concentration of SARS-CoV-2 viral copies this assay can detect is 250 copies / mL. A negative result does not preclude SARS-CoV-2 infection and should not be used as the sole basis for treatment or other patient management decisions.  A negative result may occur with improper specimen collection / handling, submission of specimen other than nasopharyngeal swab, presence  of viral mutation(s) within the areas targeted by this assay, and inadequate number of viral copies (<250 copies / mL). A negative result must be combined with clinical observations, patient history, and epidemiological information.  Fact Sheet for Patients:   RoadLapTop.co.za  Fact Sheet for Healthcare Providers: http://kim-miller.com/  This test is not yet approved or  cleared by the [place]  FDA and has been authorized for detection and/or diagnosis of SARS-CoV-2 by FDA under an Emergency Use Authorization (EUA).  This EUA will remain in effect (meaning this test can be used) for the duration of the COVID-19 declaration under Section 564(b)(1) of the Act, 21 U.S.C. section 360bbb-3(b)(1), unless the authorization is terminated or revoked sooner.  Performed at [facility], [address], [place], [place] [address]   Culture, blood (x 2)     Status: None (Preliminary result)   Collection Time: 08/05/23  9:26 PM   Specimen: BLOOD  Result  Value Ref Range Status   Specimen Description   Final    BLOOD BLOOD RIGHT ARM Performed at [facility], [address] [place], [place] [address]    Special Requests   Final    BOTTLES DRAWN AEROBIC ONLY Blood Culture results may not be optimal due to an inadequate volume of blood received in culture bottles Performed at [facility], [address] [place], [place] [address]    Culture   Final    NO GROWTH < 12 HOURS Performed at [facility], [address], [place], [place] [address]    Report Status PENDING  Incomplete  Culture, blood (x 2)     Status: None (Preliminary result)   Collection Time: 08/05/23  9:30 PM   Specimen: BLOOD  Result Value Ref Range Status   Specimen Description   Final    BLOOD BLOOD RIGHT HAND Performed at [facility], [address] [place], [place] [address]    Special Requests   Final    BOTTLES DRAWN AEROBIC ONLY Blood Culture results may not be optimal due to an inadequate volume of blood received in culture bottles Performed at [facility], [address], [place], [place] [address]    Culture   Final    NO GROWTH < 12 HOURS Performed at [facility], [address] [place], [place] [address]    Report Status PENDING  Incomplete     Radiology Studies: CT Head Wo Contrast Result Date: 08/05/2023 CLINICAL DATA:  Headache EXAM: CT HEAD WITHOUT CONTRAST TECHNIQUE: Contiguous axial images were obtained from the base of the skull through the vertex without intravenous contrast. RADIATION DOSE REDUCTION: This exam was performed according to the departmental dose-optimization program which includes automated exposure control, adjustment of the mA and/or kV according to patient size and/or use of iterative reconstruction technique. COMPARISON:  CT 06/19/2023 FINDINGS: Brain: No acute territorial infarction, hemorrhage or intracranial mass. Atrophy and moderate  chronic small vessel ischemic changes of the white matter. 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 Stable ventricle size. Vascular: No hyperdense vessels. Vertebral and carotid vascular calcification Skull: No fracture Sinuses/Orbits: Right mastoid effusion. Sphenoid sinus wall thickening and moderate mucosal thickening. Mild ethmoid mucosal thickening Other: None IMPRESSION: 1. No CT evidence for acute intracranial abnormality. 2. Atrophy and chronic small vessel ischemic changes of the white matter. 3. Chronic sphenoid sinus disease. Right mastoid effusion. Electronically Signed   By: Luke Bun M.D.   On: 08/05/2023 18:32  DG Chest Portable 1 View Result Date: 08/05/2023 CLINICAL DATA:  Confusion.  Weakness. EXAM: PORTABLE CHEST 1 VIEW COMPARISON:  08/11/2020. FINDINGS: Low lung volume. There are diffuse mild-to-moderately increased interstitial markings, which is nonspecific. Differential diagnosis includes pulmonary edema, chronic interstitial lung disease, atypical pneumonia, etc. Correlate clinically. Bilateral lung fields are otherwise clear. Bilateral costophrenic angles are clear. Stable cardio-mediastinal silhouette. Unfolding of aortic arch noted. No acute osseous abnormalities. The soft tissues are within normal limits. IMPRESSION: Diffuse mild-to-moderately increased interstitial markings, which is nonspecific. Differential diagnosis includes pulmonary edema, chronic interstitial lung disease, atypical pneumonia, etc. Electronically Signed   By: Ree Molt M.D.   On: 08/05/2023 14:39    Scheduled Meds:  aspirin  EC  81 mg Oral Daily   finasteride   5 mg Oral Daily   guaiFENesin   600 mg Oral BID   insulin  aspart  0-9 Units Subcutaneous Q4H   lipase/protease/amylase  12,000 Units Oral TID AC   pantoprazole   40 mg Oral Daily   thiamine  (VITAMIN B1) injection  100 mg Intravenous Daily   Continuous Infusions:  azithromycin      cefTRIAXone  (ROCEPHIN )  IV       LOS: 0  days   Ivonne Mustache, MD [facility] P8/05/2023, 10:24 AM

## 2023-08-06 NOTE — Consult Note (Signed)
 WOC Nurse Consult Note: Reason for Consult: L arm skin tear  Wound type: 1.  Stage 2 Pressure Injury R buttock  red and moist  2.  Full thickness skin tear L arm traumatic red moist  3.  Full thickness L anterior foot 100% dry scabbed  Pressure Injury POA: yes, buttock only pressure injury  Measurement:see nursing flowsheet  Wound bed:as above  Drainage (amount, consistency, odor) serosanguinous L arm  Periwound: ecchymosis noted to arm  Dressing procedure/placement/frequency:  Stage 2 Pressure Injury buttock place silicone foam and lift daily to assess per protocol.  Cleanse L arm skin tear with NS, apply vaseline gauze (Lawson [id number]) to wound bed every other day and secure with Kerlix roll gauze.  SOAK DRESSING WITH NS IF ADHERED TO WOUND BED FOR ATRAUMATIC REMOVAL.  Paint L dorsal foot scab with Betadine daily and can leave open to air.    POC discussed with bedside nurse. WOC team will not follow. Re-consult if further needs arise.   Thank you,    Powell Bar MSN, RN-BC, [employer]

## 2023-08-06 NOTE — Progress Notes (Signed)
 Initial Nutrition Assessment  DOCUMENTATION CODES:   Severe malnutrition in context of acute illness/injury  INTERVENTION:  - Liberalize to a Regular diet to provide the widest variety of menu options and to avoid restricting intake.  - Family planning to bring in food to support intake. - Ensure Plus High Protein po TID, each supplement provides 350 kcal and 20 grams of protein. - Encourage intake of 3 meals a day in addition to supplements.  - Add Multivitamin with minerals daily. - Monitor weight trends.    NUTRITION DIAGNOSIS:   Severe Malnutrition related to acute illness as evidenced by energy intake < or equal to 50% for > or equal to 5 days, percent weight loss (6% in 1.5 months).  GOAL:   Patient will meet greater than or equal to 90% of their needs  MONITOR:   PO intake, Supplement acceptance, Labs, Weight trends  REASON FOR ASSESSMENT:   Consult Assessment of nutrition requirement/status  ASSESSMENT:   89 y.o. male with PMH significant of DM2, pancreatic mass, recent left arm fracture, BPH, HTN, GERD and Barrett's esophagus who presented with increased fatigue and confusion. Admitted for CAP.  Patient in bed at time of visit, wife and daughter at bedside and provided all nutrition history.   UBW reported to be 129#. Weight had been stable until patient had a fall in June where he broke his arm and knee. Since that time he has been mostly bedbound and not eating well.  EMR confirms these trends. Patient weighed at 132# in June and now weighed at 124#.  This is an 8# or 6% weight loss in 1.5 months.  Wife states the patient used to eat all the time prior to the fall. Would eat 3 meals a day plus snack constantly.  Since the fall, he has been trying to eat meals but they have been much smaller portions and he has not been snacking.  He has been drinking a Fairlife protein shake (150 kcals, 30g protein) once daily at home.   When asked how he has eaten so far this  admission, patient reports the food is garbage and he will not be eating any of it while he is here. Family are planning to bring him in food to eat.  Discussed will at least liberalize diet from Carb Modified to Regular to avoid restricting intake if patient is agreeable to try a meal while admitted.   Thankfully, he was given an Ensure this morning and reports liking it. Agreeable to receive three times per day. Encouraged patient to consume all supplements as tolerated.    Medications reviewed and include: 325mg  ferrous sulfate , 12000 units CREON  TID, 100mg  thiamine   Labs reviewed:  Na 134 HA1C 6.6 (as of June 2024) Blood Glucose 78-98 x24 hours   NUTRITION - FOCUSED PHYSICAL EXAM:  Unable to perform, patient angry during visit. Will attempt at next visit  Diet Order:   Diet Order             Diet regular Room service appropriate? Yes; Fluid consistency: Thin  Diet effective now                   EDUCATION NEEDS:  Education needs have been addressed  Skin:  Skin Assessment: Skin Integrity Issues: Skin Integrity Issues:: Stage II Stage II: Right Buttocks  Last BM:  8/3  Height:  Ht Readings from Last 1 Encounters:  08/05/23 5' 8 (1.727 m)   Weight:  Wt Readings from Last 1 Encounters:  08/05/23 56.5 kg    BMI:  Body mass index is 18.94 kg/m.  Estimated Nutritional Needs:  Kcal:  1700-1850 kcals Protein:  85-95 grams Fluid:  >/= 1.7L    Trude Ned RD, LDN Contact via Secure Chat.

## 2023-08-06 NOTE — Progress Notes (Signed)
 Patient's heart rate now decreased to 98-100. Maintain current plan of care and monitor Patient closley

## 2023-08-06 NOTE — Progress Notes (Signed)
 Physical Therapy Evaluation Patient Details Name: Francisco [name] MRN: [id number] DOB: 1933-05-14 Today's Date: 08/06/2023  History of Present Illness  Pt is a 89 yr old male who presented 08/05/23 due to weakness and UTI. CXR showed pulmonary edema,chronic interstitial lung disease, atypical pneumonia.  PMH:  Dm2, pancreatic mass, recent left arm fracture in sling (6/25), BPH, hypertension, GERD and Barrett's esophagus  Clinical Impression  Patient presents with decreased mobility due to generalized weakness, decreased activity tolerance, decreased balance and high risk for falls.  Previously at home since fall in June with shoulder fracture though somewhat limited mobility per RN (family not available).  Able to perform supine to sit today with mod A using bed features, mod to max A to stand from elevated bed height and ambulated right at bedside with HHA about 6' with wide BOS, poor foot clearance and mod A for balance.  HR max 136 with mobility.  Patient will benefit from skilled PT in the acute setting and may need post-acute inpatient rehab prior to d/c home with family support.        If plan is discharge home, recommend the following: A lot of help with bathing/dressing/bathroom;Two people to help with walking and/or transfers;Assist for transportation;Help with stairs or ramp for entrance   Can travel by private vehicle   No    Equipment Recommendations None recommended by PT  Recommendations for Other Services       Functional Status Assessment Patient has had a recent decline in their functional status and/or demonstrates limited ability to make significant improvements in function in a reasonable and predictable amount of time     Precautions / Restrictions Precautions Precautions: Fall Precaution/Restrictions Comments: Pt had a old LUE fx in June 2025 and per chart to be in sling      Mobility  Bed Mobility Overal bed mobility: Needs Assistance Bed Mobility: Supine to Sit,  Sit to Supine     Supine to sit: HOB elevated, Mod assist, Used rails Sit to supine: Mod assist, HOB elevated, Used rails, +2 for physical assistance   General bed mobility comments: moving legs off bed on his own, some help to lift trunk and pt trying on his own to scoot hips forward though finally needing help; to supine assist for guiding legs onto bed and RN in the room to help scoot up in bed    Transfers Overall transfer level: Needs assistance Equipment used: 1 person hand held assist Transfers: Sit to/from Stand Sit to Stand: From elevated surface, Max assist           General transfer comment: two attempts and elevating bed more to allow for sit to stand with lifting help and cues for hip and knee extension    Ambulation/Gait Ambulation/Gait assistance: Mod assist Gait Distance (Feet): 6 Feet Assistive device: 1 person hand held assist Gait Pattern/deviations: Step-to pattern, Decreased stride length, Trunk flexed, Wide base of support, Shuffle       General Gait Details: forward gait to end of bed then turned and walked back to [facility]; assist for balance, cues for hand placement to get to [facility] and for lateral weight shifts  Stairs            Wheelchair Mobility     Tilt Bed    Modified Rankin (Stroke Patients Only)       Balance Overall balance assessment: Needs assistance Sitting-balance support: Feet supported Sitting balance-Leahy Scale: Fair     Standing balance support: Single extremity  supported Standing balance-Leahy Scale: Poor Standing balance comment: mod progressing to min A with HHA for static standing balance                             Pertinent Vitals/Pain Pain Assessment Pain Assessment: Faces Faces Pain Scale: Hurts a little bit Pain Location: R arm when touched at upper arm (states my skin is rotten) Pain Descriptors / Indicators: Discomfort, Sore, Tender Pain Intervention(s): Monitored during session,  Repositioned    Home Living Family/patient expects to be discharged to:: Private residence Living Arrangements: Spouse/significant other Available Help at Discharge: Family Type of Home: House Home Access: Level entry       Home Layout: One level Home Equipment: Agricultural consultant (2 wheels);Cane - single point Additional Comments: no family present, though RN reports had been a while since he was ambulatory    Prior Function                       Extremity/Trunk Assessment   Upper Extremity Assessment Upper Extremity Assessment: Defer to OT evaluation    Lower Extremity Assessment Lower Extremity Assessment: Generalized weakness    Cervical / Trunk Assessment Cervical / Trunk Assessment: Kyphotic  Communication   Communication Communication: Impaired Factors Affecting Communication: Hearing impaired (had hearing aides in)    Cognition Arousal: Alert Behavior During Therapy: Agitated   PT - Cognitive impairments: No family/caregiver present to determine baseline                       PT - Cognition Comments: not formally tested, seems oriented to situation and frustrated he keeps getting interrupted when trying to rest though states can't get any rest in the hospital Following commands: Impaired Following commands impaired: Only follows one step commands consistently, Follows one step commands with increased time     Cueing Cueing Techniques: Verbal cues     General Comments General comments (skin integrity, edema, etc.): HR max 136 with ambulation, pt denied symptoms, down to 106 after to supine and positioned, RN aware    Exercises     Assessment/Plan    PT Assessment Patient needs continued PT services  PT Problem List Decreased strength;Decreased balance;Decreased knowledge of use of DME;Decreased mobility;Decreased activity tolerance;Decreased safety awareness       PT Treatment Interventions DME instruction;Functional mobility  training;Balance training;Patient/family education;Modalities;Gait training;Therapeutic activities;Wheelchair mobility training;Therapeutic exercise    PT Goals (Current goals can be found in the Care Plan section)  Acute Rehab PT Goals Patient Stated Goal: get stronger PT Goal Formulation: With patient Time For Goal Achievement: 08/20/23 Potential to Achieve Goals: Fair    Frequency Min 2X/week     Co-evaluation               AM-PAC PT 6 Clicks Mobility  Outcome Measure Help needed turning from your back to your side while in a flat bed without using bedrails?: A Lot Help needed moving from lying on your back to sitting on the side of a flat bed without using bedrails?: Total Help needed moving to and from a bed to a chair (including a wheelchair)?: Total Help needed standing up from a chair using your arms (e.g., wheelchair or bedside chair)?: Total Help needed to walk in hospital room?: Total Help needed climbing 3-5 steps with a railing? : Total 6 Click Score: 7    End of Session Equipment Utilized During Treatment: Gait  belt Activity Tolerance: Patient limited by fatigue;Treatment limited secondary to medical complications (Comment) Patient left: in bed;with call bell/phone within reach;with bed alarm set Nurse Communication: Mobility status PT Visit Diagnosis: Other abnormalities of gait and mobility (R26.89);Muscle weakness (generalized) (M62.81)    Time: 8494-8471 PT Time Calculation (min) (ACUTE ONLY): 23 min   Charges:   PT Evaluation $PT Eval Moderate Complexity: 1 Mod PT Treatments $Therapeutic Activity: 8-22 mins PT General Charges $$ ACUTE PT VISIT: 1 Visit         [name], PT Acute Rehabilitation Services Office:[phone] 08/06/2023   [name] 08/06/2023, 4:14 PM

## 2023-08-06 NOTE — Evaluation (Signed)
 Occupational Therapy Evaluation Patient Details Name: GEMINI BEAUMIER MRN: [id number] DOB: 1933-04-23 Today's Date: 08/06/2023   History of Present Illness   Pt is a 89 yr old male who presented 08/05/23 due to weakness and UTI. CXR showed pulmonary edema,chronic interstitial lung disease, atypical pneumonia.  PMH:  Dm2, pancreatic mass, recent left arm fracture in sling (6/25), BPH, hypertension, GERD and Barrett's esophagus     Clinical Impressions Pt presented in bed an no family present to determine PLOF. Pt at this time needed encouragement to participate but then agreed. AT this time needed total assist with reposition of sling for LUE. He then agreed with max assist to go from supine to sitting and then completed lateral scooting to [facility] with max assist and then requested to go max into supine with max assist. Patient will benefit from continued inpatient follow up therapy, <3 hours/day.    If plan is discharge home, recommend the following:   Two people to help with walking and/or transfers;A lot of help with bathing/dressing/bathroom;Assistance with cooking/housework;Assistance with feeding;Direct supervision/assist for medications management;Direct supervision/assist for financial management;Assist for transportation;Help with stairs or ramp for entrance;Supervision due to cognitive status     Functional Status Assessment   Patient has had a recent decline in their functional status and demonstrates the ability to make significant improvements in function in a reasonable and predictable amount of time.     Equipment Recommendations    (TBD)     Recommendations for Other Services         Precautions/Restrictions   Precautions Precautions: Fall Recall of Precautions/Restrictions: Impaired Precaution/Restrictions Comments: Pt had a old LUE fx in June 2025 and per chart to be in sling     Mobility Bed Mobility Overal bed mobility: Needs Assistance Bed Mobility:  Supine to Sit, Sit to Supine     Supine to sit: Max assist Sit to supine: Max assist        Transfers                          Balance Overall balance assessment: Needs assistance Sitting-balance support: Feet supported Sitting balance-Leahy Scale: Fair                                     ADL either performed or assessed with clinical judgement   ADL Overall ADL's : Needs assistance/impaired Eating/Feeding: Minimal assistance;Sitting   Grooming: Moderate assistance;Sitting   Upper Body Bathing: Maximal assistance;Sitting   Lower Body Bathing: Total assistance;Sitting/lateral leans;Bed level   Upper Body Dressing : Maximal assistance;Sitting   Lower Body Dressing: Total assistance;Bed level       Toileting- Clothing Manipulation and Hygiene: Total assistance               Vision         Perception         Praxis         Pertinent Vitals/Pain Pain Assessment Pain Assessment: Faces Faces Pain Scale: Hurts little more Pain Location: L shoulder Pain Descriptors / Indicators: Aching, Discomfort, Grimacing, Guarding Pain Intervention(s): Limited activity within patient's tolerance, Monitored during session     Extremity/Trunk Assessment Upper Extremity Assessment Upper Extremity Assessment: LUE deficits/detail LUE Deficits / Details: Per chart : LUE fx in June 2025. No new ortho notes. Skin tears on LUE and presented in sling. LUE Sensation: WNL LUE Coordination: decreased fine  motor;decreased gross motor   Lower Extremity Assessment Lower Extremity Assessment: Defer to PT evaluation       Communication Communication Factors Affecting Communication: Hearing impaired   Cognition Arousal: Alert Behavior During Therapy:  (Pt was slightly agitated when coming into room as upset about being in the hospital) Cognition: History of cognitive impairments, No family/caregiver present to determine baseline             OT -  Cognition Comments: Per chart pt came in due to having increase in confusion but family present                 Following commands: Impaired Following commands impaired: Follows one step commands with increased time     Cueing  General Comments   Cueing Techniques: Verbal cues      Exercises     Shoulder Instructions      Home Living Family/patient expects to be discharged to:: Private residence Living Arrangements: Spouse/significant other Available Help at Discharge: Family Type of Home: House Home Access: Level entry     Home Layout: One level               Home Equipment: Agricultural consultant (2 wheels);Cane - single point          Prior Functioning/Environment                      OT Problem List: Decreased strength;Decreased activity tolerance;Impaired balance (sitting and/or standing);Decreased safety awareness;Decreased knowledge of use of DME or AE;Decreased knowledge of precautions;Pain   OT Treatment/Interventions: Self-care/ADL training;Therapeutic exercise;DME and/or AE instruction;Therapeutic activities;Patient/family education;Balance training      OT Goals(Current goals can be found in the care plan section)   Acute Rehab OT Goals Patient Stated Goal: to get better OT Goal Formulation: With patient Time For Goal Achievement: 08/20/23 Potential to Achieve Goals: Good   OT Frequency:  Min 2X/week    Co-evaluation              AM-PAC OT 6 Clicks Daily Activity     Outcome Measure Help from another person eating meals?: A Little Help from another person taking care of personal grooming?: A Lot Help from another person toileting, which includes using toliet, bedpan, or urinal?: Total Help from another person bathing (including washing, rinsing, drying)?: Total Help from another person to put on and taking off regular upper body clothing?: A Lot Help from another person to put on and taking off regular lower body clothing?:  Total 6 Click Score: 10   End of Session Equipment Utilized During Treatment: Gait belt Nurse Communication: Mobility status  Activity Tolerance: Patient tolerated treatment well Patient left: in bed;with call bell/phone within reach;with bed alarm set  OT Visit Diagnosis: Unsteadiness on feet (R26.81);Other abnormalities of gait and mobility (R26.89);Repeated falls (R29.6);Muscle weakness (generalized) (M62.81);Pain Pain - Right/Left: Left Pain - part of body: Shoulder                Time: 9140-9060 OT Time Calculation (min): 40 min Charges:  OT General Charges $OT Visit: 1 Visit OT Evaluation $OT Eval Moderate Complexity: 1 Mod OT Treatments $Self Care/Home Management : 23-37 mins  Warrick POUR OTR/L  Acute Rehab Services  [phone] office number   Warrick Berber 08/06/2023, 9:50 AM

## 2023-08-06 NOTE — Progress Notes (Signed)
 Patient with c/o chills. VS 98.6 BP 130/82 Pulse 130-135 Patient's assessment is without other acute changes noted.Patient repositioned and extra blankets applied. MD aware

## 2023-08-07 DIAGNOSIS — J189 Pneumonia, unspecified organism: Secondary | ICD-10-CM | POA: Diagnosis not present

## 2023-08-07 LAB — BASIC METABOLIC PANEL WITH GFR
Anion gap: 9 (ref 5–15)
BUN: 12 mg/dL (ref 8–23)
CO2: 19 mmol/L — ABNORMAL LOW (ref 22–32)
Calcium: 7.9 mg/dL — ABNORMAL LOW (ref 8.9–10.3)
Chloride: 105 mmol/L (ref 98–111)
Creatinine, Ser: 0.65 mg/dL (ref 0.61–1.24)
GFR, Estimated: >60 mL/min (ref >60)
Glucose, Bld: 197 mg/dL — ABNORMAL HIGH (ref 70–99)
Potassium: 3 mmol/L — ABNORMAL LOW (ref 3.5–5.1)
Sodium: 133 mmol/L — ABNORMAL LOW (ref 135–145)

## 2023-08-07 LAB — CBC
HCT: 31.1 % — ABNORMAL LOW (ref 39.0–52.0)
Hemoglobin: 9.2 g/dL — ABNORMAL LOW (ref 13.0–17.0)
MCH: 26 pg (ref 26.0–34.0)
MCHC: 29.6 g/dL — ABNORMAL LOW (ref 30.0–36.0)
MCV: 87.9 fL (ref 80.0–100.0)
Platelets: 254 K/uL (ref 150–400)
RBC: 3.54 MIL/uL — ABNORMAL LOW (ref 4.22–5.81)
RDW: 16.5 % — ABNORMAL HIGH (ref 11.5–15.5)
WBC: 14.1 K/uL — ABNORMAL HIGH (ref 4.0–10.5)
nRBC: 0 % (ref 0.0–0.2)

## 2023-08-07 LAB — GLUCOSE, CAPILLARY
Glucose-Capillary: 87 mg/dL (ref 70–99)
Glucose-Capillary: 97 mg/dL (ref 70–99)
Glucose-Capillary: 221 mg/dL — ABNORMAL HIGH (ref 70–99)
Glucose-Capillary: 250 mg/dL — ABNORMAL HIGH (ref 70–99)
Glucose-Capillary: 147 mg/dL — ABNORMAL HIGH (ref 70–99)

## 2023-08-07 LAB — HEMOGLOBIN A1C
Hgb A1c MFr Bld: 5.6 % (ref 4.8–5.6)
Mean Plasma Glucose: 114 mg/dL

## 2023-08-07 LAB — OCCULT BLOOD X 1 CARD TO LAB, STOOL: Fecal Occult Bld: NEGATIVE

## 2023-08-07 LAB — LEGIONELLA PNEUMOPHILA SEROGP 1 UR AG: L. pneumophila Serogp 1 Ur Ag: NEGATIVE

## 2023-08-07 MED ORDER — SENNOSIDES-DOCUSATE SODIUM 8.6-50 MG PO TABS
1.0000 | ORAL_TABLET | Freq: Two times a day (BID) | ORAL | Status: DC
  Administered 2023-08-07 – 2023-08-10 (×5): 1 via ORAL
  Filled 2023-08-07 (×5): qty 1

## 2023-08-07 MED ORDER — POTASSIUM CHLORIDE CRYS ER 20 MEQ PO TBCR
40.0000 meq | EXTENDED_RELEASE_TABLET | Freq: Two times a day (BID) | ORAL | Status: AC
  Administered 2023-08-07 (×2): 40 meq via ORAL
  Filled 2023-08-07 (×2): qty 2

## 2023-08-07 MED ORDER — INSULIN ASPART 100 UNIT/ML IJ SOLN
0.0000 [IU] | Freq: Three times a day (TID) | INTRAMUSCULAR | Status: DC
  Administered 2023-08-07: 3 [IU] via SUBCUTANEOUS
  Administered 2023-08-07: 1 [IU] via SUBCUTANEOUS
  Administered 2023-08-08: 2 [IU] via SUBCUTANEOUS
  Administered 2023-08-08: 3 [IU] via SUBCUTANEOUS
  Administered 2023-08-09: 1 [IU] via SUBCUTANEOUS
  Administered 2023-08-10: 2 [IU] via SUBCUTANEOUS

## 2023-08-07 MED ORDER — BISACODYL 10 MG RE SUPP
10.0000 mg | Freq: Once | RECTAL | Status: DC
  Filled 2023-08-07: qty 1

## 2023-08-07 NOTE — TOC Initial Note (Signed)
 Transition of Care [facility]) - Initial/Assessment Note    Patient Details  Name: Francisco [name] MRN: [id number] Date of Birth: October 10, 1933  [facility] of [facility]) CM/SW Contact:    Bascom Service, RN Phone Number: 08/07/2023, 4:00 PM  Clinical Narrative: PT recc ST SNF-patient/family in agreement-faxed out await bed offers.                  Expected Discharge Plan: Skilled Nursing Facility Barriers to Discharge: Continued Medical Work up   Patient Goals and CMS Choice Patient states their goals for this hospitalization and ongoing recovery are:: Rehab CMS Medicare.gov Compare Post Acute Care list provided to:: Patient Represenative (must comment) (Jeanette(spouse)) Choice offered to / list presented to : Spouse [place] ownership interest in [facility].provided to:: Spouse    Expected Discharge Plan and Services                                              Prior Living Arrangements/Services                       Activities of Daily Living   ADL Screening (condition at time of admission) Independently performs ADLs?: No Does the patient have a NEW difficulty with bathing/dressing/toileting/self-feeding that is expected to last >3 days?: No Does the patient have a NEW difficulty with getting in/out of bed, walking, or climbing stairs that is expected to last >3 days?: No Does the patient have a NEW difficulty with communication that is expected to last >3 days?: No Is the patient deaf or have difficulty hearing?: Yes Does the patient have difficulty seeing, even when wearing glasses/contacts?: No Does the patient have difficulty concentrating, remembering, or making decisions?: No  Permission Sought/Granted                  Emotional Assessment              Admission diagnosis:  CAP (community acquired pneumonia) [J18.9] Generalized weakness [R53.1] Pneumonia due to infectious organism, unspecified laterality, unspecified part of  lung [J18.9] Patient Active Problem List   Diagnosis Date Noted   CAP (community acquired pneumonia) 08/05/2023   Hypokalemia 08/05/2023   Anemia 08/05/2023   Debility 08/05/2023   Hypoalbuminemia 08/05/2023   BPH with obstruction/lower urinary tract symptoms 06/15/2022   Abrasion of unspecified hand, initial encounter 10/17/2020   ARF (acute renal failure) (HCC) 08/12/2020   Nausea vomiting and diarrhea 08/12/2020   AKI (acute kidney injury) (HCC) 08/12/2020   Sensorineural hearing loss (SNHL) of both ears 03/22/2020   Diabetes mellitus type 2, uncomplicated (HCC) 04/28/2018   Elevated carcinoembryonic antigen (CEA) 04/08/2017   History of urinary stone 04/08/2017   Internal Gr 2 and Gr 1 external bleeding hemorrhoids 05/23/2016   Hx of adenomatous colonic polyps 02/14/2016   Fatty liver 11/02/2015   Disorder of soft tissue 07/13/2015   Gastro-esophageal reflux disease without esophagitis 03/07/2015   Non-thrombocytopenic purpura (HCC) 10/27/2014   Encounter for general adult medical examination without abnormal findings 10/20/2014   Benign prostatic hyperplasia 11/26/2011   Hyperlipidemia 09/28/2011   Renal calculi 09/19/2011   PCP:  Shepard Ade, MD Pharmacy:   [facility] - [place], [place] - [address] [place] [address] Phone: [phone] Fax: [phone]  [facility] - [facility]  Pharmacy [address] [place] [address] Phone: [phone] Fax: [phone]  [id number] - [place], [place] - [address] [place] [address] Phone: [phone] Fax: [phone]     Social Drivers of Health (SDOH) Social History: SDOH Screenings   Food Insecurity: No Food Insecurity (08/05/2023)  Housing: Low Risk  (08/05/2023)  Transportation Needs: No Transportation Needs (08/05/2023)  Utilities: Not At Risk (08/05/2023)  Social Connections: Socially Isolated  (08/05/2023)  Tobacco Use: Medium Risk (08/05/2023)   SDOH Interventions:     Readmission Risk Interventions     No data to display

## 2023-08-07 NOTE — Progress Notes (Signed)
 Sling removed per patient request.

## 2023-08-07 NOTE — Progress Notes (Signed)
 PROGRESS NOTE  Francisco [name]  FMW:[id number] DOB: [birth year] DOA: 08/05/2023 PCP: Shepard Ade, MD   Brief Narrative: Patient is a 89 year old male with history of diabetes type 2, pancreatic mass s/p resection, recent left arm fracture, BPH, hypertension, GERD, Barrett'sesophagus who was who presented with fatigue from home .  As per the report, he has been increasingly more weak, confused as per family.  At baseline, he is alert and oriented.  There was concern for UTI.  On presentation,he was alert and oriented.  Complained of some mild cough.  Had a mild skin tear on the left arm. UA was not suspicious for UTI.  Chest x-ray showed diffuse mild to moderate increased interstitial markings.  COVID/flu/RSV negative.  Suspected to have community-acquired pneumonia and was started antibiotics.  PT/ OT evaluation done, recommended SNF.  Medically stable for discharge whenever possible  Assessment & Plan:  Principal Problem:   CAP (community acquired pneumonia) Active Problems:   Benign prostatic hyperplasia   Diabetes mellitus type 2, uncomplicated (HCC)   Gastro-esophageal reflux disease without esophagitis   Hypokalemia   Anemia   Debility   Hypoalbuminemia  Community-acquired pneumonia: Presented with weakness, cough, confusion.  Chest x-ray showed increased interstitial markings, nonspecific finding.  Afebrile on presentation.  Saturating fine on room air.  Continue ceftriaxone  and azithromycin  for now.  Follow-up sputum culture.  Looks like sample has not been collected yet.  RSV/flu/COVID-negative. Negative strep pneumo urine antigen, pending Legionella antigen.  Does not look in any kind of respiratory distress this morning.  Speaking in full sentences.  Not in any kind of distress  Generalized weakness/confusion: Likely secondary to pneumonia.  PT/OT consulted, recommended SNF.  CT head did not show any acute intracranial abnormalities but showed atrophy and chronic small vessel  ischemic changes of the white matter.  Early dementia not ruled out.  He is mostly oriented  History of BPH: Continue Proscar   Hypomagnesemia: Magnesium  supplemented  Diabetes type 2: Currently on sliding scale.  On Jardiance  at home  GERD: Continue PPI  Hypokalemia:Supplemented and corrected  Iron deficiency normocytic anemia: Hemoglobin currently in the range of 8.  Iron panel showed low iron.  Started on oral iron supplementation.  No report of hematochezia or melena.  Low suspicion for GI bleed.  Will check FOBT  Recent history of left upper extremity fracture: Happened in  June 2025, follows with orthopedics.  Currently in sling.  Also has left knee problem and brace was recommended on ambulation.  PT consulted here  Severe protein calorie malnutrition/low albumin : Albumin  of just 1.8.  Added Glucerna.  Dietitian consulted and following.  Constipation: Continue bowel regimen     Nutrition Problem: Severe Malnutrition Etiology: acute illness    DVT prophylaxis:SCDs Start: 08/05/23 2004     Code Status: Full Code  Family Communication: Discussed with daughter and his spouse at bedside on 8/6  Patient status:Inpatient  Patient is from :Home  Anticipated discharge to: SNF  Estimated DC date:1-2 days   Consultants: None  Procedures:None  Antimicrobials:  Anti-infectives (From admission, onward)    Start     Dose/Rate Route Frequency Ordered Stop   08/06/23 1800  cefTRIAXone  (ROCEPHIN ) 2 g in sodium chloride  0.9 % 100 mL IVPB        2 g 200 mL/hr over 30 Minutes Intravenous Every 24 hours 08/05/23 1938 08/11/23 1759   08/06/23 1800  azithromycin  (ZITHROMAX ) 500 mg in sodium chloride  0.9 % 250 mL IVPB  500 mg 250 mL/hr over 60 Minutes Intravenous Every 24 hours 08/05/23 1938 08/11/23 1759   08/05/23 1645  cefTRIAXone  (ROCEPHIN ) 2 g in sodium chloride  0.9 % 100 mL IVPB        2 g 200 mL/hr over 30 Minutes Intravenous  Once 08/05/23 1641 08/05/23 1937    08/05/23 1645  azithromycin  (ZITHROMAX ) 500 mg in sodium chloride  0.9 % 250 mL IVPB        500 mg 250 mL/hr over 60 Minutes Intravenous  Once 08/05/23 1641 08/06/23 0547       Subjective: Patient seen and examined at bedside today.  Hemodynamically stable.  Comfortable on room air, speaking in full sentences.  Denies any shortness of breath or cough.  Mostly oriented.  Denies any new complaints.  PT has recommended SNF for discharge.  Long discussion held with the family again about the management plan  Objective: Vitals:   08/06/23 1058 08/06/23 1302 08/06/23 1942 08/07/23 0406  BP:  101/65 112/67 (!) 115/51  Pulse: (!) 104 95 71 63  Resp:  16 18 18   Temp:  98 F (36.7 C) 97.8 F (36.6 C) 97.9 F (36.6 C)  TempSrc:  Oral Oral Oral  SpO2:  96% 98% 99%  Weight:      Height:        Intake/Output Summary (Last 24 hours) at 08/07/2023 1121 Last data filed at 08/07/2023 0600 Gross per 24 hour  Intake 1495.76 ml  Output 450 ml  Net 1045.76 ml   Filed Weights   08/05/23 1343 08/05/23 2115  Weight: 60 kg 56.5 kg    Examination:   General exam: Overall comfortable, not in distress, pleasant elderly male, thin built HEENT: PERRL Respiratory system:  no wheezes or crackles  Cardiovascular system: S1 & S2 heard, RRR.  Gastrointestinal system: Abdomen is nondistended, soft and nontender. Central nervous system: Alert and oriented Extremities: No edema, no clubbing ,no cyanosis, sling in the left upper extremity Skin: No rashes, no ulcers,no icterus     Data Reviewed: I have personally reviewed following labs and imaging studies  CBC: Recent Labs  Lab 08/05/23 1621 08/06/23 0457  WBC 12.4* 11.0*  NEUTROABS 10.9*  --   HGB 9.6* 8.6*  HCT 30.8* 27.0*  MCV 85.6 84.4  PLT 244 214   Basic Metabolic Panel: Recent Labs  Lab 08/05/23 1621 08/06/23 0457  NA 133* 134*  K 3.2* 3.5  CL 99 101  CO2 25 21*  GLUCOSE 101* 76  BUN 15 12  CREATININE 0.68 0.63  CALCIUM  8.4*  8.0*  MG 1.8 1.6*  PHOS 2.6 2.9     Recent Results (from the past 240 hours)  SARS Coronavirus 2 by RT PCR (hospital order, performed in [facility] hospital lab) *cepheid single result test* Anterior Nasal Swab     Status: None   Collection Time: 08/05/23  7:32 PM   Specimen: Anterior Nasal Swab  Result Value Ref Range Status   SARS Coronavirus 2 by RT PCR NEGATIVE NEGATIVE Final    Comment: (NOTE) SARS-CoV-2 target nucleic acids are NOT DETECTED.  The SARS-CoV-2 RNA is generally detectable in upper and lower respiratory specimens during the acute phase of infection. The lowest concentration of SARS-CoV-2 viral copies this assay can detect is 250 copies / mL. A negative result does not preclude SARS-CoV-2 infection and should not be used as the sole basis for treatment or other patient management decisions.  A negative result may occur with improper specimen collection /  handling, submission of specimen other than nasopharyngeal swab, presence of viral mutation(s) within the areas targeted by this assay, and inadequate number of viral copies (<250 copies / mL). A negative result must be combined with clinical observations, patient history, and epidemiological information.  Fact Sheet for Patients:   RoadLapTop.co.za  Fact Sheet for Healthcare Providers: http://kim-miller.com/  This test is not yet approved or  cleared by the [place]  FDA and has been authorized for detection and/or diagnosis of SARS-CoV-2 by FDA under an Emergency Use Authorization (EUA).  This EUA will remain in effect (meaning this test can be used) for the duration of the COVID-19 declaration under Section 564(b)(1) of the Act, 21 U.S.C. section 360bbb-3(b)(1), unless the authorization is terminated or revoked sooner.  Performed at [facility], [address], [place], [place] [address]   Culture, blood (x 2)     Status: None  (Preliminary result)   Collection Time: 08/05/23  9:26 PM   Specimen: BLOOD  Result Value Ref Range Status   Specimen Description   Final    BLOOD BLOOD RIGHT ARM Performed at [facility], [address], [place], [place] [address]    Special Requests   Final    BOTTLES DRAWN AEROBIC ONLY Blood Culture results may not be optimal due to an inadequate volume of blood received in culture bottles Performed at [facility], [address], [place], [place] [address]    Culture   Final    NO GROWTH 1 DAY Performed at [facility], [address] [place], [place] [address]    Report Status PENDING  Incomplete  Culture, blood (x 2)     Status: None (Preliminary result)   Collection Time: 08/05/23  9:30 PM   Specimen: BLOOD  Result Value Ref Range Status   Specimen Description   Final    BLOOD BLOOD RIGHT HAND Performed at [facility], [address], [place], [place] [address]    Special Requests   Final    BOTTLES DRAWN AEROBIC ONLY Blood Culture results may not be optimal due to an inadequate volume of blood received in culture bottles Performed at [facility], [address] [place], [place] [address]    Culture   Final    NO GROWTH 1 DAY Performed at [facility], [address] [place], [place] [address]    Report Status PENDING  Incomplete     Radiology Studies: CT Head Wo Contrast Result Date: 08/05/2023 CLINICAL DATA:  Headache EXAM: CT HEAD WITHOUT CONTRAST TECHNIQUE: Contiguous axial images were obtained from the base of the skull through the vertex without intravenous contrast. RADIATION DOSE REDUCTION: This exam was performed according to the departmental dose-optimization program which includes automated exposure control, adjustment of the mA and/or kV according to patient size and/or use of iterative reconstruction technique. COMPARISON:  CT 06/19/2023 FINDINGS: Brain: No acute  territorial infarction, hemorrhage or intracranial mass. Atrophy and moderate chronic small vessel ischemic changes of the white matter. 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 Stable ventricle size. Vascular: No hyperdense vessels. Vertebral and carotid vascular calcification Skull: No fracture Sinuses/Orbits: Right mastoid effusion. Sphenoid sinus wall thickening and moderate mucosal thickening. Mild ethmoid mucosal thickening Other: None IMPRESSION: 1. No CT evidence for acute intracranial abnormality. 2. Atrophy and chronic small vessel ischemic changes of the white matter. 3. Chronic sphenoid sinus disease. Right mastoid effusion. Electronically Signed   By: Luke  Scott M.D.   On: 08/05/2023 18:32   DG Chest Portable 1 View Result Date: 08/05/2023 CLINICAL DATA:  Confusion.  Weakness. EXAM: PORTABLE CHEST 1 VIEW COMPARISON:  08/11/2020. FINDINGS: Low lung volume. There are diffuse mild-to-moderately increased interstitial markings, which is nonspecific. Differential diagnosis includes pulmonary edema, chronic interstitial lung disease, atypical pneumonia, etc. Correlate clinically. Bilateral lung fields are otherwise clear. Bilateral costophrenic angles are clear. Stable cardio-mediastinal silhouette. Unfolding of aortic arch noted. No acute osseous abnormalities. The soft tissues are within normal limits. IMPRESSION: Diffuse mild-to-moderately increased interstitial markings, which is nonspecific. Differential diagnosis includes pulmonary edema, chronic interstitial lung disease, atypical pneumonia, etc. Electronically Signed   By: Ree Molt M.D.   On: 08/05/2023 14:39    Scheduled Meds:  aspirin  EC  81 mg Oral Daily   bisacodyl   10 mg Rectal Once   feeding supplement  237 mL Oral TID BM   ferrous sulfate   325 mg Oral Q breakfast   finasteride   5 mg Oral Daily   guaiFENesin   600 mg Oral BID   insulin  aspart  0-9 Units Subcutaneous Q4H   lipase/protease/amylase  12,000 Units  Oral TID AC   multivitamin with minerals  1 tablet Oral Daily   pantoprazole   40 mg Oral Daily   polyethylene glycol  17 g Oral Daily   senna-docusate  1 tablet Oral BID   thiamine  (VITAMIN B1) injection  100 mg Intravenous Daily   Continuous Infusions:  sodium chloride  75 mL/hr at 08/07/23 0405   azithromycin  500 mg (08/06/23 1756)   cefTRIAXone  (ROCEPHIN )  IV 2 g (08/06/23 1634)     LOS: 1 day   Ivonne Mustache, MD [facility] P8/06/2023, 11:21 AM

## 2023-08-07 NOTE — Progress Notes (Signed)
 Physical Therapy Treatment Patient Details Name: Francisco [name] MRN: [id number] DOB: 07-08-1933 Today's Date: 08/07/2023   History of Present Illness Pt is a 89 yr old male who presented 08/05/23 due to weakness and UTI. CXR showed pulmonary edema,chronic interstitial lung disease, atypical pneumonia.  PMH:  Dm2, pancreatic mass, recent left arm fracture in sling (6/25), BPH, hypertension, GERD and Barrett's esophagus    PT Comments  PT - Cognition Comments: AxO x 1.5 requiring some repeated instructions then Pt's masks his confusion with anger/grumpy/out bursts repeating I don't give a damn. Assisted OOB to amb was difficult. General bed mobility comments: moving legs off bed on his own, some help to lift trunk and pt trying on his own to scoot hips forward though finally needing help esp to complete scooting to EOB.  Don't touch my arm Pt repeats. General transfer comment: raise me up stated Pt.  Required 2 attempts to complete.  Unsteady.  Decreased weight shift on R LE.  Impaired balance with poor self correction to midline.  L UE in sling for support while amb.  Assist needed on Pt's RIGHT side.  Unable to use a walker due to L UE NWBing/sling, used RIGHT HHA then allowed Pt to support self using IV pole.  Quit pushing that thing too far, repeated Pt as Therapist was attempting to clear R foot from hitting IV pole wheels.  Required increased assist/support Max Assist to transition from standing to sitting to ensure a complete pivot and target center of recliner.  Present with posterior LOB with turns and back steps.  HIGH FALL RISK. General Gait Details: using IV pole for support and RIGHT side assist, Pt was able to amb 18 feet but present with unsteady gait.  HIGH FALL RISK with poor self correction to midline. Pt exhibits short, shuffled steps and decreased weight shift to R (patellar Fx 06/2023).  R knee does NOT buckle but also, Pt does not put full weight through R LE.  Decreased stance  time R LE. Recliner following for safety as Pt c/o fatigue.  Prior to admit, Pt was home with Spouse.  LPT has rec Pt will need ST Rehab at SNF to address mobility and functional decline prior to safely returning home.    If plan is discharge home, recommend the following: A lot of help with bathing/dressing/bathroom;Two people to help with walking and/or transfers;Assist for transportation;Help with stairs or ramp for entrance   Can travel by private vehicle     No  Equipment Recommendations  None recommended by PT    Recommendations for Other Services       Precautions / Restrictions Precautions Precautions: Fall Recall of Precautions/Restrictions: Impaired Precaution/Restrictions Comments: Per 07/30/23 office visit:  L patellar Fx WBAT no KI once QUADS are strong enough to support.  L Prox Humerus Fx NWB cont sling at night/off during day, ROM to wrist/elbow and shoulder pendulum/active ABD as tolerated.  Next Ortho f/u end of August Restrictions Weight Bearing Restrictions Per Provider Order: Yes LUE Weight Bearing Per Provider Order: Non weight bearing RLE Weight Bearing Per Provider Order: Weight bearing as tolerated     Mobility  Bed Mobility Overal bed mobility: Needs Assistance Bed Mobility: Supine to Sit     Supine to sit: HOB elevated, Mod assist, Used rails     General bed mobility comments: moving legs off bed on his own, some help to lift trunk and pt trying on his own to scoot hips forward though finally needing help  esp to complete scooting to EOB.  Don't touch my arm Pt repeats.    Transfers Overall transfer level: Needs assistance Equipment used: 1 person hand held assist Transfers: Sit to/from Stand Sit to Stand: From elevated surface, Max assist, Mod assist           General transfer comment: raise me up stated Pt.  Required 2 attempts to complete.  Unsteady.  Decreased weight shift on R LE.  Impaired balance with poor self correction to  midline.  L UE in sling for support while amb.  Assist needed on Pt's RIGHT side.  Unable to use a walker due to L UE NWBing/sling, used RIGHT HHA then allowed Pt to support self using IV pole.  Quit pushing that thing too far, repeated Pt as Therapist was attempting to clear R foot from hitting IV pole wheels.  Required increased assist/support Max Assist to transition from standing to sitting to ensure a complete pivot and target center of recliner.  Present with posterior LOB with turns and back steps.  HIGH FALL RISK.    Ambulation/Gait Ambulation/Gait assistance: Mod assist, Max assist Gait Distance (Feet): 18 Feet Assistive device: 1 person hand held assist, IV Pole Gait Pattern/deviations: Step-to pattern, Decreased stride length, Trunk flexed, Wide base of support, Shuffle, Decreased stance time - right Gait velocity: decreased     General Gait Details: using IV pole for support and RIGHT side assist, Pt was able to amb 18 feet but present with unsteady gait.  HIGH FALL RISK with poor self correction to midline. Pt exhibits short, shuffled steps and decreased weight shift to R (patellar Fx 06/2023).  R knee does NOT buckle but also, Pt does not put full weight through R LE.  Decreased stance time R LE. Recliner following for safety as Pt c/o fatigue.   Stairs             Wheelchair Mobility     Tilt Bed    Modified Rankin (Stroke Patients Only)       Balance                                            Communication Communication Communication: Impaired Factors Affecting Communication: Hearing impaired  Cognition Arousal: Alert Behavior During Therapy:  (honary)   PT - Cognitive impairments: Safety/Judgement, Memory, Awareness, Difficult to assess                       PT - Cognition Comments: AxO x 1.5 requiring some repeated instructions then Pt's masks his confusion with anger/grumpy/out bursts repeating I don't give a  damn. Following commands: Impaired Following commands impaired: Only follows one step commands consistently, Follows one step commands with increased time    Cueing Cueing Techniques: Verbal cues  Exercises      General Comments        Pertinent Vitals/Pain Pain Assessment Pain Assessment: Faces Faces Pain Scale: Hurts little more Pain Location: R upper  arm when touched Pain Descriptors / Indicators: Discomfort, Sore, Grimacing Pain Intervention(s): Monitored during session, Repositioned    Home Living                          Prior Function            PT Goals (current goals can now be found in  the care plan section) Progress towards PT goals: Progressing toward goals    Frequency    Min 2X/week      PT Plan      Co-evaluation              AM-PAC PT 6 Clicks Mobility   Outcome Measure  Help needed turning from your back to your side while in a flat bed without using bedrails?: A Lot Help needed moving from lying on your back to sitting on the side of a flat bed without using bedrails?: A Lot Help needed moving to and from a bed to a chair (including a wheelchair)?: A Lot Help needed standing up from a chair using your arms (e.g., wheelchair or bedside chair)?: A Lot Help needed to walk in hospital room?: A Lot Help needed climbing 3-5 steps with a railing? : Total 6 Click Score: 11    End of Session Equipment Utilized During Treatment: Gait belt Activity Tolerance: Patient limited by fatigue Patient left: in chair;with chair alarm set;with family/visitor present Nurse Communication: Mobility status PT Visit Diagnosis: Other abnormalities of gait and mobility (R26.89);Muscle weakness (generalized) (M62.81)     Time: 8469-8443 PT Time Calculation (min) (ACUTE ONLY): 26 min  Charges:    $Gait Training: 8-22 mins $Therapeutic Activity: 8-22 mins PT General Charges $$ ACUTE PT VISIT: 1 Visit                     {Laquanta Hummel   PTA Acute  Rehabilitation Services Office M-F          [phone]

## 2023-08-07 NOTE — NC FL2 (Signed)
 [place]  MEDICAID FL2 LEVEL OF CARE FORM     IDENTIFICATION  Patient Name: Francisco [name] Birthdate: 1933/07/20 Sex: male Admission Date (Current Location): 08/05/2023  [facility] and [place] Number:  Producer, television/film/video and Address:  [facility],  [address] [place] [address]      Provider Number: [id number]  Attending Physician Name and Address:  Jillian Buttery, MD  Relative Name and Phone Number:  Dhanush Jokerst) [phone]    Current Level of Care: Hospital Recommended Level of Care: Skilled Nursing Facility Prior Approval Number:    Date Approved/Denied:   PASRR Number: [id number]  Discharge Plan: SNF    Current Diagnoses: Patient Active Problem List   Diagnosis Date Noted   CAP (community acquired pneumonia) 08/05/2023   Hypokalemia 08/05/2023   Anemia 08/05/2023   Debility 08/05/2023   Hypoalbuminemia 08/05/2023   BPH with obstruction/lower urinary tract symptoms 06/15/2022   Abrasion of unspecified hand, initial encounter 10/17/2020   ARF (acute renal failure) (HCC) 08/12/2020   Nausea vomiting and diarrhea 08/12/2020   AKI (acute kidney injury) (HCC) 08/12/2020   Sensorineural hearing loss (SNHL) of both ears 03/22/2020   Diabetes mellitus type 2, uncomplicated (HCC) 04/28/2018   Elevated carcinoembryonic antigen (CEA) 04/08/2017   History of urinary stone 04/08/2017   Internal Gr 2 and Gr 1 external bleeding hemorrhoids 05/23/2016   Hx of adenomatous colonic polyps 02/14/2016   Fatty liver 11/02/2015   Disorder of soft tissue 07/13/2015   Gastro-esophageal reflux disease without esophagitis 03/07/2015   Non-thrombocytopenic purpura (HCC) 10/27/2014   Encounter for general adult medical examination without abnormal findings 10/20/2014   Benign prostatic hyperplasia 11/26/2011   Hyperlipidemia 09/28/2011   Renal calculi 09/19/2011    Orientation RESPIRATION BLADDER Height & Weight     Self, Time, Situation, Place   Normal Continent Weight: 56.5 kg Height:  5' 8 (172.7 cm)  BEHAVIORAL SYMPTOMS/MOOD NEUROLOGICAL BOWEL NUTRITION STATUS      Continent Diet (Regular)  AMBULATORY STATUS COMMUNICATION OF NEEDS Skin   Limited Assist Verbally Other (Comment) (R buttock pressure injury-see d/c summary)                       Personal Care Assistance Level of Assistance  Bathing, Feeding, Dressing Bathing Assistance: Limited assistance   Dressing Assistance: Limited assistance     Functional Limitations Info  Sight, Hearing, Speech   Hearing Info: Adequate Speech Info: Adequate    SPECIAL CARE FACTORS FREQUENCY  PT (By licensed PT), OT (By licensed OT)     PT Frequency: 5x week OT Frequency: 5x week            Contractures Contractures Info: Not present    Additional Factors Info  Code Status, Allergies, Psychotropic, Insulin  Sliding Scale Code Status Info: Full Allergies Info: Metformin   Insulin  Sliding Scale Info: SSI       Current Medications (08/07/2023):  This is the current hospital active medication list Current Facility-Administered Medications  Medication Dose Route Frequency Provider Last Rate Last Admin   0.9 %  sodium chloride  infusion   Intravenous Continuous Jillian Buttery, MD 75 mL/hr at 08/07/23 0405 New Bag at 08/07/23 0405   acetaminophen  (TYLENOL ) tablet 650 mg  650 mg Oral Q6H PRN Doutova, Anastassia, MD       Or   acetaminophen  (TYLENOL ) suppository 650 mg  650 mg Rectal Q6H PRN Doutova, Anastassia, MD       albuterol  (PROVENTIL ) (2.5 MG/3ML)  0.083% nebulizer solution 2.5 mg  2.5 mg Nebulization Q2H PRN Doutova, Anastassia, MD       aspirin  EC tablet 81 mg  81 mg Oral Daily Doutova, Anastassia, MD   81 mg at 08/07/23 1112   azithromycin  (ZITHROMAX ) 500 mg in sodium chloride  0.9 % 250 mL IVPB  500 mg Intravenous Q24H Doutova, Anastassia, MD 250 mL/hr at 08/06/23 1756 500 mg at 08/06/23 1756   bisacodyl  (DULCOLAX) suppository 10 mg  10 mg Rectal Once Adhikari,  Amrit, MD       cefTRIAXone  (ROCEPHIN ) 2 g in sodium chloride  0.9 % 100 mL IVPB  2 g Intravenous Q24H Doutova, Anastassia, MD 200 mL/hr at 08/06/23 1634 2 g at 08/06/23 1634   feeding supplement (ENSURE PLUS HIGH PROTEIN) liquid 237 mL  237 mL Oral TID BM Jillian Buttery, MD   237 mL at 08/07/23 1435   ferrous sulfate  tablet 325 mg  325 mg Oral Q breakfast Jillian Buttery, MD   325 mg at 08/07/23 0804   finasteride  (PROSCAR ) tablet 5 mg  5 mg Oral Daily Doutova, Anastassia, MD   5 mg at 08/07/23 1113   guaiFENesin  (MUCINEX ) 12 hr tablet 600 mg  600 mg Oral BID Doutova, Anastassia, MD   600 mg at 08/07/23 1113   HYDROcodone -acetaminophen  (NORCO/VICODIN) 5-325 MG per tablet 1-2 tablet  1-2 tablet Oral Q4H PRN Doutova, Anastassia, MD       insulin  aspart (novoLOG ) injection 0-9 Units  0-9 Units Subcutaneous Q4H Doutova, Anastassia, MD   3 Units at 08/07/23 1206   lipase/protease/amylase (CREON ) capsule 12,000 Units  12,000 Units Oral TID AC Doutova, Anastassia, MD   12,000 Units at 08/07/23 1259   multivitamin with minerals tablet 1 tablet  1 tablet Oral Daily Jillian Buttery, MD   1 tablet at 08/07/23 1112   ondansetron  (ZOFRAN ) tablet 4 mg  4 mg Oral Q6H PRN Doutova, Anastassia, MD       Or   ondansetron  (ZOFRAN ) injection 4 mg  4 mg Intravenous Q6H PRN Doutova, Anastassia, MD       pantoprazole  (PROTONIX ) EC tablet 40 mg  40 mg Oral Daily Doutova, Anastassia, MD   40 mg at 08/07/23 1113   polyethylene glycol (MIRALAX  / GLYCOLAX ) packet 17 g  17 g Oral Daily Adhikari, Amrit, MD   17 g at 08/07/23 1114   potassium chloride  SA (KLOR-CON  M) CR tablet 40 mEq  40 mEq Oral BID Jillian Buttery, MD   40 mEq at 08/07/23 1430   senna-docusate (Senokot-S) tablet 1 tablet  1 tablet Oral BID Jillian Buttery, MD   1 tablet at 08/07/23 1113   thiamine  (VITAMIN B1) injection 100 mg  100 mg Intravenous Daily Doutova, Anastassia, MD   100 mg at 08/07/23 1114     Discharge Medications: Please see discharge summary  for a list of discharge medications.  Relevant Imaging Results:  Relevant Lab Results:   Additional Information SS[address][id number] [place], [place]

## 2023-08-08 DIAGNOSIS — J189 Pneumonia, unspecified organism: Secondary | ICD-10-CM | POA: Diagnosis not present

## 2023-08-08 LAB — GLUCOSE, CAPILLARY
Glucose-Capillary: 84 mg/dL (ref 70–99)
Glucose-Capillary: 107 mg/dL — ABNORMAL HIGH (ref 70–99)
Glucose-Capillary: 212 mg/dL — ABNORMAL HIGH (ref 70–99)
Glucose-Capillary: 165 mg/dL — ABNORMAL HIGH (ref 70–99)

## 2023-08-08 MED ORDER — AZITHROMYCIN 500 MG PO TABS
500.0000 mg | ORAL_TABLET | Freq: Every day | ORAL | Status: DC
  Administered 2023-08-08: 500 mg via ORAL
  Filled 2023-08-08: qty 1

## 2023-08-08 MED ORDER — THIAMINE MONONITRATE 100 MG PO TABS
100.0000 mg | ORAL_TABLET | Freq: Every day | ORAL | Status: DC
  Administered 2023-08-09 – 2023-08-10 (×2): 100 mg via ORAL
  Filled 2023-08-08 (×2): qty 1

## 2023-08-08 NOTE — Progress Notes (Signed)
 Orthopedic Tech Progress Note Patient Details:  Francisco [name] July 10, 1933 [id number]  Ortho Devices Type of Ortho Device: Arm sling Ortho Device/Splint Location: replaced left arm sling Ortho Device/Splint Interventions: Ordered, Application, Adjustment   Post Interventions Patient Tolerated: Well Instructions Provided: Adjustment of device, Care of device  Waylan Thom Loving 08/08/2023, 2:37 PM

## 2023-08-08 NOTE — TOC Progression Note (Signed)
 Transition of Care [facility]) - Progression Note    Patient Details  Name: Francisco [name] MRN: [id number] Date of Birth: 25-May-1933  [facility] of [facility] CM/SW Contact  Aaira Oestreicher, [place], RN Phone Number: 08/08/2023, 2:49 PM  Clinical Narrative: Lafrances Loose chose [facility] re Tacey aware-TC HTA rep Tammy aware to inititate auth for [facility], & [facility]-await auth.      Expected Discharge Plan: Skilled Nursing Facility Barriers to Discharge: Insurance Authorization               Expected Discharge Plan and Services                                               Social Drivers of Health (SDOH) Interventions SDOH Screenings   Food Insecurity: No Food Insecurity (08/05/2023)  Housing: Low Risk  (08/05/2023)  Transportation Needs: No Transportation Needs (08/05/2023)  Utilities: Not At Risk (08/05/2023)  Social Connections: Socially Isolated (08/05/2023)  Tobacco Use: Medium Risk (08/05/2023)    Readmission Risk Interventions     No data to display

## 2023-08-08 NOTE — Plan of Care (Signed)
  Problem: Education: Goal: Ability to describe self-care measures that may prevent or decrease complications (Diabetes Survival Skills Education) will improve Outcome: Progressing Goal: Individualized Educational Video(s) Outcome: Progressing   Problem: Coping: Goal: Ability to adjust to condition or change in health will improve Outcome: Progressing   Problem: Fluid Volume: Goal: Ability to maintain a balanced intake and output will improve Outcome: Progressing   Problem: Health Behavior/Discharge Planning: Goal: Ability to identify and utilize available resources and services will improve Outcome: Progressing Goal: Ability to manage health-related needs will improve Outcome: Progressing   Problem: Metabolic: Goal: Ability to maintain appropriate glucose levels will improve Outcome: Progressing   Problem: Nutritional: Goal: Maintenance of adequate nutrition will improve Outcome: Progressing Goal: Progress toward achieving an optimal weight will improve Outcome: Progressing   Problem: Skin Integrity: Goal: Risk for impaired skin integrity will decrease Outcome: Progressing   Problem: Tissue Perfusion: Goal: Adequacy of tissue perfusion will improve Outcome: Progressing   Problem: Fluid Volume: Goal: Hemodynamic stability will improve Outcome: Progressing   Problem: Clinical Measurements: Goal: Diagnostic test results will improve Outcome: Progressing Goal: Signs and symptoms of infection will decrease Outcome: Progressing   Problem: Respiratory: Goal: Ability to maintain adequate ventilation will improve Outcome: Progressing   Problem: Education: Goal: Knowledge of General Education information will improve Description: Including pain rating scale, medication(s)/side effects and non-pharmacologic comfort measures Outcome: Progressing   Problem: Health Behavior/Discharge Planning: Goal: Ability to manage health-related needs will improve Outcome:  Progressing   Problem: Clinical Measurements: Goal: Ability to maintain clinical measurements within normal limits will improve Outcome: Progressing Goal: Will remain free from infection Outcome: Progressing Goal: Diagnostic test results will improve Outcome: Progressing Goal: Respiratory complications will improve Outcome: Progressing Goal: Cardiovascular complication will be avoided Outcome: Progressing   Problem: Activity: Goal: Risk for activity intolerance will decrease Outcome: Progressing   Problem: Nutrition: Goal: Adequate nutrition will be maintained Outcome: Progressing   Problem: Coping: Goal: Level of anxiety will decrease Outcome: Progressing   Problem: Elimination: Goal: Will not experience complications related to bowel motility Outcome: Progressing Goal: Will not experience complications related to urinary retention Outcome: Progressing   Problem: Pain Managment: Goal: General experience of comfort will improve and/or be controlled Outcome: Progressing   Problem: Safety: Goal: Ability to remain free from injury will improve Outcome: Progressing   Problem: Skin Integrity: Goal: Risk for impaired skin integrity will decrease Outcome: Progressing   Problem: Activity: Goal: Ability to tolerate increased activity will improve Outcome: Progressing   Problem: Clinical Measurements: Goal: Ability to maintain a body temperature in the normal range will improve Outcome: Progressing   Problem: Respiratory: Goal: Ability to maintain adequate ventilation will improve Outcome: Progressing Goal: Ability to maintain a clear airway will improve Outcome: Progressing

## 2023-08-08 NOTE — Progress Notes (Signed)
 PROGRESS NOTE  Francisco [name]  FMW:[id number] DOB: 1933/10/30 DOA: 08/05/2023 PCP: Shepard Ade, MD   Brief Narrative: Patient is a 89 year old male with history of diabetes type 2, pancreatic mass s/p resection, recent left arm fracture, BPH, hypertension, GERD, Barrett'sesophagus who was who presented with fatigue from home .  As per the report, he has been increasingly more weak, confused as per family.  At baseline, he is alert and oriented.  There was concern for UTI.  On presentation,he was alert and oriented.  Complained of some mild cough.  Had a mild skin tear on the left arm. UA was not suspicious for UTI.  Chest x-ray showed diffuse mild to moderate increased interstitial markings.  COVID/flu/RSV negative.  Suspected to have community-acquired pneumonia and was started antibiotics.  PT/ OT evaluation done, recommended SNF.  Medically stable for discharge whenever possible.  Will change antibiotics to oral on discharge  Assessment & Plan:  Principal Problem:   CAP (community acquired pneumonia) Active Problems:   Benign prostatic hyperplasia   Diabetes mellitus type 2, uncomplicated (HCC)   Gastro-esophageal reflux disease without esophagitis   Hypokalemia   Anemia   Debility   Hypoalbuminemia  Community-acquired pneumonia: Presented with weakness, cough, confusion.  Chest x-ray showed increased interstitial markings, nonspecific finding.  Afebrile on presentation.  Saturating fine on room air.  Continue ceftriaxone  and azithromycin  for now.  Follow-up sputum culture.  Looks like sample has not been collected yet.  RSV/flu/COVID-negative. Negative strep pneumo urine antigen,Legionella antigen.  Does not look in any kind of respiratory distress this morning.  Speaking in full sentences.    Generalized weakness/confusion: Likely secondary to pneumonia.  PT/OT consulted, recommended SNF.  CT head did not show any acute intracranial abnormalities but showed atrophy and chronic small  vessel ischemic changes of the white matter.  Early dementia not ruled out.  He is mostly oriented  History of BPH: Continue Proscar   Hypomagnesemia/hypokalemia: Magnesium  supplemented   Diabetes type 2: Currently on sliding scale.  On Jardiance  at home  GERD: Continue PPI  Iron deficiency normocytic anemia: Hemoglobin currently in the range of 8.  Iron panel showed low iron.  Started on oral iron supplementation.  No report of hematochezia or melena.  Low suspicion for GI bleed.  Negative FOBT  Recent history of left upper extremity fracture: Happened in  June 2025, follows with orthopedics.  Currently in sling.  Also has left knee problem and brace was recommended on ambulation.  PT consulted here  Severe protein calorie malnutrition/low albumin : Albumin  of just 1.8.  Added Glucerna.  Dietitian consulted and following.  Constipation: Continue bowel regimen     Nutrition Problem: Severe Malnutrition Etiology: acute illness    DVT prophylaxis:SCDs Start: 08/05/23 2004     Code Status: Full Code  Family Communication: Discussed with  spouse at bedside on 8/7  Patient status:Inpatient  Patient is from :Home  Anticipated discharge to: SNF  Estimated DC date:whenever possible   Consultants: None  Procedures:None  Antimicrobials:  Anti-infectives (From admission, onward)    Start     Dose/Rate Route Frequency Ordered Stop   08/08/23 1800  azithromycin  (ZITHROMAX ) tablet 500 mg        500 mg Oral Daily-1800 08/08/23 1043 08/11/23 1759   08/06/23 1800  cefTRIAXone  (ROCEPHIN ) 2 g in sodium chloride  0.9 % 100 mL IVPB        2 g 200 mL/hr over 30 Minutes Intravenous Every 24 hours 08/05/23 1938 08/11/23 1759   08/06/23  1800  azithromycin  (ZITHROMAX ) 500 mg in sodium chloride  0.9 % 250 mL IVPB  Status:  Discontinued        500 mg 250 mL/hr over 60 Minutes Intravenous Every 24 hours 08/05/23 1938 08/08/23 1043   08/05/23 1645  cefTRIAXone  (ROCEPHIN ) 2 g in sodium chloride   0.9 % 100 mL IVPB        2 g 200 mL/hr over 30 Minutes Intravenous  Once 08/05/23 1641 08/05/23 1937   08/05/23 1645  azithromycin  (ZITHROMAX ) 500 mg in sodium chloride  0.9 % 250 mL IVPB        500 mg 250 mL/hr over 60 Minutes Intravenous  Once 08/05/23 1641 08/06/23 0547       Subjective: Patient seen and examined at bedside today.  Very comfortable.  Lying on bed.  Alert and oriented this morning.  Not in any kind of distress.  On room air.  No cough or shortness of breath.  Had good bowel movements yesterday  Objective: Vitals:   08/07/23 0406 08/07/23 1323 08/07/23 2050 08/08/23 0428  BP: (!) 115/51 (!) 102/58 117/71 126/63  Pulse: 63 73 70 74  Resp: 18 20 16 18   Temp: 97.9 F (36.6 C) 97.8 F (36.6 C) 98 F (36.7 C) (!) 97.3 F (36.3 C)  TempSrc: Oral Oral Oral Oral  SpO2: 99% 99% 98% 100%  Weight:      Height:        Intake/Output Summary (Last 24 hours) at 08/08/2023 1106 Last data filed at 08/08/2023 0600 Gross per 24 hour  Intake 1831.95 ml  Output 750 ml  Net 1081.95 ml   Filed Weights   08/05/23 1343 08/05/23 2115  Weight: 60 kg 56.5 kg    Examination:   General exam: Overall comfortable, not in distress, pleasant elderly male, thin build HEENT: PERRL Respiratory system:  no wheezes or crackles  Cardiovascular system: S1 & S2 heard, RRR.  Gastrointestinal system: Abdomen is nondistended, soft and nontender. Central nervous system: Alert and oriented Extremities: No edema, no clubbing ,no cyanosis, swelling of the left upper extremity Skin: No rashes, no ulcers,no icterus       Data Reviewed: I have personally reviewed following labs and imaging studies  CBC: Recent Labs  Lab 08/05/23 1621 08/06/23 0457 08/07/23 1022  WBC 12.4* 11.0* 14.1*  NEUTROABS 10.9*  --   --   HGB 9.6* 8.6* 9.2*  HCT 30.8* 27.0* 31.1*  MCV 85.6 84.4 87.9  PLT 244 214 254   Basic Metabolic Panel: Recent Labs  Lab 08/05/23 1621 08/06/23 0457 08/07/23 1022  NA 133*  134* 133*  K 3.2* 3.5 3.0*  CL 99 101 105  CO2 25 21* 19*  GLUCOSE 101* 76 197*  BUN 15 12 12   CREATININE 0.68 0.63 0.65  CALCIUM  8.4* 8.0* 7.9*  MG 1.8 1.6*  --   PHOS 2.6 2.9  --      Recent Results (from the past 240 hours)  SARS Coronavirus 2 by RT PCR (hospital order, performed in [facility] hospital lab) *cepheid single result test* Anterior Nasal Swab     Status: None   Collection Time: 08/05/23  7:32 PM   Specimen: Anterior Nasal Swab  Result Value Ref Range Status   SARS Coronavirus 2 by RT PCR NEGATIVE NEGATIVE Final    Comment: (NOTE) SARS-CoV-2 target nucleic acids are NOT DETECTED.  The SARS-CoV-2 RNA is generally detectable in upper and lower respiratory specimens during the acute phase of infection. The lowest concentration of  SARS-CoV-2 viral copies this assay can detect is 250 copies / mL. A negative result does not preclude SARS-CoV-2 infection and should not be used as the sole basis for treatment or other patient management decisions.  A negative result may occur with improper specimen collection / handling, submission of specimen other than nasopharyngeal swab, presence of viral mutation(s) within the areas targeted by this assay, and inadequate number of viral copies (<250 copies / mL). A negative result must be combined with clinical observations, patient history, and epidemiological information.  Fact Sheet for Patients:   RoadLapTop.co.za  Fact Sheet for Healthcare Providers: http://kim-miller.com/  This test is not yet approved or  cleared by the [place]  FDA and has been authorized for detection and/or diagnosis of SARS-CoV-2 by FDA under an Emergency Use Authorization (EUA).  This EUA will remain in effect (meaning this test can be used) for the duration of the COVID-19 declaration under Section 564(b)(1) of the Act, 21 U.S.C. section 360bbb-3(b)(1), unless the authorization is terminated  or revoked sooner.  Performed at [facility], [address], [place], [place] [address]   Culture, blood (x 2)     Status: None (Preliminary result)   Collection Time: 08/05/23  9:26 PM   Specimen: BLOOD  Result Value Ref Range Status   Specimen Description   Final    BLOOD BLOOD RIGHT ARM Performed at [facility], [address], [place], [place] [address]    Special Requests   Final    BOTTLES DRAWN AEROBIC ONLY Blood Culture results may not be optimal due to an inadequate volume of blood received in culture bottles Performed at [facility], [address], [place], [place] [address]    Culture   Final    NO GROWTH 2 DAYS Performed at [facility], [address] [place], [place] [address]    Report Status PENDING  Incomplete  Culture, blood (x 2)     Status: None (Preliminary result)   Collection Time: 08/05/23  9:30 PM   Specimen: BLOOD  Result Value Ref Range Status   Specimen Description   Final    BLOOD BLOOD RIGHT HAND Performed at [facility], [address] [place], [place] [address]    Special Requests   Final    BOTTLES DRAWN AEROBIC ONLY Blood Culture results may not be optimal due to an inadequate volume of blood received in culture bottles Performed at [facility], [address], [place], [place] [address]    Culture   Final    NO GROWTH 2 DAYS Performed at [facility], [address], [place], [place] [address]    Report Status PENDING  Incomplete     Radiology Studies: No results found.   Scheduled Meds:  aspirin  EC  81 mg Oral Daily   azithromycin   500 mg Oral q1800   bisacodyl   10 mg Rectal Once   feeding supplement  237 mL Oral TID BM   ferrous sulfate   325 mg Oral Q breakfast   finasteride   5 mg Oral Daily   guaiFENesin   600 mg Oral BID   insulin  aspart  0-9 Units Subcutaneous TID AC & HS   lipase/protease/amylase  12,000  Units Oral TID AC   multivitamin with minerals  1 tablet Oral Daily   pantoprazole   40 mg Oral Daily   polyethylene glycol  17 g Oral Daily   senna-docusate  1 tablet Oral BID   [  START ON 08/09/2023] thiamine   100 mg Oral Daily   Continuous Infusions:  cefTRIAXone  (ROCEPHIN )  IV 2 g (08/07/23 1815)     LOS: 2 days   Ivonne Mustache, MD [facility] P8/07/2023, 11:06 AM

## 2023-08-09 ENCOUNTER — Inpatient Hospital Stay (HOSPITAL_COMMUNITY)

## 2023-08-09 DIAGNOSIS — J189 Pneumonia, unspecified organism: Secondary | ICD-10-CM | POA: Diagnosis not present

## 2023-08-09 DIAGNOSIS — E43 Unspecified severe protein-calorie malnutrition: Secondary | ICD-10-CM | POA: Insufficient documentation

## 2023-08-09 LAB — GLUCOSE, CAPILLARY
Glucose-Capillary: 81 mg/dL (ref 70–99)
Glucose-Capillary: 119 mg/dL — ABNORMAL HIGH (ref 70–99)
Glucose-Capillary: 108 mg/dL — ABNORMAL HIGH (ref 70–99)
Glucose-Capillary: 126 mg/dL — ABNORMAL HIGH (ref 70–99)
Glucose-Capillary: 121 mg/dL — ABNORMAL HIGH (ref 70–99)

## 2023-08-09 MED ORDER — HYDROCODONE-ACETAMINOPHEN 5-325 MG PO TABS: 1.0000 | ORAL_TABLET | ORAL | 0 refills | Status: DC | PRN

## 2023-08-09 MED ORDER — LEVOFLOXACIN 500 MG PO TABS
750.0000 mg | ORAL_TABLET | Freq: Every day | ORAL | Status: DC
  Administered 2023-08-09: 750 mg via ORAL
  Filled 2023-08-09: qty 2

## 2023-08-09 NOTE — TOC Progression Note (Addendum)
 Transition of Care [facility]) - Progression Note    Patient Details  Name: Francisco [name] MRN: [id number] Date of Birth: 09/06/1933  Transition of Care [facility]) CM/SW Contact  Rad Gramling, Nathanel, RN Phone Number: 08/09/2023, 9:12 AM  Clinical Narrative:TC HTA spoke to Francisco [name]-stated patient to be sent for review for [facility], & [facility]-awaiting auth.   -9:30a  Facility can accept over weekend if auth received. -3:55p P2P offered since not approved with r fracture,/L humeral fx, cognition issues call Dr. Janit by 12n tomorrow@ [phone], give patient name-Francisco [name], Francisco [name] 01/08/1933, mbr#[phone]. PTAR was approved autH#[id number]. Informed HTA of IP CM on tomorrow to contact.MD updated.TC spouse Francisco [name] aware of P2P recommendation. -4:24p-spoke to spouse Francisco [name] about understanding patient may or may not do well @ facility & to think about alternatives for higher level of care-Francisco [name] & family understands. Dr Janit will approve [facility] @ [facility]-MD updated & to wait for official auth #'s.    Expected Discharge Plan: Skilled Nursing Facility Barriers to Discharge: Insurance Authorization               Expected Discharge Plan and Services                                               Social Drivers of Health (SDOH) Interventions SDOH Screenings   Food Insecurity: No Food Insecurity (08/05/2023)  Housing: Low Risk  (08/05/2023)  Transportation Needs: No Transportation Needs (08/05/2023)  Utilities: Not At Risk (08/05/2023)  Social Connections: Socially Isolated (08/05/2023)  Tobacco Use: Medium Risk (08/05/2023)    Readmission Risk Interventions     No data to display

## 2023-08-09 NOTE — Progress Notes (Signed)
 PROGRESS NOTE  Francisco [name]  FMW:[id number] DOB: 03/07/1933 DOA: 08/05/2023 PCP: Shepard Ade, MD   Brief Narrative: Patient is a 89 year old male with history of diabetes type 2, pancreatic mass s/p resection, recent left arm fracture, BPH, hypertension, GERD, Barrett'sesophagus who was who presented with fatigue from home .  As per the report, he has been increasingly more weak, confused as per family.  At baseline, he is alert and oriented.  There was concern for UTI.  On presentation,he was alert and oriented.  Complained of some mild cough.  Had a mild skin tear on the left arm. UA was not suspicious for UTI.  Chest x-ray showed diffuse mild to moderate increased interstitial markings.  COVID/flu/RSV negative.  Suspected to have community-acquired pneumonia and was started antibiotics.  PT/ OT evaluation done, recommended SNF.  Medically stable for discharge whenever possible.    Assessment & Plan:  Principal Problem:   CAP (community acquired pneumonia) Active Problems:   Benign prostatic hyperplasia   Diabetes mellitus type 2, uncomplicated (HCC)   Gastro-esophageal reflux disease without esophagitis   Hypokalemia   Anemia   Debility   Hypoalbuminemia  Community-acquired pneumonia: Presented with weakness, cough, confusion.  Chest x-ray showed increased interstitial markings, nonspecific finding.  Afebrile on presentation.  Saturating fine on room air.  Continue ceftriaxone  and azithromycin  for now.  Follow-up sputum culture.  Looks like sample has not been collected yet.  RSV/flu/COVID-negative. Negative strep pneumo urine antigen,Legionella antigen.  Does not look in any kind of respiratory distress this morning.  Speaking in full sentences.    Generalized weakness/confusion: Likely secondary to pneumonia.  PT/OT consulted, recommended SNF.  CT head did not show any acute intracranial abnormalities but showed atrophy and chronic small vessel ischemic changes of the white matter.   Early dementia not ruled out.  He is mostly oriented  History of BPH: Continue Proscar   Hypomagnesemia/hypokalemia: Magnesium /K supplemented and being monitored  Diabetes type 2: Currently on sliding scale.  On Jardiance  at home  GERD: Continue PPI  Iron deficiency normocytic anemia: Hemoglobin currently in the range of 8.  Iron panel showed low iron.  Started on oral iron supplementation.  No report of hematochezia or melena.  Low suspicion for GI bleed.  Negative FOBT  Recent history of left upper extremity fracture/right patellar fracture: Happened in  June 2025, follows with orthopedics.  Currently in sling.  PT consulted here.  He has appointment with orthopedics as an outpatient.  Will get an x-ray of the left humerus and right knee for follow-up, shows stable changes  Severe protein calorie malnutrition/low albumin : Albumin  of just 1.8.  Added Glucerna.  Dietitian consulted and following.  Constipation: Continue bowel regimen     Nutrition Problem: Severe Malnutrition Etiology: acute illness    DVT prophylaxis:SCDs Start: 08/05/23 2004     Code Status: Limited: Do not attempt resuscitation (DNR) -DNR-LIMITED -Do Not Intubate/DNI   Family Communication: Discussed with  spouse at bedside on 8/7  Patient status:Inpatient  Patient is from :Home  Anticipated discharge to: SNF  Estimated DC date:whenever possible   Consultants: None  Procedures:None  Antimicrobials:  Anti-infectives (From admission, onward)    Start     Dose/Rate Route Frequency Ordered Stop   08/09/23 1800  levofloxacin  (LEVAQUIN ) tablet 750 mg        750 mg Oral Daily-1800 08/09/23 1008 08/12/23 1759   08/08/23 1800  azithromycin  (ZITHROMAX ) tablet 500 mg  Status:  Discontinued  500 mg Oral Daily-1800 08/08/23 1043 08/09/23 1008   08/06/23 1800  cefTRIAXone  (ROCEPHIN ) 2 g in sodium chloride  0.9 % 100 mL IVPB  Status:  Discontinued        2 g 200 mL/hr over 30 Minutes Intravenous Every  24 hours 08/05/23 1938 08/09/23 1008   08/06/23 1800  azithromycin  (ZITHROMAX ) 500 mg in sodium chloride  0.9 % 250 mL IVPB  Status:  Discontinued        500 mg 250 mL/hr over 60 Minutes Intravenous Every 24 hours 08/05/23 1938 08/08/23 1043   08/05/23 1645  cefTRIAXone  (ROCEPHIN ) 2 g in sodium chloride  0.9 % 100 mL IVPB        2 g 200 mL/hr over 30 Minutes Intravenous  Once 08/05/23 1641 08/05/23 1937   08/05/23 1645  azithromycin  (ZITHROMAX ) 500 mg in sodium chloride  0.9 % 250 mL IVPB        500 mg 250 mL/hr over 60 Minutes Intravenous  Once 08/05/23 1641 08/06/23 0547       Subjective: Patient seen and examined at the bedside today.  No new changes.  No new complaints.  Remains comfortable.  On room air.  No cough or shortness of breath.  Just wants to get out of here.  Requesting for discharge as soon as possible.  I met with family around noon time and  discussed about the current plan.  TOC working with family about SNF, awaiting auth  Objective: Vitals:   08/08/23 1301 08/08/23 2010 08/09/23 0513 08/09/23 1449  BP: 117/67 (!) 154/81 130/64 124/80  Pulse: 78 87 71 82  Resp: 18 18 18 16   Temp: 98.3 F (36.8 C) 98.5 F (36.9 C) 98.1 F (36.7 C) 97.7 F (36.5 C)  TempSrc: Oral  Oral Oral  SpO2: 100% 99% 95% 100%  Weight:      Height:        Intake/Output Summary (Last 24 hours) at 08/09/2023 1455 Last data filed at 08/09/2023 9094 Gross per 24 hour  Intake 470 ml  Output 625 ml  Net -155 ml   Filed Weights   08/05/23 1343 08/05/23 2115  Weight: 60 kg 56.5 kg    Examination:      General exam: Overall comfortable, not in distress, pleasant elderly male, thin built HEENT: PERRL Respiratory system:  no wheezes or crackles  Cardiovascular system: S1 & S2 heard, RRR.  Gastrointestinal system: Abdomen is nondistended, soft and nontender. Central nervous system: Alert and oriented Extremities: No edema, no clubbing ,no cyanosis,sling on the left arm Skin: No rashes, no  ulcers,no icterus     Data Reviewed: I have personally reviewed following labs and imaging studies  CBC: Recent Labs  Lab 08/05/23 1621 08/06/23 0457 08/07/23 1022  WBC 12.4* 11.0* 14.1*  NEUTROABS 10.9*  --   --   HGB 9.6* 8.6* 9.2*  HCT 30.8* 27.0* 31.1*  MCV 85.6 84.4 87.9  PLT 244 214 254   Basic Metabolic Panel: Recent Labs  Lab 08/05/23 1621 08/06/23 0457 08/07/23 1022  NA 133* 134* 133*  K 3.2* 3.5 3.0*  CL 99 101 105  CO2 25 21* 19*  GLUCOSE 101* 76 197*  BUN 15 12 12   CREATININE 0.68 0.63 0.65  CALCIUM  8.4* 8.0* 7.9*  MG 1.8 1.6*  --   PHOS 2.6 2.9  --      Recent Results (from the past 240 hours)  SARS Coronavirus 2 by RT PCR (hospital order, performed in [facility] hospital lab) *cepheid single  result test* Anterior Nasal Swab     Status: None   Collection Time: 08/05/23  7:32 PM   Specimen: Anterior Nasal Swab  Result Value Ref Range Status   SARS Coronavirus 2 by RT PCR NEGATIVE NEGATIVE Final    Comment: (NOTE) SARS-CoV-2 target nucleic acids are NOT DETECTED.  The SARS-CoV-2 RNA is generally detectable in upper and lower respiratory specimens during the acute phase of infection. The lowest concentration of SARS-CoV-2 viral copies this assay can detect is 250 copies / mL. A negative result does not preclude SARS-CoV-2 infection and should not be used as the sole basis for treatment or other patient management decisions.  A negative result may occur with improper specimen collection / handling, submission of specimen other than nasopharyngeal swab, presence of viral mutation(s) within the areas targeted by this assay, and inadequate number of viral copies (<250 copies / mL). A negative result must be combined with clinical observations, patient history, and epidemiological information.  Fact Sheet for Patients:   RoadLapTop.co.za  Fact Sheet for Healthcare Providers: http://kim-miller.com/  This  test is not yet approved or  cleared by the [place]  FDA and has been authorized for detection and/or diagnosis of SARS-CoV-2 by FDA under an Emergency Use Authorization (EUA).  This EUA will remain in effect (meaning this test can be used) for the duration of the COVID-19 declaration under Section 564(b)(1) of the Act, 21 U.S.C. section 360bbb-3(b)(1), unless the authorization is terminated or revoked sooner.  Performed at [facility], [address] [place], [place] [address]   Culture, blood (x 2)     Status: None (Preliminary result)   Collection Time: 08/05/23  9:26 PM   Specimen: BLOOD  Result Value Ref Range Status   Specimen Description   Final    BLOOD BLOOD RIGHT ARM Performed at [facility], [address] [place], [place] [address]    Special Requests   Final    BOTTLES DRAWN AEROBIC ONLY Blood Culture results may not be optimal due to an inadequate volume of blood received in culture bottles Performed at [facility], [address] [place], [place] [address]    Culture   Final    NO GROWTH 3 DAYS Performed at [facility], [address] [place], [place] [address]    Report Status PENDING  Incomplete  Culture, blood (x 2)     Status: None (Preliminary result)   Collection Time: 08/05/23  9:30 PM   Specimen: BLOOD  Result Value Ref Range Status   Specimen Description   Final    BLOOD BLOOD RIGHT HAND Performed at [facility], [address] [place], [place] [address]    Special Requests   Final    BOTTLES DRAWN AEROBIC ONLY Blood Culture results may not be optimal due to an inadequate volume of blood received in culture bottles Performed at [facility], [address] [place], [place] [address]    Culture   Final    NO GROWTH 3 DAYS Performed at [facility], [address] [place], [place] [address]    Report Status PENDING  Incomplete      Radiology Studies: DG Humerus Left Result Date: 08/09/2023 CLINICAL DATA:  Follow-up for left humerus fracture. EXAM: LEFT HUMERUS - 2+ VIEW COMPARISON:  06/19/2023. FINDINGS: Redemonstrated comminuted and displaced fracture extending from the proximal humeral metaphysis through the mid diaphysis with persistent but slightly improved degree of  displacement compared to the prior exam. Minimal marginal callus formation along the distal fracture margin without evidence of osseous bridging. Soft tissues are grossly unremarkable. IMPRESSION: Comminuted and displaced fracture of the left proximal to mid humerus with persistent slightly improved degree of displacement compared to the prior exam. Minimal marginal callus formation along the distal fracture margin without evidence of osseous bridging. Electronically Signed   By: Harrietta Sherry M.D.   On: 08/09/2023 12:13   DG Knee 1-2 Views Right Result Date: 08/09/2023 CLINICAL DATA:  Pain. EXAM: RIGHT KNEE - 1-2 VIEW COMPARISON:  CT of the right knee dated 07/02/2023. FINDINGS: Diffuse osseous demineralization. Known nondisplaced fracture of the lateral patella is better visualized on the prior CT dated 07/02/2023. The remainder of the visualized bones are intact. Mild degenerative arthropathy of the knee with chondrocalcinosis in the medial and lateral femorotibial compartments. IMPRESSION: 1. Known nondisplaced fracture of the lateral patella is better visualized on the prior CT dated 07/02/2023. The remainder of the visualized bones are intact. 2. Mild tricompartmental osteoarthritis with chondrocalcinosis. Electronically Signed   By: Harrietta Sherry M.D.   On: 08/09/2023 12:05     Scheduled Meds:  aspirin  EC  81 mg Oral Daily   bisacodyl   10 mg Rectal Once   feeding supplement  237 mL Oral TID BM   ferrous sulfate   325 mg Oral Q breakfast   finasteride   5 mg Oral Daily   guaiFENesin   600 mg Oral BID   insulin  aspart  0-9 Units Subcutaneous TID AC &  HS   levofloxacin   750 mg Oral q1800   lipase/protease/amylase  12,000 Units Oral TID AC   multivitamin with minerals  1 tablet Oral Daily   pantoprazole   40 mg Oral Daily   polyethylene glycol  17 g Oral Daily   senna-docusate  1 tablet Oral BID   thiamine   100 mg Oral Daily   Continuous Infusions:     LOS: 3 days   Ivonne Mustache, MD [facility] P8/08/2023, 2:55 PM

## 2023-08-09 NOTE — Progress Notes (Signed)
 Patient refused morning labs (they did try several times overnight unsuccessfully). Provider notified and aware.

## 2023-08-09 NOTE — Progress Notes (Signed)
 Physical Therapy Treatment Patient Details Name: Francisco [name] MRN: [id number] DOB: October 17, 1933 Today's Date: 08/09/2023   History of Present Illness Pt is a 89 yr old male who presented 08/05/23 due to weakness and UTI. CXR showed pulmonary edema,chronic interstitial lung disease, atypical pneumonia.  PMH:  Dm2, pancreatic mass, recent left arm fracture in sling (6/25), BPH, hypertension, GERD and Barrett's esophagus    PT Comments  PT - Cognition Comments: AxO x 2.5 requiring some repeated instructions then Pt's masks his confusion with anger/grumpy/out bursts repeating I don't give a damn. Then he also says, There are nice here (staff)  Max encouragement to participate.  Quick to say, I'm NOT doing that. Co Tx with OT due to Pt's non compliance/MAX encouragement to participate as well as Pt safety. Assisted OOB to amb to bathroom was difficult.  General Gait Details: Used + 1 HHA on RIGHT side as L UE remains in sling.  Very unsteady gait with decreased weight shift to L LE today due to c/o foot pain in the arch.  R LE fully supporting with NO knee buckle (s/p patellar Fx).  Only tolerated amb to and from bathroom this session.  Too unsteady to attempt gait with a cane.  Will try a HEMI walker next session. LPT has rec Pt will need ST Rehab at SNF to address mobility and functional decline prior to safely returning home.    If plan is discharge home, recommend the following:     Can travel by private vehicle     No  Equipment Recommendations  None recommended by PT    Recommendations for Other Services       Precautions / Restrictions Precautions Precautions: Fall Precaution/Restrictions Comments: Per 07/30/23 office visit:  L patellar Fx WBAT no KI once QUADS are strong enough to support.  L Prox Humerus Fx NWB cont sling at night/off during day, ROM to wrist/elbow and shoulder pendulum/active ABD as tolerated.  Next Ortho f/u end of August Restrictions Weight Bearing  Restrictions Per Provider Order: Yes LUE Weight Bearing Per Provider Order: Non weight bearing RLE Weight Bearing Per Provider Order: Weight bearing as tolerated Other Position/Activity Restrictions: no longer needs LI for amb as Quads are strong enough present with no knee buckle     Mobility  Bed Mobility Overal bed mobility: Needs Assistance Bed Mobility: Supine to Sit, Sit to Supine     Supine to sit: HOB elevated, Mod assist Sit to supine: Contact guard assist   General bed mobility comments: assist to scoot fully to EOB, assist to lift trunk via handheld assist; declined to use bedrail. CGA and increased effort/encouragement to bring BLE back into bed with increased time    Transfers Overall transfer level: Needs assistance Equipment used: 1 person hand held assist Transfers: Sit to/from Stand Sit to Stand: Mod assist           General transfer comment: Mod A to stand from bedside w/ handheld assist R UE due to NWBing L UE in sling.  Unsteady esp with turns.  Pt reaching to support self using anything I can grab (furniture) Required HHA for support.  Too unsteady for a cane.    Ambulation/Gait Ambulation/Gait assistance: Mod assist, Max assist Gait Distance (Feet): 7 Feet Assistive device: 1 person hand held assist, IV Pole Gait Pattern/deviations: Step-to pattern, Decreased stride length, Trunk flexed, Wide base of support, Shuffle, Decreased stance time - right Gait velocity: decreased     General Gait Details: Used + 1  HHA on RIGHT side as L UE remains in sling.  Very unsteady gait with decreased weight shift to L LE today due to c/o foot pain in the arch.  R LE fully supporting with NO knee buckle (s/p patellar Fx).  Only tolerated amb to and from bathroom this session.  Too unsteady to attempt gait with a cane.  Will try a HEMI walker next session.   Stairs             Wheelchair Mobility     Tilt Bed    Modified Rankin (Stroke Patients Only)        Balance                                            Communication Communication Communication: Impaired Factors Affecting Communication: Hearing impaired  Cognition Arousal: Alert Behavior During Therapy: WFL for tasks assessed/performed, Agitated ([place])   PT - Cognitive impairments: Safety/Judgement, Memory, Awareness, Difficult to assess                       PT - Cognition Comments: AxO x 2.5 requiring some repeated instructions then Pt's masks his confusion with anger/grumpy/out bursts repeating I don't give a damn. Then he also says, There are nice here (staff)  Max encouragement to participate.  Quick to say, I'm NOT doing that. Following commands: Impaired Following commands impaired: Only follows one step commands consistently, Follows one step commands with increased time    Cueing Cueing Techniques: Verbal cues  Exercises      General Comments        Pertinent Vitals/Pain Pain Assessment Pain Assessment: Faces Faces Pain Scale: Hurts a little bit Pain Location: L arm, back Pain Descriptors / Indicators: Grimacing, Guarding, Moaning    Home Living                          Prior Function            PT Goals (current goals can now be found in the care plan section) Progress towards PT goals: Progressing toward goals    Frequency    Min 2X/week      PT Plan      Co-evaluation PT/OT/SLP Co-Evaluation/Treatment: Yes Reason for Co-Treatment: For patient/therapist safety PT goals addressed during session: Mobility/safety with mobility OT goals addressed during session: Strengthening/ROM;ADL's and self-care      AM-PAC PT 6 Clicks Mobility   Outcome Measure  Help needed turning from your back to your side while in a flat bed without using bedrails?: A Lot Help needed moving from lying on your back to sitting on the side of a flat bed without using bedrails?: A Lot Help needed moving to and from a  bed to a chair (including a wheelchair)?: A Lot Help needed standing up from a chair using your arms (e.g., wheelchair or bedside chair)?: A Lot Help needed to walk in hospital room?: A Lot Help needed climbing 3-5 steps with a railing? : Total 6 Click Score: 11    End of Session Equipment Utilized During Treatment: Gait belt Activity Tolerance: Patient limited by fatigue Patient left: in bed;with call bell/phone within reach;with bed alarm set Nurse Communication: Mobility status PT Visit Diagnosis: Other abnormalities of gait and mobility (R26.89);Muscle weakness (generalized) (M62.81)     Time: 8661-8598 PT Time  Calculation (min) (ACUTE ONLY): 23 min  Charges:    $Gait Training: 8-22 mins PT General Charges $$ ACUTE PT VISIT: 1 Visit                     Katheryn Leap  PTA Acute  Rehabilitation Services Office M-F          [phone]

## 2023-08-09 NOTE — Progress Notes (Signed)
 Occupational Therapy Treatment Patient Details Name: Francisco [name] MRN: [id number] DOB: August 06, 1933 Today's Date: 08/09/2023   History of present illness Pt is a 89 yr old male who presented 08/05/23 due to weakness and UTI. CXR showed pulmonary edema,chronic interstitial lung disease, atypical pneumonia.  PMH:  Dm2, pancreatic mass, recent left arm fracture in sling (6/25), BPH, hypertension, GERD and Barrett's esophagus   OT comments  Pt making incremental progress towards OT goals, able to mobilize to/from bathroom with Min A via handheld assist (& furniture walking). Pt not receptive to ADL retraining during session today, also decreased receptiveness to safety recommendations when pt observed to furniture walk. Pt requires extensive assist for most ADLs d/t balance impairments and impaired use of L nondominant UE. Continue to recommend inpatient follow up therapy, <3 hours/day at DC pending pt's improved engagement with acute therapies.        If plan is discharge home, recommend the following:  A lot of help with walking and/or transfers;A lot of help with bathing/dressing/bathroom;Assistance with cooking/housework   Equipment Recommendations  None recommended by OT    Recommendations for Other Services      Precautions / Restrictions Precautions Precautions: Fall Recall of Precautions/Restrictions: Impaired Precaution/Restrictions Comments: Per 07/30/23 office visit:  L patellar Fx WBAT no KI once QUADS are strong enough to support.  L Prox Humerus Fx NWB cont sling at night/off during day, ROM to wrist/elbow and shoulder pendulum/active ABD as tolerated.  Next Ortho f/u end of August Restrictions Weight Bearing Restrictions Per Provider Order: Yes LUE Weight Bearing Per Provider Order: Non weight bearing RLE Weight Bearing Per Provider Order: Weight bearing as tolerated       Mobility Bed Mobility Overal bed mobility: Needs Assistance Bed Mobility: Supine to Sit, Sit to  Supine     Supine to sit: HOB elevated, Mod assist Sit to supine: Contact guard assist   General bed mobility comments: assist to scoot fully to EOB, assist to lift trunk via handheld assist; declined to use bedrail. CGA and increased effort/encouragement to bring BLE back into bed    Transfers Overall transfer level: Needs assistance Equipment used: 1 person hand held assist Transfers: Sit to/from Stand Sit to Stand: Mod assist           General transfer comment: Mod A to stand from bedside w/ handheld assist     Balance Overall balance assessment: Needs assistance Sitting-balance support: Feet supported Sitting balance-Leahy Scale: Fair     Standing balance support: Single extremity supported, During functional activity Standing balance-Leahy Scale: Poor                             ADL either performed or assessed with clinical judgement   ADL Overall ADL's : Needs assistance/impaired                     Lower Body Dressing: Total assistance;Sitting/lateral leans Lower Body Dressing Details (indicate cue type and reason): to don socks sitting EOB; pt able to bend to reach to mid-calf but declined to attempt any further             Functional mobility during ADLs: Minimal assistance General ADL Comments: handheld assist to walk to/from bathroom, also furniture walking    Extremity/Trunk Assessment Upper Extremity Assessment Upper Extremity Assessment: Right hand dominant;LUE deficits/detail LUE Deficits / Details: LUE readjusted in sling. able to use hand to hold items during session  Lower Extremity Assessment Lower Extremity Assessment: Defer to PT evaluation        Vision   Vision Assessment?: No apparent visual deficits   Perception     Praxis     Communication Communication Communication: Impaired Factors Affecting Communication: Hearing impaired   Cognition Arousal: Alert Behavior During Therapy: Agitated Cognition: No  family/caregiver present to determine baseline             OT - Cognition Comments: pt agitated with any ADL requests/OOB activity reporting he doesnt need to do any of these things. Redirecting frequently for pt to tolerate some activities, decreased motivation to engage- reports he doesnt need to do any of that stupid stuff                 Following commands: Impaired Following commands impaired: Only follows one step commands consistently, Follows one step commands with increased time      Cueing   Cueing Techniques: Verbal cues  Exercises      Shoulder Instructions       General Comments      Pertinent Vitals/ Pain       Pain Assessment Pain Assessment: Faces Faces Pain Scale: Hurts little more Pain Location: L arm, back Pain Descriptors / Indicators: Grimacing, Guarding, Moaning Pain Intervention(s): Monitored during session  Home Living                                          Prior Functioning/Environment              Frequency  Min 1X/week        Progress Toward Goals  OT Goals(current goals can now be found in the care plan section)  Progress towards OT goals: OT to reassess next treatment  Acute Rehab OT Goals Patient Stated Goal: get out of this hospital OT Goal Formulation: With patient Time For Goal Achievement: 08/20/23 Potential to Achieve Goals: Good ADL Goals Pt Will Perform Eating: with modified independence;sitting Pt Will Perform Grooming: with modified independence;sitting Pt Will Perform Upper Body Bathing: with contact guard assist;sitting Pt Will Perform Lower Body Bathing: with max assist;sitting/lateral leans Pt Will Perform Upper Body Dressing: with min assist;sitting Pt Will Perform Lower Body Dressing: with max assist;sitting/lateral leans Pt Will Transfer to Toilet: with mod assist;bedside commode  Plan      Co-evaluation    PT/OT/SLP Co-Evaluation/Treatment: Yes Reason for Co-Treatment:  For patient/therapist safety;To address functional/ADL transfers;Other (comment) (to progress activity) PT goals addressed during session: Mobility/safety with mobility OT goals addressed during session: Strengthening/ROM;ADL's and self-care      AM-PAC OT 6 Clicks Daily Activity     Outcome Measure   Help from another person eating meals?: A Little Help from another person taking care of personal grooming?: A Little Help from another person toileting, which includes using toliet, bedpan, or urinal?: Total Help from another person bathing (including washing, rinsing, drying)?: A Lot Help from another person to put on and taking off regular upper body clothing?: A Lot Help from another person to put on and taking off regular lower body clothing?: Total 6 Click Score: 12    End of Session Equipment Utilized During Treatment: Gait belt;Other (comment) (sling)  OT Visit Diagnosis: Unsteadiness on feet (R26.81);Other abnormalities of gait and mobility (R26.89);Repeated falls (R29.6);Muscle weakness (generalized) (M62.81);Pain Pain - Right/Left: Left Pain - part of body: Shoulder  Activity Tolerance Patient tolerated treatment well   Patient Left in bed;with call bell/phone within reach;with bed alarm set   Nurse Communication Mobility status        Time: 8661-8598 OT Time Calculation (min): 23 min  Charges: OT General Charges $OT Visit: 1 Visit OT Treatments $Therapeutic Activity: 8-22 mins  Mliss NOVAK, OTR/L Acute Rehab Services Office: [phone]   Mliss Fish 08/09/2023, 2:10 PM

## 2023-08-09 NOTE — Progress Notes (Signed)
 I have been treating this patient as an outpatient for his fractured left humerus.  He is approximately 6 weeks after his fracture.  When last seen in the office last week he had no pain in the arm but had minimal callus on his x-rays.  I recommended continued protection of that left arm.  He had minimal callus formation.  His hospital labs indicate that he is malnourished with an albumin  of 1.8.  His x-rays taken today demonstrate minimal callus formation.  Therefore feel he needs continued protection of that fracture site.  I have called his wife and asked her to bring in his left arm splint that I would recommend that he use at nighttime and when he is out of bed.  When he is sitting in bed then he can have that off to do range of motion of his elbow and of his shoulder.  He should not put any weight on that left arm either.  Needs nutritional augmentation.  And I would like to see him in 3 weeks or so in my office for x-rays and follow-up.

## 2023-08-09 NOTE — Plan of Care (Signed)
  Problem: Coping: Goal: Ability to adjust to condition or change in health will improve Outcome: Progressing   Problem: Nutritional: Goal: Maintenance of adequate nutrition will improve Outcome: Progressing   Problem: Skin Integrity: Goal: Risk for impaired skin integrity will decrease Outcome: Progressing   Problem: Fluid Volume: Goal: Hemodynamic stability will improve Outcome: Progressing

## 2023-08-10 ENCOUNTER — Other Ambulatory Visit (HOSPITAL_COMMUNITY): Payer: Self-pay

## 2023-08-10 DIAGNOSIS — K219 Gastro-esophageal reflux disease without esophagitis: Secondary | ICD-10-CM | POA: Diagnosis not present

## 2023-08-10 DIAGNOSIS — J189 Pneumonia, unspecified organism: Secondary | ICD-10-CM | POA: Diagnosis not present

## 2023-08-10 DIAGNOSIS — F4322 Adjustment disorder with anxiety: Secondary | ICD-10-CM | POA: Diagnosis not present

## 2023-08-10 DIAGNOSIS — R4189 Other symptoms and signs involving cognitive functions and awareness: Secondary | ICD-10-CM | POA: Diagnosis not present

## 2023-08-10 DIAGNOSIS — I10 Essential (primary) hypertension: Secondary | ICD-10-CM | POA: Diagnosis not present

## 2023-08-10 DIAGNOSIS — R5381 Other malaise: Secondary | ICD-10-CM | POA: Diagnosis not present

## 2023-08-10 DIAGNOSIS — N401 Enlarged prostate with lower urinary tract symptoms: Secondary | ICD-10-CM | POA: Diagnosis not present

## 2023-08-10 DIAGNOSIS — E119 Type 2 diabetes mellitus without complications: Secondary | ICD-10-CM | POA: Diagnosis not present

## 2023-08-10 DIAGNOSIS — K2270 Barrett's esophagus without dysplasia: Secondary | ICD-10-CM | POA: Diagnosis not present

## 2023-08-10 DIAGNOSIS — K5900 Constipation, unspecified: Secondary | ICD-10-CM | POA: Diagnosis not present

## 2023-08-10 DIAGNOSIS — M25512 Pain in left shoulder: Secondary | ICD-10-CM | POA: Diagnosis not present

## 2023-08-10 DIAGNOSIS — E43 Unspecified severe protein-calorie malnutrition: Secondary | ICD-10-CM | POA: Diagnosis not present

## 2023-08-10 DIAGNOSIS — D649 Anemia, unspecified: Secondary | ICD-10-CM | POA: Diagnosis not present

## 2023-08-10 DIAGNOSIS — E8809 Other disorders of plasma-protein metabolism, not elsewhere classified: Secondary | ICD-10-CM | POA: Diagnosis not present

## 2023-08-10 DIAGNOSIS — F0390 Unspecified dementia without behavioral disturbance: Secondary | ICD-10-CM | POA: Diagnosis not present

## 2023-08-10 DIAGNOSIS — L299 Pruritus, unspecified: Secondary | ICD-10-CM | POA: Diagnosis not present

## 2023-08-10 DIAGNOSIS — Z7901 Long term (current) use of anticoagulants: Secondary | ICD-10-CM | POA: Diagnosis not present

## 2023-08-10 DIAGNOSIS — M25561 Pain in right knee: Secondary | ICD-10-CM | POA: Diagnosis not present

## 2023-08-10 DIAGNOSIS — E876 Hypokalemia: Secondary | ICD-10-CM | POA: Diagnosis not present

## 2023-08-10 DIAGNOSIS — R627 Adult failure to thrive: Secondary | ICD-10-CM | POA: Diagnosis not present

## 2023-08-10 DIAGNOSIS — G3184 Mild cognitive impairment, so stated: Secondary | ICD-10-CM | POA: Diagnosis not present

## 2023-08-10 DIAGNOSIS — R531 Weakness: Secondary | ICD-10-CM | POA: Diagnosis not present

## 2023-08-10 DIAGNOSIS — R4182 Altered mental status, unspecified: Secondary | ICD-10-CM | POA: Diagnosis not present

## 2023-08-10 LAB — CBC
HCT: 34.9 % — ABNORMAL LOW (ref 39.0–52.0)
Hemoglobin: 10.7 g/dL — ABNORMAL LOW (ref 13.0–17.0)
MCH: 26.8 pg (ref 26.0–34.0)
MCHC: 30.7 g/dL (ref 30.0–36.0)
MCV: 87.3 fL (ref 80.0–100.0)
Platelets: 330 K/uL (ref 150–400)
RBC: 4 MIL/uL — ABNORMAL LOW (ref 4.22–5.81)
RDW: 16.6 % — ABNORMAL HIGH (ref 11.5–15.5)
WBC: 6.5 K/uL (ref 4.0–10.5)
nRBC: 0 % (ref 0.0–0.2)

## 2023-08-10 LAB — BASIC METABOLIC PANEL WITH GFR
Anion gap: 11 (ref 5–15)
BUN: 10 mg/dL (ref 8–23)
CO2: 23 mmol/L (ref 22–32)
Calcium: 8.3 mg/dL — ABNORMAL LOW (ref 8.9–10.3)
Chloride: 101 mmol/L (ref 98–111)
Creatinine, Ser: 0.57 mg/dL — ABNORMAL LOW (ref 0.61–1.24)
GFR, Estimated: >60 mL/min (ref >60)
Glucose, Bld: 143 mg/dL — ABNORMAL HIGH (ref 70–99)
Potassium: 3.7 mmol/L (ref 3.5–5.1)
Sodium: 135 mmol/L (ref 135–145)

## 2023-08-10 LAB — GLUCOSE, CAPILLARY
Glucose-Capillary: 92 mg/dL (ref 70–99)
Glucose-Capillary: 164 mg/dL — ABNORMAL HIGH (ref 70–99)

## 2023-08-10 LAB — VITAMIN B1: Vitamin B1 (Thiamine): 118.1 nmol/L (ref 66.5–200.0)

## 2023-08-10 MED ORDER — VITAMIN B-1 100 MG PO TABS: 100.0000 mg | ORAL_TABLET | Freq: Every day | ORAL | Status: DC

## 2023-08-10 MED ORDER — ADULT MULTIVITAMIN W/MINERALS CH: 1.0000 | ORAL_TABLET | Freq: Every day | ORAL | Status: DC

## 2023-08-10 MED ORDER — FERROUS SULFATE 325 (65 FE) MG PO TABS: 325.0000 mg | ORAL_TABLET | Freq: Every day | ORAL | Status: DC

## 2023-08-10 MED ORDER — LEVOFLOXACIN 750 MG PO TABS
750.0000 mg | ORAL_TABLET | Freq: Every day | ORAL | 0 refills | Status: AC
  Filled 2023-08-10: qty 2, 2d supply, fill #0

## 2023-08-10 MED ORDER — HYDROCODONE-ACETAMINOPHEN 5-325 MG PO TABS: 1.0000 | ORAL_TABLET | Freq: Four times a day (QID) | ORAL | 0 refills | Status: DC | PRN

## 2023-08-10 MED ORDER — POLYETHYLENE GLYCOL 3350 17 G PO PACK: 17.0000 g | PACK | Freq: Every day | ORAL | Status: DC

## 2023-08-10 NOTE — Plan of Care (Signed)
  Problem: Education: Goal: Ability to describe self-care measures that may prevent or decrease complications (Diabetes Survival Skills Education) will improve Outcome: Progressing   Problem: Coping: Goal: Ability to adjust to condition or change in health will improve Outcome: Progressing   Problem: Health Behavior/Discharge Planning: Goal: Ability to identify and utilize available resources and services will improve Outcome: Progressing   Problem: Metabolic: Goal: Ability to maintain appropriate glucose levels will improve Outcome: Progressing   Problem: Fluid Volume: Goal: Hemodynamic stability will improve Outcome: Progressing   Problem: Respiratory: Goal: Ability to maintain adequate ventilation will improve Outcome: Progressing   Problem: Clinical Measurements: Goal: Ability to maintain clinical measurements within normal limits will improve Outcome: Progressing Goal: Will remain free from infection Outcome: Progressing   Problem: Skin Integrity: Goal: Risk for impaired skin integrity will decrease Outcome: Not Progressing   Problem: Activity: Goal: Risk for activity intolerance will decrease Outcome: Not Progressing   Problem: Nutrition: Goal: Adequate nutrition will be maintained Outcome: Not Progressing

## 2023-08-10 NOTE — TOC Transition Note (Signed)
 Transition of Care [facility]) - Discharge Note   Patient Details  Name: Francisco [name] MRN: [id number] Date of Birth: 1933-01-06  Transition of Care [facility]) CM/SW Contact:  Doneta Glenys DASEN, RN Phone Number: 08/10/2023, 1:55 PM   Clinical Narrative:    Per MD patient ready for DC to [facility]. RN to call report prior to discharge [phone]). RN, patient, patient's family, and facility notified of DC. Discharge Summary and FL2 sent to facility in HUB.DC packet on chart includes medical necessity, discharge summary, signed DNR. Ambulance PTAR transport requested for patient at 1:55 PM.  CM will sign off. Please consult us  again if new needs arise.  Final next level of care: Skilled Nursing Facility Barriers to Discharge: Barriers Resolved   Patient Goals and CMS Choice Patient states their goals for this hospitalization and ongoing recovery are:: Rehab CMS Medicare.gov Compare Post Acute Care list provided to:: Patient Choice offered to / list presented to : [name] ownership interest in [facility].provided to:: Patient    Discharge Placement   Existing PASRR number confirmed : 08/10/23          Patient chooses bed at: [facility] Patient to be transferred to facility by: PTAR Name of family member notified: Kerin Cecchi Patient and family notified of of transfer: 08/10/23  Discharge Plan and Services Additional resources added to the After Visit Summary for                                       Social Drivers of Health (SDOH) Interventions SDOH Screenings   Food Insecurity: No Food Insecurity (08/05/2023)  Housing: Low Risk  (08/05/2023)  Transportation Needs: No Transportation Needs (08/05/2023)  Utilities: Not At Risk (08/05/2023)  Social Connections: Socially Isolated (08/05/2023)  Tobacco Use: Medium Risk (08/05/2023)     Readmission Risk Interventions     No data to display

## 2023-08-10 NOTE — TOC Progression Note (Addendum)
 Transition of Care [facility]) - Progression Note    Patient Details  Name: Francisco [name] MRN: [id number] Date of Birth: 06-Sep-1933  Transition of Care [facility]) CM/SW Contact  Doneta Glenys DASEN, RN Phone Number: 08/10/2023, 9:21 AM  Clinical Narrative:    CM received call from [employer] from [place] [phone] that SNF approved for 7 days, auth ref # [id number]. CM called Alfrieda wife to inform her the insurance has approved SNF.  Expected Discharge Plan: Skilled Nursing Facility Barriers to Discharge: Insurance Authorization               Expected Discharge Plan and Services                                               Social Drivers of Health (SDOH) Interventions SDOH Screenings   Food Insecurity: No Food Insecurity (08/05/2023)  Housing: Low Risk  (08/05/2023)  Transportation Needs: No Transportation Needs (08/05/2023)  Utilities: Not At Risk (08/05/2023)  Social Connections: Socially Isolated (08/05/2023)  Tobacco Use: Medium Risk (08/05/2023)    Readmission Risk Interventions     No data to display

## 2023-08-10 NOTE — Progress Notes (Signed)
 PTAR arrived to take patient to [facility]. Patient left the hospital stable, had all personal belongings, and had discharge paperwork packet. RN notified patient's spouse that PTAR had come to pick patient up to take to [facility].

## 2023-08-10 NOTE — Progress Notes (Signed)
 Patient received discharge orders to go to [facility]. RN called report over to [facility] to give report on patient. PTAR has been called. Just waiting for PTAR to come pick up patient.

## 2023-08-10 NOTE — Discharge Summary (Signed)
 Physician Discharge Summary  Francisco [name] FMW:[id number] DOB: 29-Dec-1933 DOA: 08/05/2023  PCP: Shepard Ade, MD  Admit date: 08/05/2023 Discharge date: 08/10/2023  Admitted From: Home Disposition: SNF  Discharge Condition:Stable CODE STATUS: DNR Diet recommendation:  Regular   Brief/Interim Summary: Patient is a 89 year old male with history of diabetes type 2, pancreatic mass s/p resection, recent left arm fracture, BPH, hypertension, GERD, Barrett'sesophagus who was who presented with fatigue from home .  As per the report, he has been increasingly more weak, confused as per family.  At baseline, he is alert and oriented.  There was concern for UTI.  On presentation,he was alert and oriented.  Complained of some mild cough.  Had a mild skin tear on the left arm. UA was not suspicious for UTI.  Chest x-ray showed diffuse mild to moderate increased interstitial markings.  COVID/flu/RSV negative.  Suspected to have community-acquired pneumonia and was started antibiotics.  PT/ OT evaluation done, recommended SNF.  Medically stable for discharge  Following problems were addressed during the hospitalization:  Community-acquired pneumonia: Presented with weakness, cough, confusion.  Chest x-ray showed increased interstitial markings, nonspecific finding.  Afebrile on presentation.  Saturating fine on room air.  Continue ceftriaxone  and azithromycin  for now.  Follow-up sputum culture.  Looks like sample has not been collected yet.  RSV/flu/COVID-negative. Negative strep pneumo urine antigen,Legionella antigen.  Does not look in any kind of respiratory distress this morning.  Speaking in full sentences.     Generalized weakness/confusion: Likely secondary to pneumonia.  PT/OT consulted, recommended SNF.  CT head did not show any acute intracranial abnormalities but showed atrophy and chronic small vessel ischemic changes of the white matter.  Early dementia not ruled out.  He is mostly oriented    History of BPH: Continue Proscar    Hypomagnesemia/hypokalemia: Magnesium /K supplemented and corrected   Diabetes type 2:   On Jardiance  at home   GERD: Continue PPI   Iron deficiency normocytic anemia: Hemoglobin currently in the range of 8.  Iron panel showed low iron.  Started on oral iron supplementation.  No report of hematochezia or melena.  Low suspicion for GI bleed.  Negative FOBT   Recent history of left upper extremity fracture/right patellar fracture: Happened in  June 2025, follows with orthopedics.  Currently in sling.  PT consulted here.  He has appointment with orthopedics as an outpatient. X-ray of the left humerus and right knee for follow-up, shows stable changes.  Continue nonweightbearing on the left upper extremity.  Apply splint on the left arm and wear it while at night and while moving.  He needs to follow-up with orthopedics as an outpatient.   Severe protein calorie malnutrition/low albumin : Albumin  of just 1.8.  Added Glucerna.  Dietitian was consulted and following.   Constipation: Continue bowel regimen   Discharge Diagnoses:  Principal Problem:   CAP (community acquired pneumonia) Active Problems:   Benign prostatic hyperplasia   Diabetes mellitus type 2, uncomplicated (HCC)   Gastro-esophageal reflux disease without esophagitis   Hypokalemia   Anemia   Debility   Hypoalbuminemia   Protein-calorie malnutrition, severe    Discharge Instructions  Discharge Instructions     Diet general   Complete by: As directed    Discharge instructions   Complete by: As directed    1)Please take your medications as instructed 2)Do a CBC and BMP tests in a week 3)Follow up orthopedics as an outpatient   Discharge wound care:   Complete by: As directed  As per wound care nurse   Increase activity slowly   Complete by: As directed       Allergies as of 08/10/2023       Reactions   Metformin Hcl Other (See Comments)   GI upset        Medication  List     STOP taking these medications    oxybutynin  5 MG tablet Commonly known as: DITROPAN    oxyCODONE  5 MG immediate release tablet Commonly known as: Oxy IR/ROXICODONE    oxyCODONE -acetaminophen  5-325 MG tablet Commonly known as: PERCOCET/ROXICET   torsemide  20 MG tablet Commonly known as: DEMADEX        TAKE these medications    Accu-Chek Guide test strip Generic drug: glucose blood Use twice daily as directed   Accu-Chek Guide Test test strip Generic drug: glucose blood Use 1 strip as directed to test blood sugar twice daily   Align 4 MG Caps Take 4 mg by mouth daily.   aspirin  EC 81 MG tablet Take 81 mg by mouth daily. Swallow whole.   Creon  3000-9500 units Cpep Generic drug: Pancrelipase  (Lip-Prot-Amyl) Take 2 capsules (6,000 Units total) by mouth 3 (three) times daily before meals (breakfast, lunch, and supper).   docusate sodium  100 MG capsule Commonly known as: COLACE Take 1 capsule (100 mg total) by mouth 2 (two) times daily.   esomeprazole 40 MG capsule Commonly known as: NEXIUM Take 40 mg by mouth 2 (two) times daily.   ferrous sulfate  325 (65 FE) MG tablet Take 1 tablet (325 mg total) by mouth daily with breakfast. Start taking on: August 11, 2023   finasteride  5 MG tablet Commonly known as: PROSCAR  Take 1 tablet (5 mg total) by mouth daily.   furosemide  20 MG tablet Commonly known as: LASIX  Take 1 tablet (20 mg total) by mouth daily as needed for leg swelling   HYDROcodone -acetaminophen  5-325 MG tablet Commonly known as: NORCO/VICODIN Take 1 tablet by mouth every 4 (four) hours as needed for moderate pain (pain score 4-6).   Jardiance  10 MG Tabs tablet Generic drug: empagliflozin  Take 1 tablet (10 mg total) by mouth daily. What changed: how much to take   levofloxacin  750 MG tablet Commonly known as: LEVAQUIN  Take 1 tablet (750 mg total) by mouth daily at 6 PM for 2 days.   loperamide  2 MG tablet Commonly known as: IMODIUM   A-D Take 4 mg by mouth 2 (two) times daily.   multivitamin with minerals Tabs tablet Take 1 tablet by mouth daily. Start taking on: August 11, 2023   polyethylene glycol 17 g packet Commonly known as: MIRALAX  / GLYCOLAX  Take 17 g by mouth daily. Start taking on: August 11, 2023   thiamine  100 MG tablet Commonly known as: Vitamin B-1 Take 1 tablet (100 mg total) by mouth daily. Start taking on: August 11, 2023   triamcinolone  cream 0.1 % Commonly known as: KENALOG  Apply 1 Application topically 3 (three) times daily as needed for swelling               Discharge Care Instructions  (From admission, onward)           Start     Ordered   08/10/23 0000  Discharge wound care:       Comments: As per wound care nurse   08/10/23 1140            Contact information for after-discharge care     Destination     [facility] .  Service: Skilled Nursing Contact information: [address] [place]  [phone]                    Allergies  Allergen Reactions   Metformin Hcl Other (See Comments)    GI upset    Consultations: None   Procedures/Studies: DG Humerus Left Result Date: 08/09/2023 CLINICAL DATA:  Follow-up for left humerus fracture. EXAM: LEFT HUMERUS - 2+ VIEW COMPARISON:  06/19/2023. FINDINGS: Redemonstrated comminuted and displaced fracture extending from the proximal humeral metaphysis through the mid diaphysis with persistent but slightly improved degree of displacement compared to the prior exam. Minimal marginal callus formation along the distal fracture margin without evidence of osseous bridging. Soft tissues are grossly unremarkable. IMPRESSION: Comminuted and displaced fracture of the left proximal to mid humerus with persistent slightly improved degree of displacement compared to the prior exam. Minimal marginal callus formation along the distal fracture margin without evidence of  osseous bridging. Electronically Signed   By: Harrietta Sherry M.D.   On: 08/09/2023 12:13   DG Knee 1-2 Views Right Result Date: 08/09/2023 CLINICAL DATA:  Pain. EXAM: RIGHT KNEE - 1-2 VIEW COMPARISON:  CT of the right knee dated 07/02/2023. FINDINGS: Diffuse osseous demineralization. Known nondisplaced fracture of the lateral patella is better visualized on the prior CT dated 07/02/2023. The remainder of the visualized bones are intact. Mild degenerative arthropathy of the knee with chondrocalcinosis in the medial and lateral femorotibial compartments. IMPRESSION: 1. Known nondisplaced fracture of the lateral patella is better visualized on the prior CT dated 07/02/2023. The remainder of the visualized bones are intact. 2. Mild tricompartmental osteoarthritis with chondrocalcinosis. Electronically Signed   By: Harrietta Sherry M.D.   On: 08/09/2023 12:05   CT Head Wo Contrast Result Date: 08/05/2023 CLINICAL DATA:  Headache EXAM: CT HEAD WITHOUT CONTRAST TECHNIQUE: Contiguous axial images were obtained from the base of the skull through the vertex without intravenous contrast. RADIATION DOSE REDUCTION: This exam was performed according to the departmental dose-optimization program which includes automated exposure control, adjustment of the mA and/or kV according to patient size and/or use of iterative reconstruction technique. COMPARISON:  CT 06/19/2023 FINDINGS: Brain: No acute territorial infarction, hemorrhage or intracranial mass. Atrophy and moderate chronic small vessel ischemic changes of the white matter. 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 Stable ventricle size. Vascular: No hyperdense vessels. Vertebral and carotid vascular calcification Skull: No fracture Sinuses/Orbits: Right mastoid effusion. Sphenoid sinus wall thickening and moderate mucosal thickening. Mild ethmoid mucosal thickening Other: None IMPRESSION: 1. No CT evidence for acute intracranial abnormality. 2. Atrophy and  chronic small vessel ischemic changes of the white matter. 3. Chronic sphenoid sinus disease. Right mastoid effusion. Electronically Signed   By: Luke Bun M.D.   On: 08/05/2023 18:32   DG Chest Portable 1 View Result Date: 08/05/2023 CLINICAL DATA:  Confusion.  Weakness. EXAM: PORTABLE CHEST 1 VIEW COMPARISON:  08/11/2020. FINDINGS: Low lung volume. There are diffuse mild-to-moderately increased interstitial markings, which is nonspecific. Differential diagnosis includes pulmonary edema, chronic interstitial lung disease, atypical pneumonia, etc. Correlate clinically. Bilateral lung fields are otherwise clear. Bilateral costophrenic angles are clear. Stable cardio-mediastinal silhouette. Unfolding of aortic arch noted. No acute osseous abnormalities. The soft tissues are within normal limits. IMPRESSION: Diffuse mild-to-moderately increased interstitial markings, which is nonspecific. Differential diagnosis includes pulmonary edema, chronic interstitial lung disease, atypical pneumonia, etc. Electronically Signed   By: Ree Molt M.D.   On: 08/05/2023 14:39  Subjective: Patient seen and examined at bedside today.  Comfortable.  Hemodynamically stable.  Not in any Distress.  Alert and oriented.  On room air.  Any shortness of breath or cough.  Medically stable for discharge to SNF.  Discharge planning discussed with family at bedside.  Discharge Exam: Vitals:   08/09/23 2000 08/10/23 0450  BP: 119/63 133/71  Pulse: 65 65  Resp: 16 18  Temp: 98.2 F (36.8 C) 97.9 F (36.6 C)  SpO2: 99% 98%   Vitals:   08/09/23 0513 08/09/23 1449 08/09/23 2000 08/10/23 0450  BP: 130/64 124/80 119/63 133/71  Pulse: 71 82 65 65  Resp: 18 16 16 18   Temp: 98.1 F (36.7 C) 97.7 F (36.5 C) 98.2 F (36.8 C) 97.9 F (36.6 C)  TempSrc: Oral Oral Oral Oral  SpO2: 95% 100% 99% 98%  Weight:      Height:        General: Pt is alert, awake, not in acute distress Cardiovascular: RRR, S1/S2 +, no  rubs, no gallops Respiratory: CTA bilaterally, no wheezing, no rhonchi Abdominal: Soft, NT, ND, bowel sounds + Extremities: no edema, no cyanosis, sling in the left upper extremity    The results of significant diagnostics from this hospitalization (including imaging, microbiology, ancillary and laboratory) are listed below for reference.     Microbiology: Recent Results (from the past 240 hours)  SARS Coronavirus 2 by RT PCR (hospital order, performed in [facility] hospital lab) *cepheid single result test* Anterior Nasal Swab     Status: None   Collection Time: 08/05/23  7:32 PM   Specimen: Anterior Nasal Swab  Result Value Ref Range Status   SARS Coronavirus 2 by RT PCR NEGATIVE NEGATIVE Final    Comment: (NOTE) SARS-CoV-2 target nucleic acids are NOT DETECTED.  The SARS-CoV-2 RNA is generally detectable in upper and lower respiratory specimens during the acute phase of infection. The lowest concentration of SARS-CoV-2 viral copies this assay can detect is 250 copies / mL. A negative result does not preclude SARS-CoV-2 infection and should not be used as the sole basis for treatment or other patient management decisions.  A negative result may occur with improper specimen collection / handling, submission of specimen other than nasopharyngeal swab, presence of viral mutation(s) within the areas targeted by this assay, and inadequate number of viral copies (<250 copies / mL). A negative result must be combined with clinical observations, patient history, and epidemiological information.  Fact Sheet for Patients:   RoadLapTop.co.za  Fact Sheet for Healthcare Providers: http://kim-miller.com/  This test is not yet approved or  cleared by the [place]  FDA and has been authorized for detection and/or diagnosis of SARS-CoV-2 by FDA under an Emergency Use Authorization (EUA).  This EUA will remain in effect (meaning this test can  be used) for the duration of the COVID-19 declaration under Section 564(b)(1) of the Act, 21 U.S.C. section 360bbb-3(b)(1), unless the authorization is terminated or revoked sooner.  Performed at [facility], [address], [place], [place] [address]   Culture, blood (x 2)     Status: None (Preliminary result)   Collection Time: 08/05/23  9:26 PM   Specimen: BLOOD  Result Value Ref Range Status   Specimen Description   Final    BLOOD BLOOD RIGHT ARM Performed at [facility], [address], [place], [place] [address]    Special Requests   Final    BOTTLES DRAWN AEROBIC ONLY Blood Culture results may  not be optimal due to an inadequate volume of blood received in culture bottles Performed at [facility], [address] [place], [place] [address]    Culture   Final    NO GROWTH 4 DAYS Performed at [facility], [address] [place], [place] [address]    Report Status PENDING  Incomplete  Culture, blood (x 2)     Status: None (Preliminary result)   Collection Time: 08/05/23  9:30 PM   Specimen: BLOOD  Result Value Ref Range Status   Specimen Description   Final    BLOOD BLOOD RIGHT HAND Performed at [facility], [address], [place], [place] [address]    Special Requests   Final    BOTTLES DRAWN AEROBIC ONLY Blood Culture results may not be optimal due to an inadequate volume of blood received in culture bottles Performed at [facility], [address] [place], [place] [address]    Culture   Final    NO GROWTH 4 DAYS Performed at [facility], [address], [place], [place] [address]    Report Status PENDING  Incomplete     Labs: BNP (last 3 results) Recent Labs    08/05/23 1945  BNP 154.6*   Basic Metabolic Panel: Recent Labs  Lab 08/05/23 1621 08/06/23 0457 08/07/23 1022 08/10/23 0945  NA 133* 134* 133* 135  K 3.2* 3.5 3.0* 3.7  CL 99  101 105 101  CO2 25 21* 19* 23  GLUCOSE 101* 76 197* 143*  BUN 15 12 12 10   CREATININE 0.68 0.63 0.65 0.57*  CALCIUM  8.4* 8.0* 7.9* 8.3*  MG 1.8 1.6*  --   --   PHOS 2.6 2.9  --   --    Liver Function Tests: Recent Labs  Lab 08/05/23 1621 08/06/23 0457  AST 22 20  ALT 15 15  ALKPHOS 165* 147*  BILITOT 0.7 0.9  PROT 5.4* 5.1*  ALBUMIN  1.7* 1.8*   No results for input(s): LIPASE, AMYLASE in the last 168 hours. Recent Labs  Lab 08/05/23 1401  AMMONIA <13   CBC: Recent Labs  Lab 08/05/23 1621 08/06/23 0457 08/07/23 1022 08/10/23 0945  WBC 12.4* 11.0* 14.1* 6.5  NEUTROABS 10.9*  --   --   --   HGB 9.6* 8.6* 9.2* 10.7*  HCT 30.8* 27.0* 31.1* 34.9*  MCV 85.6 84.4 87.9 87.3  PLT 244 214 254 330   Cardiac Enzymes: Recent Labs  Lab 08/05/23 1621  CKTOTAL 20*   BNP: Invalid input(s): POCBNP CBG: Recent Labs  Lab 08/09/23 1122 08/09/23 1643 08/09/23 1959 08/09/23 2200 08/10/23 0755  GLUCAP 119* 108* 126* 121* 92   D-Dimer No results for input(s): DDIMER in the last 72 hours. Hgb A1c No results for input(s): HGBA1C in the last 72 hours. Lipid Profile No results for input(s): CHOL, HDL, LDLCALC, TRIG, CHOLHDL, LDLDIRECT in the last 72 hours. Thyroid  function studies No results for input(s): TSH, T4TOTAL, T3FREE, THYROIDAB in the last 72 hours.  Invalid input(s): FREET3 Anemia work up No results for input(s): VITAMINB12, FOLATE, FERRITIN, TIBC, IRON, RETICCTPCT in the last 72 hours. Urinalysis    Component Value Date/Time   COLORURINE YELLOW 08/05/2023 1618   APPEARANCEUR CLEAR 08/05/2023 1618   LABSPEC 1.020 08/05/2023 1618   PHURINE 6.0 08/05/2023 1618   GLUCOSEU >=500 (A) 08/05/2023 1618   HGBUR NEGATIVE 08/05/2023 1618   BILIRUBINUR NEGATIVE 08/05/2023 1618   KETONESUR NEGATIVE 08/05/2023 1618  PROTEINUR NEGATIVE 08/05/2023 1618   UROBILINOGEN 0.2 05/14/2012 0030   NITRITE NEGATIVE 08/05/2023 1618    LEUKOCYTESUR NEGATIVE 08/05/2023 1618   Sepsis Labs Recent Labs  Lab 08/05/23 1621 08/06/23 0457 08/07/23 1022 08/10/23 0945  WBC 12.4* 11.0* 14.1* 6.5   Microbiology Recent Results (from the past 240 hours)  SARS Coronavirus 2 by RT PCR (hospital order, performed in [facility] hospital lab) *cepheid single result test* Anterior Nasal Swab     Status: None   Collection Time: 08/05/23  7:32 PM   Specimen: Anterior Nasal Swab  Result Value Ref Range Status   SARS Coronavirus 2 by RT PCR NEGATIVE NEGATIVE Final    Comment: (NOTE) SARS-CoV-2 target nucleic acids are NOT DETECTED.  The SARS-CoV-2 RNA is generally detectable in upper and lower respiratory specimens during the acute phase of infection. The lowest concentration of SARS-CoV-2 viral copies this assay can detect is 250 copies / mL. A negative result does not preclude SARS-CoV-2 infection and should not be used as the sole basis for treatment or other patient management decisions.  A negative result may occur with improper specimen collection / handling, submission of specimen other than nasopharyngeal swab, presence of viral mutation(s) within the areas targeted by this assay, and inadequate number of viral copies (<250 copies / mL). A negative result must be combined with clinical observations, patient history, and epidemiological information.  Fact Sheet for Patients:   RoadLapTop.co.za  Fact Sheet for Healthcare Providers: http://kim-miller.com/  This test is not yet approved or  cleared by the [place]  FDA and has been authorized for detection and/or diagnosis of SARS-CoV-2 by FDA under an Emergency Use Authorization (EUA).  This EUA will remain in effect (meaning this test can be used) for the duration of the COVID-19 declaration under Section 564(b)(1) of the Act, 21 U.S.C. section 360bbb-3(b)(1), unless the authorization is terminated or revoked  sooner.  Performed at [facility], [address], [place], [place] [address]   Culture, blood (x 2)     Status: None (Preliminary result)   Collection Time: 08/05/23  9:26 PM   Specimen: BLOOD  Result Value Ref Range Status   Specimen Description   Final    BLOOD BLOOD RIGHT ARM Performed at [facility], [address], [place], [place] [address]    Special Requests   Final    BOTTLES DRAWN AEROBIC ONLY Blood Culture results may not be optimal due to an inadequate volume of blood received in culture bottles Performed at [facility], [address], [place], [place] [address]    Culture   Final    NO GROWTH 4 DAYS Performed at [facility], [address] [place], [place] [address]    Report Status PENDING  Incomplete  Culture, blood (x 2)     Status: None (Preliminary result)   Collection Time: 08/05/23  9:30 PM   Specimen: BLOOD  Result Value Ref Range Status   Specimen Description   Final    BLOOD BLOOD RIGHT HAND Performed at [facility], [address] [place], [place] [address]    Special Requests   Final    BOTTLES DRAWN AEROBIC ONLY Blood Culture results may not be optimal due to an inadequate volume of blood received in culture bottles Performed at [facility], [address] [place], [place] [address]    Culture   Final    NO GROWTH 4 DAYS Performed at  [facility], [address], [place], [place] [address]    Report Status PENDING  Incomplete    Please note: You were cared for by a hospitalist during your hospital stay. Once you are discharged, your primary care physician will handle any further medical issues. Please note that NO REFILLS for any discharge medications will be authorized once you are discharged, as it is imperative that you return to your primary care physician (or establish a relationship with a primary care physician if you do not  have one) for your post hospital discharge needs so that they can reassess your need for medications and monitor your lab values.    Time coordinating discharge: 40 minutes  SIGNED:   Ivonne Mustache, MD  [facility] 08/10/2023, 11:40 AM Pager [phone]  If 7PM-7AM, please contact night-coverage [email or web address] Password [email or web address]

## 2023-08-11 LAB — CULTURE, BLOOD (ROUTINE X 2)
Culture: NO GROWTH
Culture: NO GROWTH

## 2023-08-16 DIAGNOSIS — J189 Pneumonia, unspecified organism: Secondary | ICD-10-CM | POA: Diagnosis not present

## 2023-08-16 DIAGNOSIS — G9340 Encephalopathy, unspecified: Secondary | ICD-10-CM | POA: Diagnosis not present

## 2023-08-16 DIAGNOSIS — R531 Weakness: Secondary | ICD-10-CM | POA: Diagnosis not present

## 2023-08-16 DIAGNOSIS — E46 Unspecified protein-calorie malnutrition: Secondary | ICD-10-CM | POA: Diagnosis not present

## 2023-08-28 DIAGNOSIS — F05 Delirium due to known physiological condition: Secondary | ICD-10-CM | POA: Diagnosis not present

## 2023-08-28 DIAGNOSIS — F4322 Adjustment disorder with anxiety: Secondary | ICD-10-CM | POA: Diagnosis not present

## 2023-09-02 DIAGNOSIS — G3184 Mild cognitive impairment, so stated: Secondary | ICD-10-CM | POA: Diagnosis not present

## 2023-09-02 DIAGNOSIS — F4322 Adjustment disorder with anxiety: Secondary | ICD-10-CM | POA: Diagnosis not present

## 2023-09-05 DIAGNOSIS — M25561 Pain in right knee: Secondary | ICD-10-CM | POA: Diagnosis not present

## 2023-09-05 DIAGNOSIS — M25512 Pain in left shoulder: Secondary | ICD-10-CM | POA: Diagnosis not present

## 2023-09-07 DIAGNOSIS — J189 Pneumonia, unspecified organism: Secondary | ICD-10-CM | POA: Diagnosis not present

## 2023-09-07 DIAGNOSIS — D509 Iron deficiency anemia, unspecified: Secondary | ICD-10-CM | POA: Diagnosis not present

## 2023-09-07 DIAGNOSIS — E119 Type 2 diabetes mellitus without complications: Secondary | ICD-10-CM | POA: Diagnosis not present

## 2023-09-07 DIAGNOSIS — E8809 Other disorders of plasma-protein metabolism, not elsewhere classified: Secondary | ICD-10-CM | POA: Diagnosis not present

## 2023-09-07 DIAGNOSIS — Z7982 Long term (current) use of aspirin: Secondary | ICD-10-CM | POA: Diagnosis not present

## 2023-09-07 DIAGNOSIS — K219 Gastro-esophageal reflux disease without esophagitis: Secondary | ICD-10-CM | POA: Diagnosis not present

## 2023-09-07 DIAGNOSIS — N400 Enlarged prostate without lower urinary tract symptoms: Secondary | ICD-10-CM | POA: Diagnosis not present

## 2023-09-07 DIAGNOSIS — R918 Other nonspecific abnormal finding of lung field: Secondary | ICD-10-CM | POA: Diagnosis not present

## 2023-09-07 DIAGNOSIS — Z9181 History of falling: Secondary | ICD-10-CM | POA: Diagnosis not present

## 2023-09-07 DIAGNOSIS — J323 Chronic sphenoidal sinusitis: Secondary | ICD-10-CM | POA: Diagnosis not present

## 2023-09-07 DIAGNOSIS — S42352D Displaced comminuted fracture of shaft of humerus, left arm, subsequent encounter for fracture with routine healing: Secondary | ICD-10-CM | POA: Diagnosis not present

## 2023-09-07 DIAGNOSIS — M1711 Unilateral primary osteoarthritis, right knee: Secondary | ICD-10-CM | POA: Diagnosis not present

## 2023-09-07 DIAGNOSIS — I10 Essential (primary) hypertension: Secondary | ICD-10-CM | POA: Diagnosis not present

## 2023-09-07 DIAGNOSIS — E43 Unspecified severe protein-calorie malnutrition: Secondary | ICD-10-CM | POA: Diagnosis not present

## 2023-09-07 DIAGNOSIS — S82001D Unspecified fracture of right patella, subsequent encounter for closed fracture with routine healing: Secondary | ICD-10-CM | POA: Diagnosis not present

## 2023-09-07 DIAGNOSIS — K2270 Barrett's esophagus without dysplasia: Secondary | ICD-10-CM | POA: Diagnosis not present

## 2023-09-07 DIAGNOSIS — Z7984 Long term (current) use of oral hypoglycemic drugs: Secondary | ICD-10-CM | POA: Diagnosis not present

## 2023-09-07 DIAGNOSIS — M11261 Other chondrocalcinosis, right knee: Secondary | ICD-10-CM | POA: Diagnosis not present

## 2023-09-16 ENCOUNTER — Other Ambulatory Visit: Payer: Self-pay

## 2023-09-16 ENCOUNTER — Other Ambulatory Visit (HOSPITAL_BASED_OUTPATIENT_CLINIC_OR_DEPARTMENT_OTHER): Payer: Self-pay

## 2023-09-16 ENCOUNTER — Other Ambulatory Visit (HOSPITAL_COMMUNITY): Payer: Self-pay

## 2023-09-16 MED ORDER — CREON 3000-9500 UNITS PO CPEP
2.0000 | ORAL_CAPSULE | ORAL | 2 refills | Status: DC
  Filled 2023-09-16: qty 140, 23d supply, fill #0
  Filled 2023-10-15: qty 140, 23d supply, fill #1
  Filled 2023-11-04: qty 140, 23d supply, fill #2

## 2023-09-17 ENCOUNTER — Other Ambulatory Visit: Payer: Self-pay

## 2023-09-18 ENCOUNTER — Other Ambulatory Visit: Payer: Self-pay

## 2023-09-18 DIAGNOSIS — R5382 Chronic fatigue, unspecified: Secondary | ICD-10-CM | POA: Diagnosis not present

## 2023-09-18 DIAGNOSIS — D692 Other nonthrombocytopenic purpura: Secondary | ICD-10-CM | POA: Diagnosis not present

## 2023-09-18 DIAGNOSIS — K869 Disease of pancreas, unspecified: Secondary | ICD-10-CM | POA: Diagnosis not present

## 2023-09-18 DIAGNOSIS — E1169 Type 2 diabetes mellitus with other specified complication: Secondary | ICD-10-CM | POA: Diagnosis not present

## 2023-09-18 DIAGNOSIS — J189 Pneumonia, unspecified organism: Secondary | ICD-10-CM | POA: Diagnosis not present

## 2023-09-18 DIAGNOSIS — R627 Adult failure to thrive: Secondary | ICD-10-CM | POA: Diagnosis not present

## 2023-09-18 DIAGNOSIS — R5381 Other malaise: Secondary | ICD-10-CM | POA: Diagnosis not present

## 2023-09-23 DIAGNOSIS — R918 Other nonspecific abnormal finding of lung field: Secondary | ICD-10-CM | POA: Diagnosis not present

## 2023-09-23 DIAGNOSIS — S82001D Unspecified fracture of right patella, subsequent encounter for closed fracture with routine healing: Secondary | ICD-10-CM | POA: Diagnosis not present

## 2023-09-23 DIAGNOSIS — E43 Unspecified severe protein-calorie malnutrition: Secondary | ICD-10-CM | POA: Diagnosis not present

## 2023-09-23 DIAGNOSIS — E119 Type 2 diabetes mellitus without complications: Secondary | ICD-10-CM | POA: Diagnosis not present

## 2023-09-23 DIAGNOSIS — S42352D Displaced comminuted fracture of shaft of humerus, left arm, subsequent encounter for fracture with routine healing: Secondary | ICD-10-CM | POA: Diagnosis not present

## 2023-09-23 DIAGNOSIS — K2270 Barrett's esophagus without dysplasia: Secondary | ICD-10-CM | POA: Diagnosis not present

## 2023-09-23 DIAGNOSIS — D509 Iron deficiency anemia, unspecified: Secondary | ICD-10-CM | POA: Diagnosis not present

## 2023-09-23 DIAGNOSIS — K219 Gastro-esophageal reflux disease without esophagitis: Secondary | ICD-10-CM | POA: Diagnosis not present

## 2023-09-23 DIAGNOSIS — I10 Essential (primary) hypertension: Secondary | ICD-10-CM | POA: Diagnosis not present

## 2023-09-23 DIAGNOSIS — J189 Pneumonia, unspecified organism: Secondary | ICD-10-CM | POA: Diagnosis not present

## 2023-09-23 DIAGNOSIS — M11261 Other chondrocalcinosis, right knee: Secondary | ICD-10-CM | POA: Diagnosis not present

## 2023-09-23 DIAGNOSIS — M1711 Unilateral primary osteoarthritis, right knee: Secondary | ICD-10-CM | POA: Diagnosis not present

## 2023-10-07 DIAGNOSIS — R5382 Chronic fatigue, unspecified: Secondary | ICD-10-CM | POA: Diagnosis not present

## 2023-10-07 DIAGNOSIS — R600 Localized edema: Secondary | ICD-10-CM | POA: Diagnosis not present

## 2023-10-07 DIAGNOSIS — R627 Adult failure to thrive: Secondary | ICD-10-CM | POA: Diagnosis not present

## 2023-10-07 DIAGNOSIS — E1169 Type 2 diabetes mellitus with other specified complication: Secondary | ICD-10-CM | POA: Diagnosis not present

## 2023-10-07 DIAGNOSIS — E876 Hypokalemia: Secondary | ICD-10-CM | POA: Diagnosis not present

## 2023-10-07 DIAGNOSIS — R5381 Other malaise: Secondary | ICD-10-CM | POA: Diagnosis not present

## 2023-10-14 DIAGNOSIS — R600 Localized edema: Secondary | ICD-10-CM | POA: Diagnosis not present

## 2023-10-14 DIAGNOSIS — M25512 Pain in left shoulder: Secondary | ICD-10-CM | POA: Diagnosis not present

## 2023-10-14 DIAGNOSIS — E876 Hypokalemia: Secondary | ICD-10-CM | POA: Diagnosis not present

## 2023-10-14 DIAGNOSIS — R5382 Chronic fatigue, unspecified: Secondary | ICD-10-CM | POA: Diagnosis not present

## 2023-10-14 DIAGNOSIS — R5381 Other malaise: Secondary | ICD-10-CM | POA: Diagnosis not present

## 2023-10-14 DIAGNOSIS — M25561 Pain in right knee: Secondary | ICD-10-CM | POA: Diagnosis not present

## 2023-10-14 DIAGNOSIS — R627 Adult failure to thrive: Secondary | ICD-10-CM | POA: Diagnosis not present

## 2023-10-15 ENCOUNTER — Other Ambulatory Visit: Payer: Self-pay

## 2023-10-15 ENCOUNTER — Other Ambulatory Visit (HOSPITAL_COMMUNITY): Payer: Self-pay

## 2023-10-21 DIAGNOSIS — R600 Localized edema: Secondary | ICD-10-CM | POA: Diagnosis not present

## 2023-10-21 DIAGNOSIS — R5382 Chronic fatigue, unspecified: Secondary | ICD-10-CM | POA: Diagnosis not present

## 2023-10-21 DIAGNOSIS — E876 Hypokalemia: Secondary | ICD-10-CM | POA: Diagnosis not present

## 2023-10-21 DIAGNOSIS — R5381 Other malaise: Secondary | ICD-10-CM | POA: Diagnosis not present

## 2023-10-21 DIAGNOSIS — R627 Adult failure to thrive: Secondary | ICD-10-CM | POA: Diagnosis not present

## 2023-10-23 DIAGNOSIS — R2689 Other abnormalities of gait and mobility: Secondary | ICD-10-CM | POA: Diagnosis not present

## 2023-11-04 ENCOUNTER — Encounter: Payer: Self-pay | Admitting: Radiology

## 2023-11-04 ENCOUNTER — Other Ambulatory Visit (HOSPITAL_COMMUNITY): Payer: Self-pay

## 2023-11-04 DIAGNOSIS — R2689 Other abnormalities of gait and mobility: Secondary | ICD-10-CM | POA: Diagnosis not present

## 2023-11-07 DIAGNOSIS — R2689 Other abnormalities of gait and mobility: Secondary | ICD-10-CM | POA: Diagnosis not present

## 2023-11-11 ENCOUNTER — Other Ambulatory Visit (HOSPITAL_COMMUNITY): Payer: Self-pay

## 2023-11-12 ENCOUNTER — Other Ambulatory Visit: Payer: Self-pay

## 2023-11-12 ENCOUNTER — Emergency Department (HOSPITAL_COMMUNITY)

## 2023-11-12 ENCOUNTER — Inpatient Hospital Stay (HOSPITAL_COMMUNITY)
Admission: EM | Admit: 2023-11-12 | Discharge: 2023-11-16 | DRG: 948 | Disposition: A | Discharge status: Hospice | Source: Ambulatory Visit

## 2023-11-12 ENCOUNTER — Encounter (HOSPITAL_COMMUNITY): Payer: Self-pay

## 2023-11-12 DIAGNOSIS — Z515 Encounter for palliative care: Secondary | ICD-10-CM

## 2023-11-12 DIAGNOSIS — E8809 Other disorders of plasma-protein metabolism, not elsewhere classified: Secondary | ICD-10-CM | POA: Diagnosis present

## 2023-11-12 DIAGNOSIS — S42302A Unspecified fracture of shaft of humerus, left arm, initial encounter for closed fracture: Secondary | ICD-10-CM | POA: Diagnosis not present

## 2023-11-12 DIAGNOSIS — J9811 Atelectasis: Secondary | ICD-10-CM | POA: Diagnosis not present

## 2023-11-12 DIAGNOSIS — N401 Enlarged prostate with lower urinary tract symptoms: Secondary | ICD-10-CM | POA: Diagnosis present

## 2023-11-12 DIAGNOSIS — R64 Cachexia: Secondary | ICD-10-CM | POA: Diagnosis present

## 2023-11-12 DIAGNOSIS — R7401 Elevation of levels of liver transaminase levels: Secondary | ICD-10-CM | POA: Diagnosis not present

## 2023-11-12 DIAGNOSIS — Z87442 Personal history of urinary calculi: Secondary | ICD-10-CM

## 2023-11-12 DIAGNOSIS — Z888 Allergy status to other drugs, medicaments and biological substances status: Secondary | ICD-10-CM

## 2023-11-12 DIAGNOSIS — I509 Heart failure, unspecified: Secondary | ICD-10-CM | POA: Diagnosis not present

## 2023-11-12 DIAGNOSIS — Z7984 Long term (current) use of oral hypoglycemic drugs: Secondary | ICD-10-CM

## 2023-11-12 DIAGNOSIS — T501X6A Underdosing of loop [high-ceiling] diuretics, initial encounter: Secondary | ICD-10-CM | POA: Diagnosis present

## 2023-11-12 DIAGNOSIS — M7989 Other specified soft tissue disorders: Secondary | ICD-10-CM | POA: Diagnosis not present

## 2023-11-12 DIAGNOSIS — N5089 Other specified disorders of the male genital organs: Secondary | ICD-10-CM | POA: Diagnosis not present

## 2023-11-12 DIAGNOSIS — E8770 Fluid overload, unspecified: Secondary | ICD-10-CM | POA: Diagnosis not present

## 2023-11-12 DIAGNOSIS — E876 Hypokalemia: Secondary | ICD-10-CM | POA: Diagnosis present

## 2023-11-12 DIAGNOSIS — Z87891 Personal history of nicotine dependence: Secondary | ICD-10-CM

## 2023-11-12 DIAGNOSIS — Z833 Family history of diabetes mellitus: Secondary | ICD-10-CM

## 2023-11-12 DIAGNOSIS — K219 Gastro-esophageal reflux disease without esophagitis: Secondary | ICD-10-CM | POA: Diagnosis present

## 2023-11-12 DIAGNOSIS — J45909 Unspecified asthma, uncomplicated: Secondary | ICD-10-CM | POA: Diagnosis present

## 2023-11-12 DIAGNOSIS — R918 Other nonspecific abnormal finding of lung field: Secondary | ICD-10-CM | POA: Diagnosis not present

## 2023-11-12 DIAGNOSIS — N400 Enlarged prostate without lower urinary tract symptoms: Secondary | ICD-10-CM | POA: Diagnosis present

## 2023-11-12 DIAGNOSIS — R5381 Other malaise: Secondary | ICD-10-CM | POA: Diagnosis present

## 2023-11-12 DIAGNOSIS — R601 Generalized edema: Secondary | ICD-10-CM | POA: Diagnosis not present

## 2023-11-12 DIAGNOSIS — I517 Cardiomegaly: Secondary | ICD-10-CM | POA: Diagnosis not present

## 2023-11-12 DIAGNOSIS — W19XXXA Unspecified fall, initial encounter: Secondary | ICD-10-CM | POA: Diagnosis present

## 2023-11-12 DIAGNOSIS — F03918 Unspecified dementia, unspecified severity, with other behavioral disturbance: Secondary | ICD-10-CM | POA: Diagnosis present

## 2023-11-12 DIAGNOSIS — R339 Retention of urine, unspecified: Secondary | ICD-10-CM | POA: Diagnosis not present

## 2023-11-12 DIAGNOSIS — Z7982 Long term (current) use of aspirin: Secondary | ICD-10-CM

## 2023-11-12 DIAGNOSIS — R059 Cough, unspecified: Secondary | ICD-10-CM | POA: Diagnosis not present

## 2023-11-12 DIAGNOSIS — Z79890 Hormone replacement therapy (postmenopausal): Secondary | ICD-10-CM

## 2023-11-12 DIAGNOSIS — E871 Hypo-osmolality and hyponatremia: Secondary | ICD-10-CM | POA: Diagnosis present

## 2023-11-12 DIAGNOSIS — R939 Diagnostic imaging inconclusive due to excess body fat of patient: Secondary | ICD-10-CM | POA: Diagnosis not present

## 2023-11-12 DIAGNOSIS — Z860101 Personal history of adenomatous and serrated colon polyps: Secondary | ICD-10-CM

## 2023-11-12 DIAGNOSIS — J40 Bronchitis, not specified as acute or chronic: Secondary | ICD-10-CM | POA: Diagnosis not present

## 2023-11-12 DIAGNOSIS — E119 Type 2 diabetes mellitus without complications: Secondary | ICD-10-CM | POA: Diagnosis present

## 2023-11-12 DIAGNOSIS — Z9049 Acquired absence of other specified parts of digestive tract: Secondary | ICD-10-CM

## 2023-11-12 DIAGNOSIS — R1110 Vomiting, unspecified: Secondary | ICD-10-CM | POA: Diagnosis not present

## 2023-11-12 DIAGNOSIS — K7460 Unspecified cirrhosis of liver: Secondary | ICD-10-CM | POA: Diagnosis present

## 2023-11-12 DIAGNOSIS — K760 Fatty (change of) liver, not elsewhere classified: Secondary | ICD-10-CM | POA: Diagnosis present

## 2023-11-12 DIAGNOSIS — Z66 Do not resuscitate: Secondary | ICD-10-CM | POA: Diagnosis present

## 2023-11-12 DIAGNOSIS — Z6823 Body mass index (BMI) 23.0-23.9, adult: Secondary | ICD-10-CM

## 2023-11-12 DIAGNOSIS — I10 Essential (primary) hypertension: Secondary | ICD-10-CM | POA: Diagnosis present

## 2023-11-12 DIAGNOSIS — R000 Tachycardia, unspecified: Secondary | ICD-10-CM | POA: Diagnosis not present

## 2023-11-12 DIAGNOSIS — Z91148 Patient's other noncompliance with medication regimen for other reason: Secondary | ICD-10-CM

## 2023-11-12 DIAGNOSIS — E785 Hyperlipidemia, unspecified: Secondary | ICD-10-CM | POA: Diagnosis present

## 2023-11-12 LAB — BASIC METABOLIC PANEL WITH GFR
Anion gap: 6 (ref 5–15)
BUN: 19 mg/dL (ref 8–23)
CO2: 25 mmol/L (ref 22–32)
Calcium: 7.3 mg/dL — ABNORMAL LOW (ref 8.9–10.3)
Chloride: 104 mmol/L (ref 98–111)
Creatinine, Ser: 1.02 mg/dL (ref 0.61–1.24)
GFR, Estimated: >60 mL/min (ref >60)
Glucose, Bld: 126 mg/dL — ABNORMAL HIGH (ref 70–99)
Potassium: 3.4 mmol/L — ABNORMAL LOW (ref 3.5–5.1)
Sodium: 135 mmol/L (ref 135–145)

## 2023-11-12 LAB — HEPATIC FUNCTION PANEL
ALT: 28 U/L (ref 0–44)
AST: 47 U/L — ABNORMAL HIGH (ref 15–41)
Albumin: 1.8 g/dL — ABNORMAL LOW (ref 3.5–5.0)
Alkaline Phosphatase: 219 U/L — ABNORMAL HIGH (ref 38–126)
Bilirubin, Direct: 0.6 mg/dL — ABNORMAL HIGH (ref 0.0–0.2)
Indirect Bilirubin: 0.4 mg/dL (ref 0.3–0.9)
Total Bilirubin: 1 mg/dL (ref 0.0–1.2)
Total Protein: 4.7 g/dL — ABNORMAL LOW (ref 6.5–8.1)

## 2023-11-12 LAB — GLUCOSE, CAPILLARY
Glucose-Capillary: 112 mg/dL — ABNORMAL HIGH (ref 70–99)
Glucose-Capillary: 123 mg/dL — ABNORMAL HIGH (ref 70–99)

## 2023-11-12 LAB — CBC
HCT: 39.9 % (ref 39.0–52.0)
Hemoglobin: 12.6 g/dL — ABNORMAL LOW (ref 13.0–17.0)
MCH: 30.4 pg (ref 26.0–34.0)
MCHC: 31.6 g/dL (ref 30.0–36.0)
MCV: 96.1 fL (ref 80.0–100.0)
Platelets: 51 K/uL — ABNORMAL LOW (ref 150–400)
RBC: 4.15 MIL/uL — ABNORMAL LOW (ref 4.22–5.81)
RDW: 17.8 % — ABNORMAL HIGH (ref 11.5–15.5)
WBC: 5.9 K/uL (ref 4.0–10.5)
nRBC: 0 % (ref 0.0–0.2)

## 2023-11-12 LAB — RESP PANEL BY RT-PCR (RSV, FLU A&B, COVID)  RVPGX2
Influenza A by PCR: NEGATIVE
Influenza B by PCR: NEGATIVE
Resp Syncytial Virus by PCR: NEGATIVE
SARS Coronavirus 2 by RT PCR: NEGATIVE

## 2023-11-12 LAB — PRO BRAIN NATRIURETIC PEPTIDE: Pro Brain Natriuretic Peptide: 217 pg/mL (ref <300.0)

## 2023-11-12 LAB — MAGNESIUM: Magnesium: 1.8 mg/dL (ref 1.7–2.4)

## 2023-11-12 MED ORDER — RISPERIDONE 0.5 MG PO TABS: 0.5000 mg | ORAL_TABLET | Freq: Every evening | ORAL | Status: DC | PRN

## 2023-11-12 MED ORDER — SODIUM CHLORIDE 0.9 % IV SOLN: 250.0000 mL | INTRAVENOUS | Status: AC | PRN

## 2023-11-12 MED ORDER — ONDANSETRON HCL 4 MG PO TABS: 4.0000 mg | ORAL_TABLET | Freq: Four times a day (QID) | ORAL | Status: DC | PRN

## 2023-11-12 MED ORDER — RISPERIDONE 1 MG PO TABS
0.5000 mg | ORAL_TABLET | Freq: Two times a day (BID) | ORAL | Status: DC
  Administered 2023-11-12 – 2023-11-16 (×7): 0.5 mg via ORAL
  Filled 2023-11-12 (×8): qty 1

## 2023-11-12 MED ORDER — ENOXAPARIN SODIUM 40 MG/0.4ML IJ SOSY
40.0000 mg | PREFILLED_SYRINGE | INTRAMUSCULAR | Status: DC
  Administered 2023-11-12 – 2023-11-15 (×4): 40 mg via SUBCUTANEOUS
  Filled 2023-11-12 (×4): qty 0.4

## 2023-11-12 MED ORDER — INSULIN ASPART 100 UNIT/ML IJ SOLN
0.0000 [IU] | Freq: Three times a day (TID) | INTRAMUSCULAR | Status: DC
  Administered 2023-11-14: 1 [IU] via SUBCUTANEOUS
  Filled 2023-11-12: qty 1

## 2023-11-12 MED ORDER — POTASSIUM CHLORIDE CRYS ER 20 MEQ PO TBCR: 40.0000 meq | EXTENDED_RELEASE_TABLET | Freq: Two times a day (BID) | ORAL | Status: DC

## 2023-11-12 MED ORDER — FUROSEMIDE 10 MG/ML IJ SOLN
20.0000 mg | Freq: Two times a day (BID) | INTRAMUSCULAR | Status: DC
  Administered 2023-11-12 – 2023-11-15 (×6): 20 mg via INTRAVENOUS
  Filled 2023-11-12 (×7): qty 2

## 2023-11-12 MED ORDER — ACETAMINOPHEN 325 MG PO TABS: 650.0000 mg | ORAL_TABLET | Freq: Four times a day (QID) | ORAL | Status: DC | PRN

## 2023-11-12 MED ORDER — POTASSIUM CHLORIDE CRYS ER 20 MEQ PO TBCR
40.0000 meq | EXTENDED_RELEASE_TABLET | Freq: Once | ORAL | Status: AC
  Administered 2023-11-12: 40 meq via ORAL
  Filled 2023-11-12: qty 2

## 2023-11-12 MED ORDER — SODIUM CHLORIDE 0.9% FLUSH: 3.0000 mL | INTRAVENOUS | Status: DC | PRN

## 2023-11-12 MED ORDER — SODIUM CHLORIDE 0.9% FLUSH
3.0000 mL | Freq: Two times a day (BID) | INTRAVENOUS | Status: DC
  Administered 2023-11-12 – 2023-11-16 (×9): 3 mL via INTRAVENOUS

## 2023-11-12 MED ORDER — ONDANSETRON HCL 4 MG/2ML IJ SOLN
4.0000 mg | Freq: Four times a day (QID) | INTRAMUSCULAR | Status: DC | PRN
  Administered 2023-11-15 – 2023-11-16 (×2): 4 mg via INTRAVENOUS
  Filled 2023-11-12 (×2): qty 2

## 2023-11-12 MED ORDER — FUROSEMIDE 10 MG/ML IJ SOLN
20.0000 mg | Freq: Once | INTRAMUSCULAR | Status: AC
  Administered 2023-11-12: 20 mg via INTRAVENOUS
  Filled 2023-11-12: qty 4

## 2023-11-12 MED ORDER — ACETAMINOPHEN 650 MG RE SUPP: 650.0000 mg | Freq: Four times a day (QID) | RECTAL | Status: DC | PRN

## 2023-11-12 NOTE — Assessment & Plan Note (Signed)
 Blood sugar in 120s  SSI  A1C Monitor

## 2023-11-12 NOTE — Assessment & Plan Note (Signed)
 Progressive worsening functional status at home s/p left upper extremity fracture/right patellar fracture 06/2023  Pt minimally ambulating at home  Family has HHRN at home, though with still worsening functional status  Broached issue of increased care  PT/OT eval  Fall precautions  Follow

## 2023-11-12 NOTE — Assessment & Plan Note (Signed)
 On finasteride

## 2023-11-12 NOTE — H&P (Signed)
 History and Physical    Patient: Francisco [name] FMW:[id number] DOB: 17-Apr-1933 DOA: 11/12/2023 DOS: the patient was seen and examined on 11/12/2023 PCP: Shepard Ade, MD  Patient coming from: Home  Chief Complaint:  Chief Complaint  Patient presents with   Leg Swelling    Abdominal distention     HPI: Francisco [name] is a 89 y.o. male with medical history significant of asthma, BPH, t2DM, GERD, HLD presenting with volume overload. Family reports patient went to urology appointment today when patient was noted to have significant 3+ pitting edema with intermittent SOB by urologist. Pt was subsequently sent to the ER for evaluation. Family reports patient w/ an extended history of chronic LE edema. Pt was prescribed lasix . However, he has been relatively non compliant with medication regimen. Pt with noted major fall with  left upper extremity fracture/right patellar fracture 06/2023. Family states that he stopped taking the medication because medication made him excessively urinate. MIld orthopnea and PND. Family/pt report no know history or diagnosis of CHF in the past. Pt has been taking intermittent doses of NSAIDs for pain. Also admits to high salt diet. No CP or SOB. No nausea or vomiting.  Presented to the ER afebrile, hemodynamically stable. Satting well on RA. CBC pending. Cr 1.02, glucose 126, K 3.4.  Pro BNP 217. CXR with bibasilar atelectasis and small R pleural effusion.  Review of Systems: As mentioned in the history of present illness. All other systems reviewed and are negative. Past Medical History:  Diagnosis Date   Arthritis    Asthma with allergic rhinitis    BPH (benign prostatic hyperplasia)    DM (diabetes mellitus) (HCC)    Elevated CEA    Elevated prostate specific antigen (PSA)    Fatty liver    GERD (gastroesophageal reflux disease)    History of kidney stones    Hx of adenomatous colonic polyps 02/14/2016   10 adenomas max 15 mm 02/2016 - no recall at his  age   Hyperlipidemia    Impotence of organic origin    Neoplasm of uncertain behavior of prostate    Pancreatic cyst FOLLOWED BY DR TERESSA   Personal history of urinary calculi    Pneumonia    Renal calculi 09/19/2011   Right ureteral stone    Past Surgical History:  Procedure Laterality Date   CHOLECYSTECTOMY     done with [facility]   COLONOSCOPY W/ POLYPECTOMY  2018   CYSTOSCOPY WITH RETROGRADE PYELOGRAM, URETEROSCOPY AND STENT PLACEMENT Right 08/19/2019   Procedure: CYSTOSCOPY WITH RETROGRADE PYELOGRAM, URETEROSCOPY, LASER LITHOTRIPSY STENT PLACEMENT FIRST STAGE;  Surgeon: Alvaro Hummer, MD;  Location: [facility];  Service: Urology;  Laterality: Right;  90 MINS   CYSTOSCOPY WITH RETROGRADE PYELOGRAM, URETEROSCOPY AND STENT PLACEMENT Right 09/04/2019   Procedure: CYSTOSCOPY WITH RETROGRADE PYELOGRAM, BASKETING OF STONES URETEROSCOPY AND STENT EXCHANGE;  Surgeon: Alvaro Hummer, MD;  Location: [facility];  Service: Urology;  Laterality: Right;  60 MINS   CYSTOSCOPY WITH URETEROSCOPY  03/14/2011   Procedure: CYSTOSCOPY WITH URETEROSCOPY;  Surgeon: Toribio Neysa Repine, MD;  Location: [facility];  Service: Urology;  Laterality: Right;  2 hours requested for this case  Right Ureter Stent Placement  C-ARM CAMERA DIGITAL URETEROSCOPE     EUS  04/12/2011   Procedure: UPPER ENDOSCOPIC ULTRASOUND (EUS) LINEAR;  Surgeon: Toribio SHAUNNA Teressa, MD;  Location: [facility];  Service: Endoscopy;  Laterality: N/A;  radial linear/pt moved up a hour early by AW , Patty @ Teressa  office ok'd & pt was called by AW   HOLMIUM LASER APPLICATION Right 08/19/2019   Procedure: HOLMIUM LASER APPLICATION;  Surgeon: Alvaro Hummer, MD;  Location: [facility];  Service: Urology;  Laterality: Right;   KIDNEY STONE SURGERY  03/2011   PERCUTANEOUS NEPHROSTOLITHOTOMY  1985   WHIPPLE PROCEDURE  12/2011   [facility]   XI ROBOTIC ASSISTED SIMPLE PROSTATECTOMY N/A 06/15/2022   Procedure: XI ROBOTIC ASSISTED SIMPLE  PROSTATECTOMY;  Surgeon: Alvaro Hummer KATHEE Mickey., MD;  Location: [facility];  Service: Urology;  Laterality: N/A;   Social History:  reports that he quit smoking about 32 years ago. His smoking use included cigarettes. He started smoking about 52 years ago. He has never used smokeless tobacco. He reports current alcohol  use. He reports that he does not use drugs.  Allergies  Allergen Reactions   Metformin Hcl Other (See Comments)    GI upset    Family History  Problem Relation Age of Onset   Diabetes Mother    Diabetes Sister    Cancer Brother        unknown type   Stroke Father     Prior to Admission medications   Medication Sig Start Date End Date Taking? Authorizing Provider  aspirin  EC 81 MG tablet Take 81 mg by mouth daily. Swallow whole.    [provider]  docusate sodium  (COLACE) 100 MG capsule Take 1 capsule (100 mg total) by mouth 2 (two) times daily. 06/15/22   [name], PA-C  empagliflozin  (JARDIANCE ) 10 MG TABS tablet Take 1 tablet (10 mg total) by mouth daily. Patient taking differently: Take 5 mg by mouth daily. 01/08/23     esomeprazole (NEXIUM) 40 MG capsule Take 40 mg by mouth 2 (two) times daily.    [provider]  ferrous sulfate  325 (65 FE) MG tablet Take 1 tablet (325 mg total) by mouth daily with breakfast. 08/11/23   Jillian Buttery, MD  finasteride  (PROSCAR ) 5 MG tablet Take 1 tablet (5 mg total) by mouth daily. 12/24/22     furosemide  (LASIX ) 20 MG tablet Take 1 tablet (20 mg total) by mouth daily as needed for leg swelling 01/17/22   Shepard Ade, MD  glucose blood (ACCU-CHEK GUIDE TEST) test strip Use 1 strip as directed to test blood sugar twice daily 07/24/23     glucose blood (ACCU-CHEK GUIDE) test strip Use twice daily as directed 04/18/22     HYDROcodone -acetaminophen  (NORCO/VICODIN) 5-325 MG tablet Take 1 tablet by mouth every 6 (six) hours as needed for moderate pain (pain score 4-6). 08/10/23   Jillian Buttery, MD  loperamide  (IMODIUM   A-D) 2 MG tablet Take 4 mg by mouth 2 (two) times daily.    [provider]  Multiple Vitamin (MULTIVITAMIN WITH MINERALS) TABS tablet Take 1 tablet by mouth daily. 08/11/23   Jillian Buttery, MD  Pancrelipase , Lip-Prot-Amyl, (CREON ) 3000-9500 units CPEP Take 2 capsules (6,000 Units total) by mouth prior to breakfast, lunch, and supper. 09/16/23     polyethylene glycol (MIRALAX  / GLYCOLAX ) 17 g packet Take 17 g by mouth daily. 08/11/23   Jillian Buttery, MD  Probiotic Product (ALIGN) 4 MG CAPS Take 4 mg by mouth daily.     [provider]  thiamine  (VITAMIN B-1) 100 MG tablet Take 1 tablet (100 mg total) by mouth daily. 08/11/23   Jillian Buttery, MD  triamcinolone  cream (KENALOG ) 0.1 % Apply 1 Application topically 3 (three) times daily as needed for swelling 09/06/22  Physical Exam: Vitals:   11/12/23 1430 11/12/23 1452 11/12/23 1500 11/12/23 1530  BP:  99/61    Pulse: (!) 51 88 88 (!) 30  Resp: 16 17 17 14   Temp:  98 F (36.7 C)    TempSrc:  Oral    SpO2: (!) 89% 99% 97% (!) 87%   Physical Exam Constitutional:      Comments: Underweighht  Cachectic    HENT:     Head: Normocephalic and atraumatic.     Nose: Nose normal.     Mouth/Throat:     Mouth: Mucous membranes are moist.  Eyes:     Pupils: Pupils are equal, round, and reactive to light.  Cardiovascular:     Rate and Rhythm: Normal rate and regular rhythm.  Pulmonary:     Effort: Pulmonary effort is normal.  Abdominal:     General: Bowel sounds are normal.  Musculoskeletal:     Cervical back: Normal range of motion.     Right lower leg: Edema present.     Left lower leg: Edema present.  Skin:    General: Skin is warm.  Neurological:     General: No focal deficit present.  Psychiatric:        Mood and Affect: Mood normal.     Data Reviewed:  There are no new results to review at this time.  DG Chest 2 View CLINICAL DATA:  Volume overload.  Lower extremity swelling.  EXAM: CHEST - 2  VIEW  COMPARISON:  08/05/2023  FINDINGS: Decreased lung volumes are seen since prior exam. Mild bibasilar atelectasis noted. Probable small right pleural effusion. No evidence of pulmonary edema or consolidation.  IMPRESSION: Decreased lung volumes, with mild bibasilar atelectasis and probable small right pleural effusion.  Electronically Signed   By: Norleen DELENA Kil M.D.   On: 11/12/2023 12:21  Lab Results  Component Value Date   WBC 6.5 08/10/2023   HGB 10.7 (L) 08/10/2023   HCT 34.9 (L) 08/10/2023   MCV 87.3 08/10/2023   PLT 330 08/10/2023   Last metabolic panel Lab Results  Component Value Date   GLUCOSE 126 (H) 11/12/2023   NA 135 11/12/2023   K 3.4 (L) 11/12/2023   CL 104 11/12/2023   CO2 25 11/12/2023   BUN 19 11/12/2023   CREATININE 1.02 11/12/2023   GFRNONAA >60 11/12/2023   CALCIUM  7.3 (L) 11/12/2023   PHOS 2.9 08/06/2023   PROT 5.1 (L) 08/06/2023   ALBUMIN  1.8 (L) 08/06/2023   BILITOT 0.9 08/06/2023   ALKPHOS 147 (H) 08/06/2023   AST 20 08/06/2023   ALT 15 08/06/2023   ANIONGAP 6 11/12/2023    Assessment and Plan: * Anasarca Progressive worsening lower extremity and abdominal swelling over multiple weeks with baseline history of chronic fluid retention without formal diagnosis of heart failure in addition to medical noncompliance to home lasix   2-3+ pitting edema in LE bilaterally w/ edema extending into the abdomen  Pro BNP 217  CXR w/ small R pleural effusion  IV lasix   2D ECHO  Strict Is and Os and daily weight  Albumin  pending to rule out component of 3rd spacing given general cachexia  Discussed importance of medication compliance with patient and family  Monitor     Asthma Stable from a resp standpoint  No hypoxia  Cont home inhalers    Dementia with behavioral disturbance (HCC) Family reports worsening agitation at home w/ memory changes and confusion  Had prior ? Eval for dementia with diagnosis- pending  neuro outpatient  re-assessment  On risperdal 0.5 mg BID  Continue  Add extra night time dose in case of secondary delirium    Debility Progressive worsening functional status at home s/p left upper extremity fracture/right patellar fracture 06/2023  Pt minimally ambulating at home  Family has HHRN at home, though with still worsening functional status  Broached issue of increased care  PT/OT eval  Fall precautions  Follow    Hypokalemia K 3.4  Check Mg level  Replete with diuresis    Gastro-esophageal reflux disease without esophagitis PPI   Diabetes mellitus type 2, uncomplicated (HCC) Blood sugar in 120s  SSI  A1C Monitor    Benign prostatic hyperplasia On finasteride        Advance Care Planning:   Code Status: Full Code   Consults: None   Family Communication: Family at the bedside   Severity of Illness: The appropriate patient status for this patient is OBSERVATION. Observation status is judged to be reasonable and necessary in order to provide the required intensity of service to ensure the patient's safety. The patient's presenting symptoms, physical exam findings, and initial radiographic and laboratory data in the context of their medical condition is felt to place them at decreased risk for further clinical deterioration. Furthermore, it is anticipated that the patient will be medically stable for discharge from the hospital within 2 midnights of admission.   Author: Elspeth JINNY Masters, MD 11/12/2023 3:53 PM  For on call review [email or web address].

## 2023-11-12 NOTE — Plan of Care (Signed)
  Problem: Education: Goal: Ability to describe self-care measures that may prevent or decrease complications (Diabetes Survival Skills Education) will improve Outcome: Progressing Goal: Individualized Educational Video(s) Outcome: Progressing   Problem: Coping: Goal: Ability to adjust to condition or change in health will improve Outcome: Progressing   Problem: Fluid Volume: Goal: Ability to maintain a balanced intake and output will improve Outcome: Progressing   Problem: Health Behavior/Discharge Planning: Goal: Ability to identify and utilize available resources and services will improve Outcome: Progressing Goal: Ability to manage health-related needs will improve Outcome: Progressing   Problem: Metabolic: Goal: Ability to maintain appropriate glucose levels will improve Outcome: Progressing   Problem: Nutritional: Goal: Maintenance of adequate nutrition will improve Outcome: Progressing Goal: Progress toward achieving an optimal weight will improve Outcome: Progressing   Problem: Education: Goal: Knowledge of General Education information will improve Description: Including pain rating scale, medication(s)/side effects and non-pharmacologic comfort measures Outcome: Progressing   Problem: Clinical Measurements: Goal: Ability to maintain clinical measurements within normal limits will improve Outcome: Progressing Goal: Will remain free from infection Outcome: Progressing Goal: Diagnostic test results will improve Outcome: Progressing Goal: Respiratory complications will improve Outcome: Progressing Goal: Cardiovascular complication will be avoided Outcome: Progressing   Problem: Activity: Goal: Risk for activity intolerance will decrease Outcome: Progressing   Problem: Elimination: Goal: Will not experience complications related to bowel motility Outcome: Progressing Goal: Will not experience complications related to urinary retention Outcome: Progressing    Problem: Safety: Goal: Ability to remain free from injury will improve Outcome: Progressing   Problem: Education: Goal: Ability to demonstrate management of disease process will improve Outcome: Progressing Goal: Ability to verbalize understanding of medication therapies will improve Outcome: Progressing Goal: Individualized Educational Video(s) Outcome: Progressing   Problem: Skin Integrity: Goal: Risk for impaired skin integrity will decrease Outcome: Progressing

## 2023-11-12 NOTE — ED Provider Notes (Signed)
 [place] EMERGENCY DEPARTMENT AT [facility] Provider Note   CSN: [id number] Arrival date & time: 11/12/23  1125     Patient presents with: Leg Swelling (Abdominal distention /)   LAWERNCE EARLL is a 89 y.o. male.   89 year old male with past medical history of CHF, diabetes, and hypertension presenting to the emergency department today with volume overload.  The patient's family states that he stopped using his Lasix  because he was urinating too much.  Since then he has had worsening swelling in his legs and abdomen up to his chest.  He has had some mild shortness of breath according to his family.  The patient does have some dementia as well which does complicate things.  He saw his primary care doctor and was sent in today for further evaluation due to anasarca.        Prior to Admission medications   Medication Sig Start Date End Date Taking? Authorizing Provider  aspirin  EC 81 MG tablet Take 81 mg by mouth daily. Swallow whole.    [provider]  docusate sodium  (COLACE) 100 MG capsule Take 1 capsule (100 mg total) by mouth 2 (two) times daily. 06/15/22   [name], PA-C  empagliflozin  (JARDIANCE ) 10 MG TABS tablet Take 1 tablet (10 mg total) by mouth daily. Patient taking differently: Take 5 mg by mouth daily. 01/08/23     esomeprazole (NEXIUM) 40 MG capsule Take 40 mg by mouth 2 (two) times daily.    [provider]  ferrous sulfate  325 (65 FE) MG tablet Take 1 tablet (325 mg total) by mouth daily with breakfast. 08/11/23   Jillian Buttery, MD  finasteride  (PROSCAR ) 5 MG tablet Take 1 tablet (5 mg total) by mouth daily. 12/24/22     furosemide  (LASIX ) 20 MG tablet Take 1 tablet (20 mg total) by mouth daily as needed for leg swelling 01/17/22   Shepard Ade, MD  glucose blood (ACCU-CHEK GUIDE TEST) test strip Use 1 strip as directed to test blood sugar twice daily 07/24/23     glucose blood (ACCU-CHEK GUIDE) test strip Use twice daily as  directed 04/18/22     HYDROcodone -acetaminophen  (NORCO/VICODIN) 5-325 MG tablet Take 1 tablet by mouth every 6 (six) hours as needed for moderate pain (pain score 4-6). 08/10/23   Jillian Buttery, MD  loperamide  (IMODIUM  A-D) 2 MG tablet Take 4 mg by mouth 2 (two) times daily.    [provider]  Multiple Vitamin (MULTIVITAMIN WITH MINERALS) TABS tablet Take 1 tablet by mouth daily. 08/11/23   Jillian Buttery, MD  Pancrelipase , Lip-Prot-Amyl, (CREON ) 3000-9500 units CPEP Take 2 capsules (6,000 Units total) by mouth prior to breakfast, lunch, and supper. 09/16/23     polyethylene glycol (MIRALAX  / GLYCOLAX ) 17 g packet Take 17 g by mouth daily. 08/11/23   Jillian Buttery, MD  Probiotic Product (ALIGN) 4 MG CAPS Take 4 mg by mouth daily.     [provider]  thiamine  (VITAMIN B-1) 100 MG tablet Take 1 tablet (100 mg total) by mouth daily. 08/11/23   Jillian Buttery, MD  triamcinolone  cream (KENALOG ) 0.1 % Apply 1 Application topically 3 (three) times daily as needed for swelling 09/06/22       Allergies: Metformin hcl    Review of Systems  Cardiovascular:  Positive for leg swelling.  All other systems reviewed and are negative.   Updated Vital Signs BP 99/61 (BP Location: Right Arm)   Pulse 88   Temp 98 F (36.7 C) (  Oral)   Resp 17   SpO2 99%   Physical Exam Vitals and nursing note reviewed.   Gen: NAD Eyes: PERRL, EOMI HEENT: no oropharyngeal swelling Neck: trachea midline Resp: clear to auscultation bilaterally Card: RRR, no murmurs, rubs, or gallops Abd: Moderately distended, nontender Extremities: 2+ pitting edema noted to the bilateral lower extremities, pitting edema up to the abdomen and lower chest wall Vascular: 2+ radial pulses bilaterally, 2+ DP pulses bilaterally Skin: no rashes Psyc: acting appropriately   (all labs ordered are listed, but only abnormal results are displayed) Labs Reviewed  BASIC METABOLIC PANEL WITH GFR - Abnormal; Notable for the  following components:      Result Value   Potassium 3.4 (*)    Glucose, Bld 126 (*)    Calcium  7.3 (*)    All other components within normal limits  RESP PANEL BY RT-PCR (RSV, FLU A&B, COVID)  [id number]  PRO BRAIN NATRIURETIC PEPTIDE    EKG: None  Radiology: DG Chest 2 View Result Date: 11/12/2023 CLINICAL DATA:  Volume overload.  Lower extremity swelling. EXAM: CHEST - 2 VIEW COMPARISON:  08/05/2023 FINDINGS: Decreased lung volumes are seen since prior exam. Mild bibasilar atelectasis noted. Probable small right pleural effusion. No evidence of pulmonary edema or consolidation. IMPRESSION: Decreased lung volumes, with mild bibasilar atelectasis and probable small right pleural effusion. Electronically Signed   By: Norleen DELENA Kil M.D.   On: 11/12/2023 12:21     Procedures   Medications Ordered in the ED  potassium chloride  SA (KLOR-CON  M) CR tablet 40 mEq (has no administration in time range)  furosemide  (LASIX ) injection 20 mg (has no administration in time range)                                    Medical Decision Making 89 year old male with past medical history of diabetes, hypertension, hyperlipidemia, and CHF presents the emergency department today with anasarca.  Will obtain labs here to evaluate his renal function as well as a chest x-ray to evaluate for pulmonary edema.  Once his initial workup is complete we will start the patient back on Lasix  and reevaluate his renal function.  He will require admission for diuresis.  The patient is stable.  Potassium is a little elevated patient given oral potassium.  Blood pressures are in the high 90s and low 100s.  Will start with 20 mg of Lasix .  Calls placed to the hospitalist service for admission.  Amount and/or Complexity of Data Reviewed Labs: ordered. Radiology: ordered.  Risk Prescription drug management. Decision regarding hospitalization.        Final diagnoses:  Anasarca    ED Discharge Orders     None           Ula Prentice SAUNDERS, MD 11/12/23 1455

## 2023-11-12 NOTE — Assessment & Plan Note (Signed)
 PPI

## 2023-11-12 NOTE — ED Triage Notes (Addendum)
 Pt arrived via POV from dr's office due to fluid overload.  Per family pt doesn't take Furosamide due to excessive urination.  Pt has distended abdomen and edematous extremities.  Right leg weeping fluid, denies shortness of breathe

## 2023-11-12 NOTE — Assessment & Plan Note (Addendum)
 Progressive worsening lower extremity and abdominal swelling over multiple weeks with baseline history of chronic fluid retention without formal diagnosis of heart failure in addition to medical noncompliance to home lasix   2-3+ pitting edema in LE bilaterally w/ edema extending into the abdomen  Pro BNP 217  CXR w/ small R pleural effusion  IV lasix   2D ECHO  Strict Is and Os and daily weight  Albumin  pending to rule out component of 3rd spacing given general cachexia  Discussed importance of medication compliance with patient and family  Monitor

## 2023-11-12 NOTE — Assessment & Plan Note (Signed)
 Stable from a resp standpoint. No hypoxia  --Cont home inhalers

## 2023-11-12 NOTE — Progress Notes (Signed)
 Spoke with the patient's granddaughter, [name], who requested that providers communicate with family members rather than the patient due to the patient's cognitive impairment. Silvano also reported that the patient has previously expressed possible self-harm thoughts. A suicide risk assessment was completed, with the patient scoring 0. The patient is currently alert and oriented, though intermittently forgetful. No further interventions are indicated at this time.

## 2023-11-12 NOTE — ED Notes (Signed)
 Unable to obtain IV access via USIV in ED.  IV team consult placed

## 2023-11-12 NOTE — Assessment & Plan Note (Signed)
 K 3.4 on admission, replaced. K 3.8 today --Monitor & replace with diuresis

## 2023-11-12 NOTE — Assessment & Plan Note (Signed)
 Family reports worsening agitation at home w/ memory changes and confusion.  Had prior ? Eval for dementia with diagnosis- pending neuro outpatient re-assessment  --Continue home risperdal 0.5 mg BID  --Added extra night time dose in case of secondary delirium  --Has upcoming Neurology appointment for assessment

## 2023-11-13 ENCOUNTER — Observation Stay (HOSPITAL_COMMUNITY)

## 2023-11-13 DIAGNOSIS — Z7984 Long term (current) use of oral hypoglycemic drugs: Secondary | ICD-10-CM | POA: Diagnosis not present

## 2023-11-13 DIAGNOSIS — Z87891 Personal history of nicotine dependence: Secondary | ICD-10-CM | POA: Diagnosis not present

## 2023-11-13 DIAGNOSIS — K219 Gastro-esophageal reflux disease without esophagitis: Secondary | ICD-10-CM | POA: Diagnosis not present

## 2023-11-13 DIAGNOSIS — N401 Enlarged prostate with lower urinary tract symptoms: Secondary | ICD-10-CM | POA: Diagnosis not present

## 2023-11-13 DIAGNOSIS — J9811 Atelectasis: Secondary | ICD-10-CM | POA: Diagnosis not present

## 2023-11-13 DIAGNOSIS — E876 Hypokalemia: Secondary | ICD-10-CM | POA: Diagnosis not present

## 2023-11-13 DIAGNOSIS — W19XXXA Unspecified fall, initial encounter: Secondary | ICD-10-CM | POA: Diagnosis present

## 2023-11-13 DIAGNOSIS — F03918 Unspecified dementia, unspecified severity, with other behavioral disturbance: Secondary | ICD-10-CM | POA: Diagnosis not present

## 2023-11-13 DIAGNOSIS — R5381 Other malaise: Secondary | ICD-10-CM | POA: Diagnosis not present

## 2023-11-13 DIAGNOSIS — M7989 Other specified soft tissue disorders: Secondary | ICD-10-CM | POA: Diagnosis not present

## 2023-11-13 DIAGNOSIS — E8770 Fluid overload, unspecified: Secondary | ICD-10-CM | POA: Diagnosis not present

## 2023-11-13 DIAGNOSIS — E8809 Other disorders of plasma-protein metabolism, not elsewhere classified: Secondary | ICD-10-CM | POA: Diagnosis not present

## 2023-11-13 DIAGNOSIS — E119 Type 2 diabetes mellitus without complications: Secondary | ICD-10-CM | POA: Diagnosis not present

## 2023-11-13 DIAGNOSIS — Z79890 Hormone replacement therapy (postmenopausal): Secondary | ICD-10-CM | POA: Diagnosis not present

## 2023-11-13 DIAGNOSIS — R601 Generalized edema: Secondary | ICD-10-CM | POA: Diagnosis not present

## 2023-11-13 DIAGNOSIS — R64 Cachexia: Secondary | ICD-10-CM | POA: Diagnosis not present

## 2023-11-13 DIAGNOSIS — K7460 Unspecified cirrhosis of liver: Secondary | ICD-10-CM | POA: Diagnosis not present

## 2023-11-13 DIAGNOSIS — Z87442 Personal history of urinary calculi: Secondary | ICD-10-CM | POA: Diagnosis not present

## 2023-11-13 DIAGNOSIS — Z515 Encounter for palliative care: Secondary | ICD-10-CM | POA: Diagnosis not present

## 2023-11-13 DIAGNOSIS — I10 Essential (primary) hypertension: Secondary | ICD-10-CM | POA: Diagnosis not present

## 2023-11-13 DIAGNOSIS — E785 Hyperlipidemia, unspecified: Secondary | ICD-10-CM | POA: Diagnosis not present

## 2023-11-13 DIAGNOSIS — Z66 Do not resuscitate: Secondary | ICD-10-CM | POA: Diagnosis not present

## 2023-11-13 DIAGNOSIS — R918 Other nonspecific abnormal finding of lung field: Secondary | ICD-10-CM | POA: Diagnosis not present

## 2023-11-13 DIAGNOSIS — E871 Hypo-osmolality and hyponatremia: Secondary | ICD-10-CM | POA: Diagnosis not present

## 2023-11-13 DIAGNOSIS — J45909 Unspecified asthma, uncomplicated: Secondary | ICD-10-CM | POA: Diagnosis not present

## 2023-11-13 DIAGNOSIS — K760 Fatty (change of) liver, not elsewhere classified: Secondary | ICD-10-CM | POA: Diagnosis not present

## 2023-11-13 DIAGNOSIS — T501X6A Underdosing of loop [high-ceiling] diuretics, initial encounter: Secondary | ICD-10-CM | POA: Diagnosis not present

## 2023-11-13 DIAGNOSIS — Z7982 Long term (current) use of aspirin: Secondary | ICD-10-CM | POA: Diagnosis not present

## 2023-11-13 LAB — COMPREHENSIVE METABOLIC PANEL WITH GFR
ALT: 37 U/L (ref 0–44)
AST: 78 U/L — ABNORMAL HIGH (ref 15–41)
Albumin: 1.8 g/dL — ABNORMAL LOW (ref 3.5–5.0)
Alkaline Phosphatase: 215 U/L — ABNORMAL HIGH (ref 38–126)
Anion gap: 8 (ref 5–15)
BUN: 21 mg/dL (ref 8–23)
CO2: 21 mmol/L — ABNORMAL LOW (ref 22–32)
Calcium: 7.4 mg/dL — ABNORMAL LOW (ref 8.9–10.3)
Chloride: 103 mmol/L (ref 98–111)
Creatinine, Ser: 1.03 mg/dL (ref 0.61–1.24)
GFR, Estimated: >60 mL/min (ref >60)
Glucose, Bld: 90 mg/dL (ref 70–99)
Potassium: 3.7 mmol/L (ref 3.5–5.1)
Sodium: 132 mmol/L — ABNORMAL LOW (ref 135–145)
Total Bilirubin: 1 mg/dL (ref 0.0–1.2)
Total Protein: 4.3 g/dL — ABNORMAL LOW (ref 6.5–8.1)

## 2023-11-13 LAB — CBC
HCT: 33.5 % — ABNORMAL LOW (ref 39.0–52.0)
Hemoglobin: 11.4 g/dL — ABNORMAL LOW (ref 13.0–17.0)
MCH: 30.6 pg (ref 26.0–34.0)
MCHC: 34 g/dL (ref 30.0–36.0)
MCV: 90.1 fL (ref 80.0–100.0)
Platelets: 232 K/uL (ref 150–400)
RBC: 3.72 MIL/uL — ABNORMAL LOW (ref 4.22–5.81)
RDW: 17.2 % — ABNORMAL HIGH (ref 11.5–15.5)
WBC: 12.7 K/uL — ABNORMAL HIGH (ref 4.0–10.5)
nRBC: 0 % (ref 0.0–0.2)

## 2023-11-13 LAB — ECHOCARDIOGRAM COMPLETE
Height: 68 in
S' Lateral: 3.2 cm
Weight: 2433.88 [oz_av]

## 2023-11-13 LAB — GLUCOSE, CAPILLARY
Glucose-Capillary: 93 mg/dL (ref 70–99)
Glucose-Capillary: 76 mg/dL (ref 70–99)
Glucose-Capillary: 82 mg/dL (ref 70–99)
Glucose-Capillary: 114 mg/dL — ABNORMAL HIGH (ref 70–99)

## 2023-11-13 NOTE — Plan of Care (Signed)
  Problem: Nutrition: Goal: Adequate nutrition will be maintained Outcome: Progressing   Problem: Coping: Goal: Level of anxiety will decrease Outcome: Progressing   Problem: Elimination: Goal: Will not experience complications related to bowel motility Outcome: Progressing Goal: Will not experience complications related to urinary retention Outcome: Progressing   Problem: Pain Managment: Goal: General experience of comfort will improve and/or be controlled Outcome: Progressing   Problem: Safety: Goal: Ability to remain free from injury will improve Outcome: Progressing   Problem: Activity: Goal: Capacity to carry out activities will improve Outcome: Progressing

## 2023-11-13 NOTE — Progress Notes (Signed)
 Heart Failure Navigator Progress Note  Assessed for Heart & Vascular TOC clinic readiness.  Patient does not meet criteria due to per MD note patient with history of Dementia. No HF TOC. .   Navigator will sign off at this time.   Randie Bustle, BSN, Scientist, clinical (histocompatibility and immunogenetics) Only

## 2023-11-13 NOTE — Evaluation (Signed)
 Physical Therapy Evaluation Patient Details Name: Francisco [name] MRN: [id number] DOB: 1933/01/20 Today's Date: 11/13/2023  History of Present Illness  Francisco [name] is a 89 y.o. M admitted 11/12/23 presenting from urology appointment with volume overload, intermittent SOB 3+ pitting edema. CXR with bibasilar atelectasis and small R pleural effusion. PMH includes asthma, BPH, t2DM, GERD, HLD, CHF, Dementia  Clinical Impression  Pt presents with dependencies in  mobility secondary to the above diagnosis. Pt/OT cotx today due to patient participation level. Pt's daughter reported he has been receiving PT at an outpatient office and they feel he participates and gets the most out of it vs. HHPT services has has a history of refusing. Pt was min assist with bed mobility and ambulated around the room with a RW. Pt does demonstrate decreased safety and balance with mobility. Pt has a history per family of little carryover and following recommendations. Pt will benefit from acute skilled PT to maximize mobility and independence for d/c to home with family providing assistance.       If plan is discharge home, recommend the following: A little help with walking and/or transfers;Direct supervision/assist for medications management;Assist for transportation;Help with stairs or ramp for entrance   Can travel by private vehicle        Equipment Recommendations None recommended by PT  Recommendations for Other Services       Functional Status Assessment Patient has had a recent decline in their functional status and demonstrates the ability to make significant improvements in function in a reasonable and predictable amount of time.     Precautions / Restrictions Precautions Precautions: Fall Restrictions Weight Bearing Restrictions Per Provider Order: No      Mobility  Bed Mobility Overal bed mobility: Needs Assistance Bed Mobility: Supine to Sit, Sit to Supine     Supine to sit: Mod  assist, HOB elevated, Used rails Sit to supine: Mod assist, HOB elevated, Used rails   General bed mobility comments: cues for technique, assistance to lift bil LE into the bed, trunk support for initial rise up to sitting    Transfers Overall transfer level: Needs assistance Equipment used: Rolling walker (2 wheels) Transfers: Sit to/from Stand Sit to Stand: Min assist, From elevated surface           General transfer comment: cues for hand placement, and to increase forward trunk translation    Ambulation/Gait Ambulation/Gait assistance: Contact guard assist Gait Distance (Feet): 20 Feet Assistive device: Rolling walker (2 wheels) Gait Pattern/deviations: Step-to pattern, Decreased stride length, Trunk flexed Gait velocity: decreased     General Gait Details: cues to stay inside RW and more upright posture  Stairs            Wheelchair Mobility     Tilt Bed    Modified Rankin (Stroke Patients Only)       Balance Overall balance assessment: Needs assistance Sitting-balance support: Feet supported, Bilateral upper extremity supported Sitting balance-Leahy Scale: Fair     Standing balance support: Bilateral upper extremity supported, During functional activity, Reliant on assistive device for balance Standing balance-Leahy Scale: Poor                               Pertinent Vitals/Pain Pain Assessment Pain Assessment: No/denies pain    Home Living Family/patient expects to be discharged to:: Private residence Living Arrangements: Spouse/significant other Available Help at Discharge: Family Type of Home: House Home Access: Level  entry       Home Layout: One level Home Equipment: Agricultural Consultant (2 wheels);Cane - single point Additional Comments: family present during evaluation    Prior Function Prior Level of Function : Needs assist;History of Falls (last six months)       Physical Assist : Mobility (physical) Mobility  (physical): Stairs;Gait;Transfers;Bed mobility   Mobility Comments: hired caregivers for 12 hrs a day, some assistance with mobility using RW       Extremity/Trunk Assessment        Lower Extremity Assessment Lower Extremity Assessment: LLE deficits/detail;RLE deficits/detail RLE Deficits / Details: edema, strength 2/5, dec ROM multi joint RLE Coordination: decreased fine motor LLE Deficits / Details: edema, strength 2/5, dec ROM mulit joint LLE Coordination: decreased fine motor    Cervical / Trunk Assessment Cervical / Trunk Assessment: Kyphotic  Communication   Communication Communication: Impaired Factors Affecting Communication: Hearing impaired    Cognition Arousal: Alert Behavior During Therapy:  (grumpy)   PT - Cognitive impairments: History of cognitive impairments                                 Cueing Cueing Techniques: Verbal cues     General Comments General comments (skin integrity, edema, etc.): LE edema, seaping, diffuse brusing    Exercises     Assessment/Plan    PT Assessment Patient needs continued PT services  PT Problem List Decreased strength;Decreased balance;Decreased range of motion;Decreased mobility;Decreased activity tolerance;Decreased safety awareness       PT Treatment Interventions DME instruction;Functional mobility training;Balance training;Patient/family education;Gait training;Therapeutic activities;Therapeutic exercise    PT Goals (Current goals can be found in the Care Plan section)  Acute Rehab PT Goals Patient Stated Goal: to go home PT Goal Formulation: With patient Time For Goal Achievement: 11/27/23 Potential to Achieve Goals: Good    Frequency Min 3X/week     Co-evaluation PT/OT/SLP Co-Evaluation/Treatment: Yes Reason for Co-Treatment: To address functional/ADL transfers PT goals addressed during session: Mobility/safety with mobility;Balance;Proper use of DME;Strengthening/ROM          AM-PAC PT 6 Clicks Mobility  Outcome Measure Help needed turning from your back to your side while in a flat bed without using bedrails?: A Little Help needed moving from lying on your back to sitting on the side of a flat bed without using bedrails?: A Little Help needed moving to and from a bed to a chair (including a wheelchair)?: A Little Help needed standing up from a chair using your arms (e.g., wheelchair or bedside chair)?: A Little Help needed to walk in hospital room?: A Little Help needed climbing 3-5 steps with a railing? : A Lot 6 Click Score: 17    End of Session Equipment Utilized During Treatment: Gait belt Activity Tolerance: Patient limited by fatigue Patient left: in bed;with call bell/phone within reach;with bed alarm set;with family/visitor present Nurse Communication: Mobility status PT Visit Diagnosis: Unsteadiness on feet (R26.81);Other abnormalities of gait and mobility (R26.89);Muscle weakness (generalized) (M62.81);History of falling (Z91.81)    Time: 1121-1200 PT Time Calculation (min) (ACUTE ONLY): 39 min   Charges:   PT Evaluation $PT Eval Moderate Complexity: 1 Mod PT Treatments $Gait Training: 8-22 mins PT General Charges $$ ACUTE PT VISIT: 1 Visit       Francisco [name], PT  Francisco [name] 11/13/2023, 12:21 PM

## 2023-11-13 NOTE — Progress Notes (Signed)
  Echocardiogram 2D Echocardiogram has been performed.  Francisco [name] 11/13/2023, 9:54 AM

## 2023-11-13 NOTE — Evaluation (Signed)
 Occupational Therapy Evaluation Patient Details Name: Francisco [name] MRN: [id number] DOB: [birth year] Today's Date: 11/13/2023   History of Present Illness   Francisco [name] is a 89 y.o. M admitted 11/12/23 presenting from urology appointment with volume overload, intermittent SOB 3+ pitting edema. CXR with bibasilar atelectasis and small R pleural effusion. PMH includes asthma, BPH, t2DM, GERD, HLD, CHF, Dementia     Clinical Impressions Pt from home where he lives with family and has caregiver 12 hours a day. They assist him with transfers, bathing/dressing and ADL as well as toileting during the day. Family reports a largely sedentary lifestyle. Today Pt presents decreased activity tolerance from baseline - but not too far off. Pt was max A for bathing/dressing, min A for transfers with RW, and will benefit from skilled OT acutely to maximize safety and independence in ADL and functional transfers. After lengthy conversation with family will plan on no OT follow up due to non-compliance in the past (multiple attempts) Pt does best with OPPT. Will make mobility referral here in the hospital as well. Next session continued OOB transfers and functional ADL for activity tolerance.      If plan is discharge home, recommend the following:   A little help with walking and/or transfers;A lot of help with bathing/dressing/bathroom;Assist for transportation;Supervision due to cognitive status     Functional Status Assessment   Patient has had a recent decline in their functional status and demonstrates the ability to make significant improvements in function in a reasonable and predictable amount of time.     Equipment Recommendations   None recommended by OT (Pthas appropriate DME)     Recommendations for Other Services   PT consult     Precautions/Restrictions   Precautions Precautions: Fall Recall of Precautions/Restrictions: Impaired Restrictions Weight Bearing  Restrictions Per Provider Order: No     Mobility Bed Mobility Overal bed mobility: Needs Assistance Bed Mobility: Supine to Sit, Sit to Supine     Supine to sit: Mod assist, HOB elevated, Used rails     General bed mobility comments: cues for technique, assistance to lift bil LE into the bed, trunk support for initial rise up to sitting    Transfers Overall transfer level: Needs assistance Equipment used: Rolling walker (2 wheels) Transfers: Sit to/from Stand Sit to Stand: Min assist, From elevated surface           General transfer comment: cues for hand placement, and to increase forward trunk translation      Balance Overall balance assessment: Needs assistance Sitting-balance support: Feet supported, Bilateral upper extremity supported Sitting balance-Leahy Scale: Fair     Standing balance support: Bilateral upper extremity supported, During functional activity, Reliant on assistive device for balance Standing balance-Leahy Scale: Poor                             ADL either performed or assessed with clinical judgement   ADL Overall ADL's : At baseline;Needs assistance/impaired Eating/Feeding: Set up   Grooming: Wash/dry face;Set up;Sitting Grooming Details (indicate cue type and reason): sitting EOB Upper Body Bathing: Moderate assistance   Lower Body Bathing: Maximal assistance   Upper Body Dressing : Maximal assistance Upper Body Dressing Details (indicate cue type and reason): new gown Lower Body Dressing: Maximal assistance;Bed level Lower Body Dressing Details (indicate cue type and reason): donning socks Toilet Transfer: Minimal assistance;+2 for safety/equipment;Ambulation;Rolling walker (2 wheels) Toilet Transfer Details (indicate cue type and reason):  assist for boost Toileting- Clothing Manipulation and Hygiene: Moderate assistance;Sit to/from stand       Functional mobility during ADLs: Minimal assistance;Rolling walker (2  wheels);Cueing for safety;Cueing for sequencing General ADL Comments: decreased activity tolerance, generalized weakness - not far from baseline, gets assist from caregivers for ADL in home setting     Vision Patient Visual Report: No change from baseline       Perception         Praxis         Pertinent Vitals/Pain Pain Assessment Pain Assessment: Faces Faces Pain Scale: Hurts a little bit Pain Location: BLE Pain Descriptors / Indicators: Tightness, Tender Pain Intervention(s): Monitored during session, Repositioned (BLE elevation at end of session)     Extremity/Trunk Assessment Upper Extremity Assessment Upper Extremity Assessment: Generalized weakness;LUE deficits/detail LUE Deficits / Details: shoulder fx in June - non surgical   Lower Extremity Assessment Lower Extremity Assessment: LLE deficits/detail;RLE deficits/detail RLE Deficits / Details: edema, strength 2/5, dec ROM multi joint RLE Coordination: decreased fine motor LLE Deficits / Details: edema, strength 2/5, dec ROM mulit joint LLE Coordination: decreased fine motor   Cervical / Trunk Assessment Cervical / Trunk Assessment: Kyphotic   Communication Communication Communication: Impaired Factors Affecting Communication: Hearing impaired   Cognition Arousal: Alert Behavior During Therapy: WFL for tasks assessed/performed (grumpy) Cognition: History of cognitive impairments             OT - Cognition Comments: Pt follows commands, has  history of being medically non-compliant, does what he wants when he wants                 Following commands: Intact       Cueing  General Comments   Cueing Techniques: Verbal cues  2 family members in the room and providing history and home set up/PLOF   Exercises     Shoulder Instructions      Home Living Family/patient expects to be discharged to:: Private residence Living Arrangements: Spouse/significant other Available Help at Discharge:  Family Type of Home: House Home Access: Level entry     Home Layout: One level     Bathroom Shower/Tub: Chief Strategy Officer: Standard Bathroom Accessibility: Yes How Accessible: Accessible via walker Home Equipment: Rolling Walker (2 wheels);Cane - single point;Shower seat;Hand held shower head   Additional Comments: family present during evaluation      Prior Functioning/Environment Prior Level of Function : Needs assist;History of Falls (last six months)       Physical Assist : Mobility (physical) Mobility (physical): Stairs;Gait;Transfers;Bed mobility   Mobility Comments: hired caregivers for 12 hrs a day, some assistance with mobility using RW ADLs Comments: assist from caregivers for bathing/dressing and meeting toileting needs    OT Problem List: Decreased strength;Decreased activity tolerance;Decreased safety awareness;Decreased knowledge of use of DME or AE;Increased edema   OT Treatment/Interventions: Self-care/ADL training;Energy conservation;DME and/or AE instruction;Therapeutic activities;Patient/family education;Balance training      OT Goals(Current goals can be found in the care plan section)   Acute Rehab OT Goals Patient Stated Goal: get home OT Goal Formulation: With patient Time For Goal Achievement: 11/27/23 Potential to Achieve Goals: Good ADL Goals Pt Will Perform Grooming: with modified independence;sitting Pt Will Transfer to Toilet: ambulating;with contact guard assist Pt Will Perform Toileting - Clothing Manipulation and hygiene: with min assist;sit to/from stand;with caregiver independent in assisting Additional ADL Goal #1: Pt will perform bed mobility with min A prior to engaging in ADL  OT Frequency:  Min 2X/week    Co-evaluation PT/OT/SLP Co-Evaluation/Treatment: Yes Reason for Co-Treatment: To address functional/ADL transfers PT goals addressed during session: Mobility/safety with mobility;Balance;Proper use of  DME;Strengthening/ROM OT goals addressed during session: ADL's and self-care;Strengthening/ROM;Proper use of Adaptive equipment and DME      AM-PAC OT 6 Clicks Daily Activity     Outcome Measure Help from another person eating meals?: A Little Help from another person taking care of personal grooming?: A Little Help from another person toileting, which includes using toliet, bedpan, or urinal?: A Lot Help from another person bathing (including washing, rinsing, drying)?: A Lot Help from another person to put on and taking off regular upper body clothing?: A Lot Help from another person to put on and taking off regular lower body clothing?: A Lot 6 Click Score: 14   End of Session Equipment Utilized During Treatment: Gait belt;Rolling walker (2 wheels) Nurse Communication: Mobility status  Activity Tolerance: Patient tolerated treatment well Patient left: Other (comment) (with PT walking in room)  OT Visit Diagnosis: Unsteadiness on feet (R26.81);Other abnormalities of gait and mobility (R26.89);Muscle weakness (generalized) (M62.81)                Time: 8878-8858 OT Time Calculation (min): 20 min Charges:  OT General Charges $OT Visit: 1 Visit OT Evaluation $OT Eval Low Complexity: 1 Low  Leita DEL OTR/L Acute Rehabilitation Services Office: [phone]   Leita JINNY Odea 11/13/2023, 1:53 PM

## 2023-11-13 NOTE — Progress Notes (Signed)
 Progress Note   Patient: Francisco [name] FMW:[id number] DOB: Mar 08, 1933 DOA: 11/12/2023     0 DOS: the patient was seen and examined on 11/13/2023   Brief hospital course:  Francisco [name] is a 89 y.o. male with medical history significant of asthma, BPH, t2DM, GERD, HLD presenting with volume overload. Family reports patient went to urology appointment today when patient was noted to have significant 3+ pitting edema with intermittent SOB by urologist. Pt was subsequently sent to the ER for evaluation. Family reports patient w/ an extended history of chronic LE edema. Pt was prescribed lasix . However, he has been relatively non compliant with medication regimen. Pt with noted major fall with  left upper extremity fracture/right patellar fracture 06/2023. Family states that he stopped taking the medication because medication made him excessively urinate. MIld orthopnea and PND. Family/pt report no know history or diagnosis of CHF in the past. Pt has been taking intermittent doses of NSAIDs for pain. Also admits to high salt diet. No CP or SOB. No nausea or vomiting.  Presented to the ER afebrile, hemodynamically stable. Satting well on RA. CBC pending. Cr 1.02, glucose 126, K 3.4.  Pro BNP 217. CXR with bibasilar atelectasis and small R pleural effusion.    Patient admitted for IV diuresis and further evaluation and management as outlined in detail below.   Assessment and Plan: * Anasarca Progressive worsening lower extremity and abdominal swelling over multiple weeks with baseline history of chronic fluid retention without formal diagnosis of heart failure in addition to medical noncompliance to home lasix   2-3+ pitting edema in LE bilaterally w/ edema extending into the abdomen  Pro BNP 217  CXR w/ small R pleural effusion  Hypoalbuminemia Albumin  1.8 -- suspect third-spacing --Continue IV lasix  20 mg BID --Follow pending Echo  --Strict Is and Os and daily weight  --Continue to educate on  importance of compliance with diuretics --Optimize nutrition / protein stores  Asthma Stable from a resp standpoint. No hypoxia  --Cont home inhalers    Dementia with behavioral disturbance (HCC) Family reports worsening agitation at home w/ memory changes and confusion.  Had prior ? Eval for dementia with diagnosis- pending neuro outpatient re-assessment  --Continue home risperdal 0.5 mg BID  --Added extra night time dose in case of secondary delirium  --Has upcoming Neurology appointment for assessment   Debility Progressive worsening functional status at home s/p left upper extremity fracture/right patellar fracture 06/2023  Pt minimally ambulating at home  Family has HHRN at home, though with still worsening functional status  Broached issue of increased care  --PT/OT eval  --Fall precautions Fall precautions  Follow    Hypokalemia K 3.4 on admission, replaced. K 3.7 today --Monitor & replace with diuresis    Gastro-esophageal reflux disease without esophagitis --Continue PPI   Diabetes mellitus type 2, uncomplicated (HCC) --Sliding scale Novolog  --Titrate regimen  Benign prostatic hyperplasia On finasteride          Subjective: Pt seen with wife and daughter at bedside this AM.  Pt wants to go home.  He denies any complaints.  He does not like using diuretics at home due to frequent urination.  Family report pt  has gotten weaker, has been going to outpatient PT. H istorically, he does not work well with [facility] PT or do their recommended exercises, does better when he goes to PT.   Physical Exam: Vitals:   11/12/23 2202 11/13/23 0021 11/13/23 0507 11/13/23 1305  BP: 105/80 102/64 96/63 ROLLEN)  95/58  Pulse: (!) 108 (!) 109 99 (!) 58  Resp: 18 18 18    Temp: 98.9 F (37.2 C) 97.8 F (36.6 C) (!) 97.5 F (36.4 C) (!) 97.5 F (36.4 C)  TempSrc: Oral Oral Oral Oral  SpO2: 94% 96% 96%   Weight:   69 kg   Height:       General exam: awake, alert, no acute  distress, chronically ill appearing HEENT: moist mucus membranes, hearing grossly normal  Respiratory system: CTAB, no wheezes, rales or rhonchi, normal respiratory effort. Cardiovascular system: normal S1/S2,  RRR, 3+ pitting edema of BLE's and dependent edema of trunk.   Gastrointestinal system: soft, NT, ND Central nervous system: A&O x2+. no gross focal neurologic deficits, normal speech Skin: dry, intact, normal temperature, patches of ecchymosis arms Psychiatry: normal mood, congruent affect  Data Reviewed:  Notable labs -- [place] 132, bicarb 21 Ca 7.4 Albumin  1.8 Alk phone 215 AST 76 WBC 12.7 Hbg 11.4  Family Communication: at bedside on rounds  Disposition: Status is: Inpatient Remains inpatient appropriate because: remains on IV diuresis pending further improvement   Planned Discharge Destination: Home with Home Health    Time spent: 45 minutes  Author: Burnard DELENA Cunning, DO 11/13/2023 2:40 PM  For on call review [email or web address].

## 2023-11-13 NOTE — Consult Note (Signed)
 Consultation Note Date: 11/13/2023   Patient Name: Francisco [name]  DOB: 1933/06/26  MRN: [id number]  Age / Sex: 89 y.o., male  PCP: Shepard Ade, MD Referring Physician: Fausto Burnard LABOR, DO  Reason for Consultation: Establishing goals of care  HPI/Patient Profile: 89 y.o. male admitted on 11/12/2023    Clinical Assessment and Goals of Care: 89 year old gentleman who lives at home with his wife, past medical history of asthma BPH diabetes GERD dyslipidemia Admitted from urology office with volume overload Patient has extensive history of chronic lower extremity edema and has been prescribed Lasix  however because of ongoing functional decline, the patient has been noncompliant with the Lasix  medication regimen and because it made him excessively urinate. Mild orthopnea and paroxysmal nocturnal dyspnea as well Echocardiogram has been done and pending results Of note patient had a fall in June.  He had a pneumonia.  He had a prior hospitalization in the past few months.  He also went to [facility] for rehab for short amount of time. Palliative medicine is specialized medical care for people living with serious illness. It focuses on providing relief from the symptoms and stress of a serious illness. The goal is to improve quality of life for both the patient and the family. Goals of care: Broad aims of medical therapy in relation to the patient's values and preferences. Our aim is to provide medical care aimed at enabling patients to achieve the goals that matter most to them, given the circumstances of their particular medical situation and their constraints.  Goals of care discussions undertaken with the patient's family member over the phone.  Discussed about his current condition.  See below.  HCPOA Daughter and granddaughter. Lives at home with wife who is also in her 52's in  [place], [place] .  SUMMARY OF RECOMMENDATIONS   1.  CODE STATUS discussions undertaken.  Patient's granddaughter Ms. patient states that patient has previously completed advance care planning documents and has elected for DNR limited.  Will change patient's CODE STATUS. 2.  Disposition and goals of care.  We discussed about PT OT recommendations, patient's subacute cognitive and physical decline.  We compared and contrasted yet another skilled nursing facility rehabilitation attempt versus consideration of hospice services in the home setting.  Family to discuss among themselves. Continue current mode of care for now Palliative care to follow along Thank you for the consult.  Code Status/Advance Care Planning: DNR   Symptom Management:     Palliative Prophylaxis:  Delirium Protocol    Psycho-social/Spiritual:  Desire for further Chaplaincy support:yes Additional Recommendations: Education on Hospice  Prognosis:  Unable to determine  Discharge Planning: To Be Determined      Primary Diagnoses: Present on Admission:  Anasarca  Hypokalemia  Benign prostatic hyperplasia  Debility  Gastro-esophageal reflux disease without esophagitis   I have reviewed the medical record, interviewed the patient and family, and examined the patient. The following aspects are pertinent.  Past Medical History:  Diagnosis Date   Arthritis  Asthma with allergic rhinitis    BPH (benign prostatic hyperplasia)    DM (diabetes mellitus) (HCC)    Elevated CEA    Elevated prostate specific antigen (PSA)    Fatty liver    GERD (gastroesophageal reflux disease)    History of kidney stones    Hx of adenomatous colonic polyps 02/14/2016   10 adenomas max 15 mm 02/2016 - no recall at his age   Hyperlipidemia    Impotence of organic origin    Neoplasm of uncertain behavior of prostate    Pancreatic cyst FOLLOWED BY DR TERESSA   Personal history of urinary calculi    Pneumonia     Renal calculi 09/19/2011   Right ureteral stone    Social History   Socioeconomic History   Marital status: Married    Spouse name: Not on file   Number of children: 1   Years of education: Not on file   Highest education level: Not on file  Occupational History   Occupation: auctioneer/retierd  Tobacco Use   Smoking status: Former    Current packs/day: 0.00    Types: Cigarettes    Start date: 04/11/1971    Quit date: 04/11/1991    Years since quitting: 32.6   Smokeless tobacco: Never  Vaping Use   Vaping status: Never Used  Substance and Sexual Activity   Alcohol  use: Yes    Comment: 1 can every 6 months or so/rare   Drug use: No   Sexual activity: Not Currently  Other Topics Concern   Not on file  Social History Narrative   Married 1 daughter   Prior truck sales   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   1 beer occasionally, no tobacco at this point no drug use   06/2017   Social Drivers of Health   Financial Resource Strain: Not on file  Food Insecurity: No Food Insecurity (11/12/2023)   Hunger Vital Sign    Worried About Running Out of Food in the Last Year: Never true    Ran Out of Food in the Last Year: Never true  Transportation Needs: No Transportation Needs (11/12/2023)   PRAPARE - Administrator, Civil Service (Medical): No    Lack of Transportation (Non-Medical): No  Physical Activity: Not on file  Stress: Not on file  Social Connections: Socially Isolated (11/12/2023)   Social Connection and Isolation Panel    Frequency of Communication with Friends and Family: Once a week    Frequency of Social Gatherings with Friends and Family: Never    Attends Religious Services: Never    Diplomatic Services Operational Officer: No    Attends Engineer, Structural: Never    Marital Status: Married   Family History  Problem Relation Age of Onset   Diabetes Mother    Diabetes Sister    Cancer Brother        unknown type   Stroke Father     Scheduled Meds:  enoxaparin (LOVENOX) injection  40 mg Subcutaneous Q24H   furosemide   20 mg Intravenous BID   insulin  aspart  0-9 Units Subcutaneous TID WC   risperiDONE  0.5 mg Oral BID   sodium chloride  flush  3 mL Intravenous Q12H   Continuous Infusions:  sodium chloride      PRN Meds:.sodium chloride , acetaminophen  **OR** acetaminophen , ondansetron  **OR** ondansetron  (ZOFRAN ) IV, risperiDONE, sodium chloride  flush Medications Prior to Admission:  Prior to Admission medications   Medication Sig Start Date End Date Taking?  Authorizing Provider  aspirin  EC 81 MG tablet Take 81 mg by mouth daily. Swallow whole.   Yes [provider]  docusate sodium  (COLACE) 100 MG capsule Take 1 capsule (100 mg total) by mouth 2 (two) times daily. 06/15/22  Yes Dancy, Alan, PA-C  empagliflozin  (JARDIANCE ) 10 MG TABS tablet Take 1 tablet (10 mg total) by mouth daily. Patient taking differently: Take 5 mg by mouth daily. 01/08/23  Yes   esomeprazole (NEXIUM) 40 MG capsule Take 40 mg by mouth 2 (two) times daily.   Yes [provider]  ferrous sulfate  325 (65 FE) MG tablet Take 1 tablet (325 mg total) by mouth daily with breakfast. 08/11/23  Yes Adhikari, Ivonne, MD  finasteride  (PROSCAR ) 5 MG tablet Take 1 tablet (5 mg total) by mouth daily. 12/24/22  Yes   KLOR-CON  M20 20 MEQ tablet Take 20 mEq by mouth daily.   Yes [provider]  levothyroxine (SYNTHROID) 50 MCG tablet Take 50 mcg by mouth daily. 09/19/23  Yes [provider]  loperamide  (IMODIUM  A-D) 2 MG tablet Take 4 mg by mouth 2 (two) times daily.   Yes [provider]  LORazepam (ATIVAN) 0.5 MG tablet Take 0.5 mg by mouth daily. 09/06/23  Yes [provider]  Multiple Vitamin (MULTIVITAMIN WITH MINERALS) TABS tablet Take 1 tablet by mouth daily. 08/11/23  Yes Jillian Ivonne, MD  Pancrelipase , Lip-Prot-Amyl, (CREON ) 3000-9500 units CPEP Take 2 capsules (6,000 Units total) by mouth prior to  breakfast, lunch, and supper. 09/16/23  Yes   polyethylene glycol (MIRALAX  / GLYCOLAX ) 17 g packet Take 17 g by mouth daily. 08/11/23  Yes Jillian Ivonne, MD  Probiotic Product (ALIGN) 4 MG CAPS Take 4 mg by mouth daily.    Yes [provider]  risperiDONE (RISPERDAL) 0.5 MG tablet Take 0.5 mg by mouth 2 (two) times daily. 09/03/23  Yes [provider]  triamcinolone  cream (KENALOG ) 0.1 % Apply 1 Application topically 3 (three) times daily as needed for swelling 09/06/22  Yes   furosemide  (LASIX ) 20 MG tablet Take 1 tablet (20 mg total) by mouth daily as needed for leg swelling Patient not taking: Reported on 11/13/2023 01/17/22   Shepard Ade, MD  glucose blood (ACCU-CHEK GUIDE TEST) test strip Use 1 strip as directed to test blood sugar twice daily 07/24/23     glucose blood (ACCU-CHEK GUIDE) test strip Use twice daily as directed 04/18/22     HYDROcodone -acetaminophen  (NORCO/VICODIN) 5-325 MG tablet Take 1 tablet by mouth every 6 (six) hours as needed for moderate pain (pain score 4-6). Patient not taking: Reported on 11/13/2023 08/10/23   Jillian Ivonne, MD  thiamine  (VITAMIN B-1) 100 MG tablet Take 1 tablet (100 mg total) by mouth daily. Patient not taking: Reported on 11/13/2023 08/11/23   Jillian Ivonne, MD   Allergies  Allergen Reactions   Metformin Hcl Other (See Comments)    GI upset   Review of Systems Weakness Physical Exam Elderly gentleman chronically ill-appearing No acute distress Regular work of breathing Appears frail Has peripheral edema bilateral lower extremities   Vital Signs: BP (!) 95/58 (BP Location: Right Arm)   Pulse (!) 58   Temp (!) 97.5 F (36.4 C) (Oral)   Resp 18   Ht 5' 8 (1.727 m)   Wt 69 kg   SpO2 96%   BMI 23.13 kg/m  Pain Scale: 0-10   Pain Score: 0-No pain   SpO2: SpO2: 96 % O2 Device:SpO2: 96 % O2 Flow Rate: .  IO: Intake/output summary:  Intake/Output Summary (Last 24 hours) at 11/13/2023 1510 Last data filed at  11/13/2023 1305 Gross per 24 hour  Intake 760 ml  Output 1000 ml  Net -240 ml    LBM: Last BM Date : 11/12/23 Baseline Weight: Weight: 70.5 kg Most recent weight: Weight: 69 kg     Palliative Assessment/Data:   Palliative performance scale 50%  Time In: 1400 Time Out: 1515 Time Total: 75 Greater than 50%  of this time was spent counseling and coordinating care related to the above assessment and plan.  Signed by: Lonia Serve, MD   Please contact Palliative Medicine Team phone at [phone] for questions and concerns.  For individual provider: See Tracey

## 2023-11-14 DIAGNOSIS — R601 Generalized edema: Secondary | ICD-10-CM | POA: Diagnosis not present

## 2023-11-14 LAB — HEPATIC FUNCTION PANEL
ALT: 46 U/L — ABNORMAL HIGH (ref 0–44)
AST: 83 U/L — ABNORMAL HIGH (ref 15–41)
Albumin: 1.7 g/dL — ABNORMAL LOW (ref 3.5–5.0)
Alkaline Phosphatase: 241 U/L — ABNORMAL HIGH (ref 38–126)
Bilirubin, Direct: 0.5 mg/dL — ABNORMAL HIGH (ref 0.0–0.2)
Indirect Bilirubin: 0.3 mg/dL (ref 0.3–0.9)
Total Bilirubin: 0.8 mg/dL (ref 0.0–1.2)
Total Protein: 4.8 g/dL — ABNORMAL LOW (ref 6.5–8.1)

## 2023-11-14 LAB — URINALYSIS, W/ REFLEX TO CULTURE (INFECTION SUSPECTED)
Bilirubin Urine: NEGATIVE
Glucose, UA: NEGATIVE mg/dL
Hgb urine dipstick: NEGATIVE
Ketones, ur: NEGATIVE mg/dL
Leukocytes,Ua: NEGATIVE
Nitrite: NEGATIVE
Protein, ur: NEGATIVE mg/dL
Specific Gravity, Urine: 1.011 (ref 1.005–1.030)
pH: 5 (ref 5.0–8.0)

## 2023-11-14 LAB — BASIC METABOLIC PANEL WITH GFR
Anion gap: 8 (ref 5–15)
BUN: 25 mg/dL — ABNORMAL HIGH (ref 8–23)
CO2: 22 mmol/L (ref 22–32)
Calcium: 7.5 mg/dL — ABNORMAL LOW (ref 8.9–10.3)
Chloride: 98 mmol/L (ref 98–111)
Creatinine, Ser: 1.16 mg/dL (ref 0.61–1.24)
GFR, Estimated: >60 mL/min (ref >60)
Glucose, Bld: 111 mg/dL — ABNORMAL HIGH (ref 70–99)
Potassium: 3.8 mmol/L (ref 3.5–5.1)
Sodium: 128 mmol/L — ABNORMAL LOW (ref 135–145)

## 2023-11-14 LAB — CBC
HCT: 36.4 % — ABNORMAL LOW (ref 39.0–52.0)
Hemoglobin: 12.1 g/dL — ABNORMAL LOW (ref 13.0–17.0)
MCH: 30 pg (ref 26.0–34.0)
MCHC: 33.2 g/dL (ref 30.0–36.0)
MCV: 90.3 fL (ref 80.0–100.0)
Platelets: 191 K/uL (ref 150–400)
RBC: 4.03 MIL/uL — ABNORMAL LOW (ref 4.22–5.81)
RDW: 17.7 % — ABNORMAL HIGH (ref 11.5–15.5)
WBC: 8 K/uL (ref 4.0–10.5)
nRBC: 0 % (ref 0.0–0.2)

## 2023-11-14 LAB — GLUCOSE, CAPILLARY
Glucose-Capillary: 86 mg/dL (ref 70–99)
Glucose-Capillary: 109 mg/dL — ABNORMAL HIGH (ref 70–99)
Glucose-Capillary: 121 mg/dL — ABNORMAL HIGH (ref 70–99)
Glucose-Capillary: 125 mg/dL — ABNORMAL HIGH (ref 70–99)

## 2023-11-14 MED ORDER — ENSURE PLUS HIGH PROTEIN PO LIQD
237.0000 mL | Freq: Three times a day (TID) | ORAL | Status: DC
  Administered 2023-11-15 – 2023-11-16 (×2): 237 mL via ORAL

## 2023-11-14 MED ORDER — ALBUMIN HUMAN 25 % IV SOLN
12.5000 g | Freq: Two times a day (BID) | INTRAVENOUS | Status: DC
  Administered 2023-11-14 – 2023-11-16 (×4): 12.5 g via INTRAVENOUS
  Filled 2023-11-14 (×5): qty 50

## 2023-11-14 MED ORDER — MIDODRINE HCL 5 MG PO TABS: 5.0000 mg | ORAL_TABLET | Freq: Two times a day (BID) | ORAL | Status: DC

## 2023-11-14 MED ORDER — MIDODRINE HCL 5 MG PO TABS
5.0000 mg | ORAL_TABLET | Freq: Three times a day (TID) | ORAL | Status: DC
  Administered 2023-11-14 – 2023-11-16 (×4): 5 mg via ORAL
  Filled 2023-11-14 (×4): qty 1

## 2023-11-14 NOTE — Progress Notes (Signed)
 Daily Progress Note   Patient Name: Francisco [name]       Date: 11/14/2023 DOB: [birth year]  Age: 89 y.o. MRN#: [id number] Attending Physician: Fausto Burnard LABOR, DO Primary Care Physician: Shepard Ade, MD Admit Date: 11/12/2023  Reason for Consultation/Follow-up: Establishing goals of care  Subjective:  Resting in bed, no distress, wants to go back home towards the end of this hospitalization, believes that his swelling is getting better, denies pain.   Length of Stay: 1  Current Medications: Scheduled Meds:   enoxaparin (LOVENOX) injection  40 mg Subcutaneous Q24H   furosemide   20 mg Intravenous BID   insulin  aspart  0-9 Units Subcutaneous TID WC   midodrine  5 mg Oral BID WC   risperiDONE  0.5 mg Oral BID   sodium chloride  flush  3 mL Intravenous Q12H    Continuous Infusions:   PRN Meds: acetaminophen  **OR** acetaminophen , ondansetron  **OR** ondansetron  (ZOFRAN ) IV, risperiDONE, sodium chloride  flush  Physical Exam         Elderly gentleman chronically ill-appearing No acute distress Regular work of breathing Appears frail Has peripheral edema bilateral lower extremities  Vital Signs: BP (!) 92/59 (BP Location: Right Arm)   Pulse 88   Temp 98 F (36.7 C)   Resp 18   Ht 5' 8 (1.727 m)   Wt 69 kg   SpO2 95%   BMI 23.13 kg/m  SpO2: SpO2: 95 % O2 Device: O2 Device: Room Air O2 Flow Rate:    Intake/output summary:  Intake/Output Summary (Last 24 hours) at 11/14/2023 1045 Last data filed at 11/14/2023 0700 Gross per 24 hour  Intake 720 ml  Output 300 ml  Net 420 ml   LBM: Last BM Date : 11/14/23 Baseline Weight: Weight: 70.5 kg Most recent weight: Weight: 69 kg       Palliative Assessment/Data:      Patient Active Problem List   Diagnosis Date  Noted   Anasarca 11/12/2023   Dementia with behavioral disturbance (HCC) 11/12/2023   Asthma 11/12/2023   Protein-calorie malnutrition, severe 08/09/2023   CAP (community acquired pneumonia) 08/05/2023   Hypokalemia 08/05/2023   Anemia 08/05/2023   Debility 08/05/2023   Hypoalbuminemia 08/05/2023   BPH with obstruction/lower urinary tract symptoms 06/15/2022   Abrasion of unspecified hand, initial encounter 10/17/2020   ARF (acute  renal failure) 08/12/2020   Nausea vomiting and diarrhea 08/12/2020   AKI (acute kidney injury) 08/12/2020   Sensorineural hearing loss (SNHL) of both ears 03/22/2020   Diabetes mellitus type 2, uncomplicated (HCC) 04/28/2018   Elevated carcinoembryonic antigen (CEA) 04/08/2017   History of urinary stone 04/08/2017   Internal Gr 2 and Gr 1 external bleeding hemorrhoids 05/23/2016   Hx of adenomatous colonic polyps 02/14/2016   Fatty liver 11/02/2015   Disorder of soft tissue 07/13/2015   Gastro-esophageal reflux disease without esophagitis 03/07/2015   Non-thrombocytopenic purpura 10/27/2014   Encounter for general adult medical examination without abnormal findings 10/20/2014   Benign prostatic hyperplasia 11/26/2011   Hyperlipidemia 09/28/2011   Renal calculi 09/19/2011    Palliative Care Assessment & Plan   Patient Profile:    Assessment:  89 year old gentleman who lives at home with his wife, past medical history of asthma BPH diabetes GERD dyslipidemia Admitted from urology office with volume overload Patient has extensive history of chronic lower extremity edema and has been prescribed Lasix  however because of ongoing functional decline, the patient has been noncompliant with the Lasix  medication regimen and because it made him excessively urinate. Mild orthopnea and paroxysmal nocturnal dyspnea as well Echocardiogram done.  Of note patient had a fall in June.  He had a pneumonia.  He had a prior hospitalization in the past few months.  He  also went to [facility] facility for rehab for short amount of time.  Recommendations/Plan: Goals of care discussions again held with patient and then also with grand daughter [name] on the phone [phone]. Do not want to initiate hospice services at this time. They are asking about outpatient rehab PT since patient responds better to outpatient rehab rather than home based PT, ok to assign home health care, ok to initiate home based palliative care for additional support.  Continue current mode of care for now.   Will consult TOC.     Code Status:    Code Status Orders  (From admission, onward)           Start     Ordered   11/13/23 1509  Do not attempt resuscitation (DNR)- Limited -Do Not Intubate (DNI)  (Code Status)  Continuous       Question Answer Comment  If pulseless and not breathing No CPR or chest compressions.   In Pre-Arrest Conditions (Patient Is Breathing and Has A Pulse) Do not intubate. Provide all appropriate non-invasive medical interventions. Avoid ICU transfer unless indicated or required.   Consent: Discussion documented in EHR or advanced directives reviewed      11/13/23 1508           Code Status History     Date Active Date Inactive Code Status Order ID Comments User Context   11/12/2023 1543 11/13/2023 1508 Full Code [id number]  Eldonna Elspeth PARAS, MD ED   08/09/2023 1145 08/10/2023 2153 Limited: Do not attempt resuscitation (DNR) -DNR-LIMITED -Do Not Intubate/DNI  [id number]  Jillian Buttery, MD Inpatient   08/05/2023 1942 08/09/2023 1145 Full Code [id number]  Silvester Ales, MD ED   06/15/2022 1420 06/17/2022 1817 Full Code [id number]  Cory Alan RIGGERS Inpatient   08/12/2020 0144 08/15/2020 1604 Full Code [id number]  Franky Redia SAILOR, MD ED      Advance Directive Documentation    Flowsheet Row Most Recent Value  Type of Advance Directive Healthcare Power of Attorney  Pre-existing out of facility DNR order (yellow form or pink  MOST  form) --  MOST Form in Place? --    Prognosis:  < 12 months  Discharge Planning: Home with Palliative Services  Care plan was discussed with  patient, grand daughter [name] at [phone]   Thank you for allowing the Palliative Medicine Team to assist in the care of this patient.  High MDM.      Greater than 50%  of this time was spent counseling and coordinating care related to the above assessment and plan.  Lonia Serve, MD  Please contact Palliative Medicine Team phone at [phone] for questions and concerns.

## 2023-11-14 NOTE — Plan of Care (Signed)
  Problem: Fluid Volume: Goal: Ability to maintain a balanced intake and output will improve Outcome: Progressing   Problem: Metabolic: Goal: Ability to maintain appropriate glucose levels will improve Outcome: Progressing   Problem: Nutritional: Goal: Maintenance of adequate nutrition will improve Outcome: Progressing   Problem: Skin Integrity: Goal: Risk for impaired skin integrity will decrease Outcome: Progressing   Problem: Tissue Perfusion: Goal: Adequacy of tissue perfusion will improve Outcome: Progressing   Problem: Clinical Measurements: Goal: Ability to maintain clinical measurements within normal limits will improve Outcome: Progressing Goal: Will remain free from infection Outcome: Progressing Goal: Diagnostic test results will improve Outcome: Progressing Goal: Respiratory complications will improve Outcome: Progressing Goal: Cardiovascular complication will be avoided Outcome: Progressing   Problem: Nutrition: Goal: Adequate nutrition will be maintained Outcome: Progressing   Problem: Coping: Goal: Level of anxiety will decrease Outcome: Progressing   Problem: Elimination: Goal: Will not experience complications related to bowel motility Outcome: Progressing Goal: Will not experience complications related to urinary retention Outcome: Progressing   Problem: Pain Managment: Goal: General experience of comfort will improve and/or be controlled Outcome: Progressing   Problem: Safety: Goal: Ability to remain free from injury will improve Outcome: Progressing   Problem: Skin Integrity: Goal: Risk for impaired skin integrity will decrease Outcome: Progressing   Problem: Cardiac: Goal: Ability to achieve and maintain adequate cardiopulmonary perfusion will improve Outcome: Progressing

## 2023-11-14 NOTE — Plan of Care (Signed)

## 2023-11-14 NOTE — Progress Notes (Addendum)
 Progress Note   Patient: Francisco [name] FMW:[id number] DOB: [birth year] DOA: 11/12/2023     1 DOS: the patient was seen and examined on 11/14/2023   Brief hospital course:  Francisco [name] is a 89 y.o. male with medical history significant of asthma, BPH, t2DM, GERD, HLD presenting with volume overload. Family reports patient went to urology appointment today when patient was noted to have significant 3+ pitting edema with intermittent SOB by urologist. Pt was subsequently sent to the ER for evaluation. Family reports patient w/ an extended history of chronic LE edema. Pt was prescribed lasix . However, he has been relatively non compliant with medication regimen. Pt with noted major fall with  left upper extremity fracture/right patellar fracture 06/2023. Family states that he stopped taking the medication because medication made him excessively urinate. MIld orthopnea and PND. Family/pt report no know history or diagnosis of CHF in the past. Pt has been taking intermittent doses of NSAIDs for pain. Also admits to high salt diet. No CP or SOB. No nausea or vomiting.  Presented to the ER afebrile, hemodynamically stable. Satting well on RA. CBC pending. Cr 1.02, glucose 126, K 3.4.  Pro BNP 217. CXR with bibasilar atelectasis and small R pleural effusion.    Patient admitted for IV diuresis and further evaluation and management as outlined in detail below.   Assessment and Plan: * Anasarca Progressive worsening lower extremity and abdominal swelling over multiple weeks with baseline history of chronic fluid retention without formal diagnosis of heart failure in addition to medical noncompliance to home lasix   2-3+ pitting edema in LE bilaterally w/ edema extending into the abdomen  Pro BNP 217  CXR w/ small R pleural effusion  Hypoalbuminemia Albumin  1.8 --  suspect third-spacing Echocardiogram was done yesterday (11/12), however images were not adequate to visualize the heart  well.  11/13--not much change in net fluid balance or clinically on exam edema is minimally improved, renal function stable --Continue IV lasix  20 mg BID --Augment diuresis with IV albumin  --Strict Is and Os and daily weight  --Continue to educate on importance of compliance with diuretics --Optimize nutrition / protein stores  Asthma Stable from a resp standpoint. No hypoxia  --Cont home inhalers    Dementia with behavioral disturbance (HCC) Family reports worsening agitation at home w/ memory changes and confusion.  Had prior ? Eval for dementia with diagnosis- pending neuro outpatient re-assessment  --Continue home risperdal 0.5 mg BID  --Added extra night time dose in case of secondary delirium  --Has upcoming Neurology appointment for assessment   Debility Progressive worsening functional status at home s/p left upper extremity fracture/right patellar fracture 06/2023  Pt minimally ambulating at home  Family has HHRN at home, though with still worsening functional status  Broached issue of increased care  --PT/OT eval  --Fall precautions Fall precautions  Follow    Hypokalemia K 3.4 on admission, replaced. K 3.8 today --Monitor & replace with diuresis    Gastro-esophageal reflux disease without esophagitis --Continue PPI   Diabetes mellitus type 2, uncomplicated (HCC) --Sliding scale Novolog  --Titrate regimen  Benign prostatic hyperplasia On finasteride          Subjective: Pt awake resting in bed when seen on rounds this morning.  Family was not present at the time of my visit.  He states he wants to go home.  Denies other acute complaints and says he feels fine and needs to go home.  No acute events reported.   Physical Exam: Vitals:  11/13/23 0507 11/13/23 1305 11/13/23 2120 11/14/23 0622  BP: 96/63 (!) 95/58 (!) 97/58 (!) 92/59  Pulse: 99 (!) 58 90 88  Resp: 18  18 18   Temp: (!) 97.5 F (36.4 C) (!) 97.5 F (36.4 C) 98.1 F (36.7 C) 98 F (36.7  C)  TempSrc: Oral Oral Oral   SpO2: 96%  97% 95%  Weight: 69 kg     Height:       General exam: awake, alert, no acute distress, chronically ill appearing, mildly confused HEENT: moist mucus membranes, hearing grossly normal  Respiratory system: Clear without wheezes or rhonchi, normal respiratory effort. Cardiovascular system: normal S1/S2,  RRR, unchanged 3+ pitting edema of BLE's and dependent edema of trunk and scrotum.   Gastrointestinal system: soft, NT, ND Central nervous system: A&O x2+. no gross focal neurologic deficits, normal speech Skin: dry, intact, normal temperature, patches of ecchymosis arms Psychiatry: normal mood, congruent affect, abnormal judgment and insight due to dementia  Data Reviewed:  Notable labs -- , Sodium 128 down from 132 Glucose 111 BUN 25 Creatinine 1.03 >> 1.16 WBC normalized 8.0 Hemoglobin improved 12.1   Last albumin  1.8 Last alk phone 215 Last AST 76   Family Communication: at bedside on rounds 11/12.  Not present during my encounter today, will attempt to call as time allows  Disposition: Status is: Inpatient Remains inpatient appropriate because: remains on IV diuresis pending further improvement   Planned Discharge Destination: Home with Home Health    Time spent: 45 minutes  Author: Burnard DELENA Cunning, DO 11/14/2023 2:03 PM  For on call review [email or web address].

## 2023-11-15 ENCOUNTER — Inpatient Hospital Stay (HOSPITAL_COMMUNITY)

## 2023-11-15 ENCOUNTER — Other Ambulatory Visit (HOSPITAL_COMMUNITY): Payer: Self-pay

## 2023-11-15 DIAGNOSIS — R601 Generalized edema: Secondary | ICD-10-CM | POA: Diagnosis not present

## 2023-11-15 DIAGNOSIS — E871 Hypo-osmolality and hyponatremia: Secondary | ICD-10-CM | POA: Insufficient documentation

## 2023-11-15 LAB — BASIC METABOLIC PANEL WITH GFR
Anion gap: 7 (ref 5–15)
BUN: 25 mg/dL — ABNORMAL HIGH (ref 8–23)
CO2: 24 mmol/L (ref 22–32)
Calcium: 7.2 mg/dL — ABNORMAL LOW (ref 8.9–10.3)
Chloride: 97 mmol/L — ABNORMAL LOW (ref 98–111)
Creatinine, Ser: 1.09 mg/dL (ref 0.61–1.24)
GFR, Estimated: >60 mL/min (ref >60)
Glucose, Bld: 84 mg/dL (ref 70–99)
Potassium: 3.6 mmol/L (ref 3.5–5.1)
Sodium: 128 mmol/L — ABNORMAL LOW (ref 135–145)

## 2023-11-15 LAB — HEPATIC FUNCTION PANEL
ALT: 39 U/L (ref 0–44)
AST: 66 U/L — ABNORMAL HIGH (ref 15–41)
Albumin: 1.8 g/dL — ABNORMAL LOW (ref 3.5–5.0)
Alkaline Phosphatase: 187 U/L — ABNORMAL HIGH (ref 38–126)
Bilirubin, Direct: 0.5 mg/dL — ABNORMAL HIGH (ref 0.0–0.2)
Indirect Bilirubin: 0.2 mg/dL — ABNORMAL LOW (ref 0.3–0.9)
Total Bilirubin: 0.7 mg/dL (ref 0.0–1.2)
Total Protein: 4.1 g/dL — ABNORMAL LOW (ref 6.5–8.1)

## 2023-11-15 LAB — PROTIME-INR
INR: 1.1 (ref 0.8–1.2)
Prothrombin Time: 14.3 s (ref 11.4–15.2)

## 2023-11-15 LAB — PHOSPHORUS: Phosphorus: 2.6 mg/dL (ref 2.5–4.6)

## 2023-11-15 LAB — GLUCOSE, CAPILLARY
Glucose-Capillary: 92 mg/dL (ref 70–99)
Glucose-Capillary: 132 mg/dL — ABNORMAL HIGH (ref 70–99)
Glucose-Capillary: 108 mg/dL — ABNORMAL HIGH (ref 70–99)

## 2023-11-15 LAB — MAGNESIUM: Magnesium: 1.5 mg/dL — ABNORMAL LOW (ref 1.7–2.4)

## 2023-11-15 MED ORDER — MAGNESIUM SULFATE 4 GM/100ML IV SOLN
4.0000 g | Freq: Once | INTRAVENOUS | Status: AC
  Administered 2023-11-15: 4 g via INTRAVENOUS
  Filled 2023-11-15: qty 100

## 2023-11-15 MED ORDER — TRAMADOL HCL 50 MG PO TABS
50.0000 mg | ORAL_TABLET | Freq: Four times a day (QID) | ORAL | Status: DC | PRN
  Administered 2023-11-15 – 2023-11-16 (×2): 50 mg via ORAL
  Filled 2023-11-15 (×3): qty 1

## 2023-11-15 NOTE — Plan of Care (Signed)
  Problem: Education: Goal: Ability to describe self-care measures that may prevent or decrease complications (Diabetes Survival Skills Education) will improve Outcome: Not Progressing Goal: Individualized Educational Video(s) Outcome: Not Progressing   Problem: Coping: Goal: Ability to adjust to condition or change in health will improve Outcome: Not Progressing   Problem: Fluid Volume: Goal: Ability to maintain a balanced intake and output will improve Outcome: Not Progressing   Problem: Health Behavior/Discharge Planning: Goal: Ability to identify and utilize available resources and services will improve Outcome: Not Progressing Goal: Ability to manage health-related needs will improve Outcome: Not Progressing   Problem: Metabolic: Goal: Ability to maintain appropriate glucose levels will improve Outcome: Not Progressing   

## 2023-11-15 NOTE — Progress Notes (Addendum)
 Physical Therapy Treatment Patient Details Name: Francisco [name] MRN: [id number] DOB: [birth year] Today's Date: 11/15/2023   History of Present Illness Francisco [name] is a 89 y.o. M admitted 11/12/23 presenting from urology appointment with volume overload, intermittent SOB 3+ pitting edema. CXR with bibasilar atelectasis and small R pleural effusion. PMH includes asthma, BPH, t2DM, GERD, HLD, CHF, Dementia    PT Comments   Pt admitted with above diagnosis.  Pt currently with functional limitations due to the deficits listed below (see PT Problem List). PT arrived and pt in bed. PT aware of soft Bp earlier in day and bp at rest with HOB and foot of bed slightly elevated 78/52 PR 92-95 and o2 saturation 91%. No apparent signs or symptoms of respiratory distress, pt moaning and occasionally verbalizing oh shit no episodes of nausea with pt able to drink water  at end of therapy session with HOB elevated. PT noted pt B UE weeping and had saturated gown and linins. PT facilitated rolling and side lying with nurse tech assisting with linins and dressing tasks. Pt required max A to roll side to side with use of bed rails. Pt positioned with B UE elevated, heels floated and all needs in place. Pt exhibits a very guarded rehab potential and nurse indicated pt may transition to comfort care and d/c planning will need to be re-assessed.  Pt will benefit from acute skilled PT to increase their independence and safety with mobility to allow discharge.     If plan is discharge home, recommend the following: A little help with walking and/or transfers;Direct supervision/assist for medications management;Assist for transportation;Help with stairs or ramp for entrance   Can travel by private vehicle        Equipment Recommendations  None recommended by PT    Recommendations for Other Services       Precautions / Restrictions Precautions Precautions: Fall Recall of Precautions/Restrictions:  Impaired Restrictions Weight Bearing Restrictions Per Provider Order: No     Mobility  Bed Mobility Overal bed mobility: Needs Assistance Bed Mobility: Rolling Rolling: Max assist, Used rails         General bed mobility comments: pt required max A to roll side to side, once in side lying able to maintain with min A and B UE support at bed rails, pt able to follow one step cues with increased time however minimal initiation of movement    Transfers                        Ambulation/Gait                   Stairs             Wheelchair Mobility     Tilt Bed    Modified Rankin (Stroke Patients Only)       Balance Overall balance assessment: Needs assistance Sitting-balance support: Feet supported, Bilateral upper extremity supported Sitting balance-Leahy Scale: Fair     Standing balance support: Bilateral upper extremity supported, During functional activity, Reliant on assistive device for balance Standing balance-Leahy Scale: Poor                              Communication Communication Communication: Impaired Factors Affecting Communication: Hearing impaired (B hearing aids)  Cognition Arousal: Alert Behavior During Therapy: Flat affect   PT - Cognitive impairments: History of cognitive impairments  Following commands: Intact      Cueing Cueing Techniques: Verbal cues  Exercises      General Comments        Pertinent Vitals/Pain Pain Assessment Pain Assessment: Faces Faces Pain Scale: Hurts even more Pain Location: BLE Pain Descriptors / Indicators: Tightness, Tender, Moaning Pain Intervention(s): Limited activity within patient's tolerance, Repositioned, Monitored during session    Home Living                          Prior Function            PT Goals (current goals can now be found in the care plan section) Acute Rehab PT Goals Patient Stated Goal: to go  home PT Goal Formulation: With patient Time For Goal Achievement: 11/27/23 Potential to Achieve Goals: Good    Frequency    Min 3X/week      PT Plan      Co-evaluation PT/OT/SLP Co-Evaluation/Treatment: Yes            AM-PAC PT 6 Clicks Mobility   Outcome Measure  Help needed turning from your back to your side while in a flat bed without using bedrails?: A Little Help needed moving from lying on your back to sitting on the side of a flat bed without using bedrails?: A Little Help needed moving to and from a bed to a chair (including a wheelchair)?: A Little Help needed standing up from a chair using your arms (e.g., wheelchair or bedside chair)?: A Little Help needed to walk in hospital room?: A Little Help needed climbing 3-5 steps with a railing? : A Lot 6 Click Score: 17    End of Session Equipment Utilized During Treatment: Gait belt Activity Tolerance: Patient limited by fatigue Patient left: in bed;with call bell/phone within reach;with bed alarm set;with family/visitor present Nurse Communication: Mobility status PT Visit Diagnosis: Unsteadiness on feet (R26.81);Other abnormalities of gait and mobility (R26.89);Muscle weakness (generalized) (M62.81);History of falling (Z91.81)     Time: 8476-8446 PT Time Calculation (min) (ACUTE ONLY): 30 min  Charges:    $Therapeutic Activity: 23-37 mins PT General Charges $$ ACUTE PT VISIT: 1 Visit                     [place], PT Acute Rehab    [place] VEAR Drone 11/15/2023, 4:20 PM

## 2023-11-15 NOTE — Progress Notes (Addendum)
 Progress Note   Patient: Francisco [name] FMW:[id number] DOB: November 05, 1933 DOA: 11/12/2023     2 DOS: the patient was seen and examined on 11/15/2023   Brief hospital course:  Francisco [name] is a 89 y.o. male with medical history significant of asthma, BPH, t2DM, GERD, HLD presenting with volume overload. Family reports patient went to urology appointment today when patient was noted to have significant 3+ pitting edema with intermittent SOB by urologist. Pt was subsequently sent to the ER for evaluation. Family reports patient w/ an extended history of chronic LE edema. Pt was prescribed lasix . However, he has been relatively non compliant with medication regimen. Pt with noted major fall with  left upper extremity fracture/right patellar fracture 06/2023. Family states that he stopped taking the medication because medication made him excessively urinate. MIld orthopnea and PND. Family/pt report no know history or diagnosis of CHF in the past. Pt has been taking intermittent doses of NSAIDs for pain. Also admits to high salt diet. No CP or SOB. No nausea or vomiting.  Presented to the ER afebrile, hemodynamically stable. Satting well on RA. CBC pending. Cr 1.02, glucose 126, K 3.4.  Pro BNP 217. CXR with bibasilar atelectasis and small R pleural effusion.    Patient admitted for IV diuresis and further evaluation and management as outlined in detail below.   Assessment and Plan:  New cough - check chest xray to assess if developing PNA, and re-assess small pleural effusion seen  on initial CXR.  * Anasarca Progressive worsening lower extremity and abdominal swelling over multiple weeks with baseline history of chronic fluid retention without formal diagnosis of heart failure in addition to medical noncompliance to home lasix   2-3+ pitting edema in LE bilaterally w/ edema extending into the abdomen  Pro BNP 217  CXR w/ small R pleural effusion  Hypoalbuminemia Albumin  1.8 --  suspect  third-spacing Echocardiogram was done yesterday (11/12), however images were not adequate to visualize the heart well.  11/13--not much change in net fluid balance or clinically on exam edema is minimally improved, renal function stable 11/14 -- wt finally trended down. No outputs charted, pt incontinent.  Very soft BP's.  Edema appears unchanged. --Continue IV lasix  20 mg BID --Augment diuresis with IV albumin  --Strict Is and Os and daily weight  --Continue to educate on importance of compliance with diuretics --Optimize nutrition / protein stores  Hyponatremia Na trended down to 128, stable there. ?Intravascular volume depletion from diuresis, but remains with profound edema. --Continue diuresis and monitor closely  Asthma Stable from a resp standpoint. No hypoxia  --Cont home inhalers    Dementia with behavioral disturbance (HCC) Family reports worsening agitation at home w/ memory changes and confusion.  Had prior ? Eval for dementia with diagnosis- pending neuro outpatient re-assessment  --Continue home risperdal 0.5 mg BID  --Added extra night time dose in case of secondary delirium  --Has upcoming Neurology appointment for assessment   Debility Progressive worsening functional status at home s/p left upper extremity fracture/right patellar fracture 06/2023  Pt minimally ambulating at home  Family has HHRN at home, though with still worsening functional status  Broached issue of increased care  --PT/OT eval  --Fall precautions Fall precautions  Follow    Hypokalemia K 3.4 on admission, replaced. K 3.6 today --Monitor & replace with diuresis    Gastro-esophageal reflux disease without esophagitis --Continue PPI   Diabetes mellitus type 2, uncomplicated (HCC) --Sliding scale Novolog  --Titrate regimen  Benign prostatic hyperplasia  On finasteride          Subjective: Pt awake resting in bed this AM on rounds.  RN had messaged earlier pt complaining of  diffuse pain all over.  Given Tramadol .  Pt denies pain during my visit this AM.  He continues to state he wants to go home.  He agrees to try working with PT when told he is too weak to go home, will need stronger legs in order to go home.  He tends to refuse PT (here and at home per family, only cooperates at oupatient PT).  Pt too weak to stand up without a lot of assistance right now.  Requires more assistance than wife could manage when just her at home with him.  Pt denies any other complaints.    Physical Exam: Vitals:   11/15/23 0536 11/15/23 1042 11/15/23 1230 11/15/23 1346  BP: (!) 101/58 107/70 92/65 (!) 85/56  Pulse: 79 (!) 114 (!) 102 93  Resp: 18 (!) 26 20 16   Temp: 98.2 F (36.8 C) 98.7 F (37.1 C)  98.7 F (37.1 C)  TempSrc:  Oral  Oral  SpO2: 100% 96% (!) 89% 98%  Weight:      Height:       General exam: awake, alert, no acute distress, chronically ill appearing, mildly confused HEENT: moist mucus membranes, hearing grossly normal  Respiratory system: Clear with diminished bases without wheezes or rhonchi, normal respiratory effort, on room air. Cardiovascular system: normal S1/S2,  RRR, unchanged 3+ pitting edema of BLE's and severe dependent edema of trunk and scrotum.  Edema is minimally changed since admission Gastrointestinal system: soft, NT, ND Central nervous system: A&O x2+. no gross focal neurologic deficits, normal speech Skin: dry, intact, normal temperature, patches of ecchymosis arms Psychiatry: normal mood, congruent affect, abnormal judgment and insight due to dementia  Data Reviewed:  Notable labs -- , Sodium 128  x 2 Cl 97 BUN 25 Creatinine 1.03 >> 1.16 >> 1.09  albumin  1.8 alk phos 187 Last AST 76 >> 66   Family Communication: Wife, daughter and granddaughter at bedside on rounds this AM, and again in afternoon in meeting with Palliative Care, Dr. Jeryl.  GOALS OF CARE Extensive discussions about patient's clinical status, multiple  complex medication conditions, and lack of response so far to aggressive attempt to diurese and improve his edema.  Pt has become progressively weak and deconditioned, having diffuse pain, showing signs of rapid decline in recent weeks by family's observation.  Prognosis is poor. Plan is for transition to comfort measures and pursue hospice either at home vs hospice facility depending on his symptom burden and clinical course.   Disposition: Status is: Inpatient Remains inpatient appropriate because:  Transitioning to comfort care in next 24 hours. Requiring IV therapies.  Too deconditioned to safely return home unless 24/7 caregiver support in place.     Planned Discharge Destination: Home with Home Health    Time spent: 75 minutes  Author: Burnard DELENA Cunning, DO 11/15/2023 7:13 PM  For on call review [email or web address].

## 2023-11-15 NOTE — Assessment & Plan Note (Signed)
 Na trended down to 128, stable there. ?Intravascular volume depletion from diuresis, but remains with profound edema. --Continue diuresis and monitor closely

## 2023-11-15 NOTE — Progress Notes (Signed)
 MEWS Progress Note  Patient Details Name: CORDALE MANERA MRN: [id number] DOB: 15-Feb-1933 Today's Date: 11/15/2023   MEWS Flowsheet Documentation:  Assess: MEWS Score Temp: 98.7 F (37.1 C) BP: 107/70 MAP (mmHg): 82 Pulse Rate: (!) 114 ECG Heart Rate: 79 Resp: (!) 26 Level of Consciousness: Alert SpO2: 96 % O2 Device: Room Air Assess: MEWS Score MEWS Temp: 0 MEWS Systolic: 0 MEWS Pulse: 2 MEWS RR: 2 MEWS LOC: 0 MEWS Score: 4 MEWS Score Color: Red Assess: SIRS CRITERIA SIRS Temperature : 0 SIRS Respirations : 1 SIRS Pulse: 1 SIRS WBC: 0 SIRS Score Sum : 2   Provider Notification Provider Name/Title: Fausto Date Provider Notified: 11/15/23 Time Provider Notified: 1046 Notification Reason: Other (Comment) (RED MEWS) Date of Provider Response: 11/15/23 Time of Provider Response: 1046 MD Fausto armin Serve notified.     Cordai Rodrigue Alondra  Mozqueda Jaramillo 11/15/2023, 10:47 AM

## 2023-11-15 NOTE — Progress Notes (Signed)
 Daily Progress Note   Patient Name: Francisco [name]       Date: 11/15/2023 DOB: [birth year]  Age: 89 y.o. MRN#: [id number] Attending Physician: Fausto Burnard LABOR, DO Primary Care Physician: Shepard Ade, MD Admit Date: 11/12/2023  Reason for Consultation/Follow-up: Establishing goals of care  Subjective:  Continues with generalized weakness, was moaning earlier, low BP today, less alert. One episode of vomiting earlier this am.   Length of Stay: 2  Current Medications: Scheduled Meds:   enoxaparin (LOVENOX) injection  40 mg Subcutaneous Q24H   feeding supplement  237 mL Oral TID BM   furosemide   20 mg Intravenous BID   insulin  aspart  0-9 Units Subcutaneous TID WC   midodrine  5 mg Oral TID WC   risperiDONE  0.5 mg Oral BID   sodium chloride  flush  3 mL Intravenous Q12H    Continuous Infusions:  albumin  human 12.5 g (11/15/23 0852)    PRN Meds: acetaminophen  **OR** acetaminophen , ondansetron  **OR** ondansetron  (ZOFRAN ) IV, risperiDONE, sodium chloride  flush, traMADol   Physical Exam         Elderly gentleman chronically ill-appearing Mild acute distress Regular work of breathing Appears frail Has peripheral edema bilateral lower extremities  Vital Signs: BP (!) 85/56 (BP Location: Right Arm) Comment: MD notified, pt being transitioned to comfort  Pulse 93   Temp 98.7 F (37.1 C) (Oral)   Resp 16   Ht 5' 8 (1.727 m)   Wt 67.9 kg   SpO2 98%   BMI 22.76 kg/m  SpO2: SpO2: 98 % O2 Device: O2 Device: Room Air O2 Flow Rate:    Intake/output summary:  Intake/Output Summary (Last 24 hours) at 11/15/2023 1419 Last data filed at 11/15/2023 0300 Gross per 24 hour  Intake 143.1 ml  Output --  Net 143.1 ml   LBM: Last BM Date : 11/14/23 Baseline Weight: Weight:  70.5 kg Most recent weight: Weight: 67.9 kg       Palliative Assessment/Data:      Patient Active Problem List   Diagnosis Date Noted   Anasarca 11/12/2023   Dementia with behavioral disturbance (HCC) 11/12/2023   Asthma 11/12/2023   Protein-calorie malnutrition, severe 08/09/2023   CAP (community acquired pneumonia) 08/05/2023   Hypokalemia 08/05/2023   Anemia 08/05/2023   Debility 08/05/2023   Hypoalbuminemia 08/05/2023  BPH with obstruction/lower urinary tract symptoms 06/15/2022   Abrasion of unspecified hand, initial encounter 10/17/2020   ARF (acute renal failure) 08/12/2020   Nausea vomiting and diarrhea 08/12/2020   AKI (acute kidney injury) 08/12/2020   Sensorineural hearing loss (SNHL) of both ears 03/22/2020   Diabetes mellitus type 2, uncomplicated (HCC) 04/28/2018   Elevated carcinoembryonic antigen (CEA) 04/08/2017   History of urinary stone 04/08/2017   Internal Gr 2 and Gr 1 external bleeding hemorrhoids 05/23/2016   Hx of adenomatous colonic polyps 02/14/2016   Fatty liver 11/02/2015   Disorder of soft tissue 07/13/2015   Gastro-esophageal reflux disease without esophagitis 03/07/2015   Non-thrombocytopenic purpura 10/27/2014   Encounter for general adult medical examination without abnormal findings 10/20/2014   Benign prostatic hyperplasia 11/26/2011   Hyperlipidemia 09/28/2011   Renal calculi 09/19/2011    Palliative Care Assessment & Plan   Patient Profile:    Assessment:  89 year old gentleman who lives at home with his wife, past medical history of asthma BPH diabetes GERD dyslipidemia Admitted from urology office with volume overload Patient has extensive history of chronic lower extremity edema and has been prescribed Lasix  however because of ongoing functional decline, the patient has been noncompliant with the Lasix  medication regimen and because it made him excessively urinate. Mild orthopnea and paroxysmal nocturnal dyspnea as  well Echocardiogram done.  Of note patient had a fall in June.  He had a pneumonia.  He had a prior hospitalization in the past few months.  He also went to [facility] facility for rehab for short amount of time.  Recommendations/Plan: Goals of care discussions held alongside [facility] colleague Dr Fausto, also with patient's wife, daughter and grand daughter present, we reviewed his current condition and talked about his underlying serious illnesses and his sub acute functional and cognitive status decline. Goals of care discussions undertaken again -  DNR DNI Monitor hospital course for the next 24 hours and consider comfort measures if no meaningful improvement Re discussed hospice - home with hospice versus residential hospice in detail with family.  Some additional workup with labs and CXR and abd ultrasound to be done as per hospital medicine recommendations.  PMT to follow closely.       Code Status:    Code Status Orders  (From admission, onward)           Start     Ordered   11/13/23 1509  Do not attempt resuscitation (DNR)- Limited -Do Not Intubate (DNI)  (Code Status)  Continuous       Question Answer Comment  If pulseless and not breathing No CPR or chest compressions.   In Pre-Arrest Conditions (Patient Is Breathing and Has A Pulse) Do not intubate. Provide all appropriate non-invasive medical interventions. Avoid ICU transfer unless indicated or required.   Consent: Discussion documented in EHR or advanced directives reviewed      11/13/23 1508           Code Status History     Date Active Date Inactive Code Status Order ID Comments User Context   11/12/2023 1543 11/13/2023 1508 Full Code [id number]  Eldonna Elspeth PARAS, MD ED   08/09/2023 1145 08/10/2023 2153 Limited: Do not attempt resuscitation (DNR) -DNR-LIMITED -Do Not Intubate/DNI  [id number]  Jillian Buttery, MD Inpatient   08/05/2023 1942 08/09/2023 1145 Full Code [id number]  Silvester Ales, MD ED    06/15/2022 1420 06/17/2022 1817 Full Code [id number]  [name], PA-C Inpatient   08/12/2020 0144 08/15/2020 1604  Full Code [id number]  Franky Redia SAILOR, MD ED      Advance Directive Documentation    Flowsheet Row Most Recent Value  Type of Advance Directive Healthcare Power of Attorney  Pre-existing out of facility DNR order (yellow form or pink MOST form) --  MOST Form in Place? --    Prognosis:  guarded  Discharge Planning: To be determined.  Care plan was discussed with  patient, [facility], wife, daughter and grand daughter [name]    Thank you for allowing the Palliative Medicine Team to assist in the care of this patient.  High MDM.      Greater than 50%  of this time was spent counseling and coordinating care related to the above assessment and plan.  Lonia Serve, MD  Please contact Palliative Medicine Team phone at [phone] for questions and concerns.

## 2023-11-15 NOTE — Progress Notes (Signed)
 Initial Nutrition Assessment  INTERVENTION:   -Continue Ensure Plus High Protein po BID, each supplement provides 350 kcal and 20 grams of protein once/if diet advanced  -Will monitor for GOC decisions  NUTRITION DIAGNOSIS:   Inadequate oral intake related to chronic illness (dementia) as evidenced by NPO status.  GOAL:   Patient will meet greater than or equal to 90% of their needs  MONITOR:   PO intake, Diet advancement  REASON FOR ASSESSMENT:   Consult Assessment of nutrition requirement/status  ASSESSMENT:   89 y.o. male with medical history significant of asthma, BPH, t2DM, GERD, HLD presenting with volume overload. Family reports patient went to urology appointment 11/11 when patient was noted to have significant 3+ pitting edema with intermittent SOB by urologist. Patient admitted for IV diuresis and further evaluation and management.  Patient undergoing GOC discussions today. Was made NPO this morning after vomiting episode. Per RN ,pt complains of being in a lot of pain and doesn't want to see anyone. Will continue to monitor GOC and plan going forward. No nutrition interventions warranted at this time until diet can be advanced. Recommend continuing Ensure supplements if diet is advanced. Noted order for chest x-ray.  Per weight records, pt's weight has been trending up since August 2025. Had weight loss from June to August and then weight was trending up.  Pt with moderate BLE edema currently, diuresing. Current weight: 149 lbs.  Medications: Lasix , IV Mg sulfate, Zofran   Labs reviewed: CBGs: 92-125 Low sodium Low Mg   NUTRITION - FOCUSED PHYSICAL EXAM:  Unable to complete  Diet Order:   Diet Order             Diet NPO time specified  Diet effective now                   EDUCATION NEEDS:   Not appropriate for education at this time  Skin:  Skin Assessment: Reviewed RN Assessment  Last BM:  11/13 -type 4  Height:   Ht Readings from Last  1 Encounters:  11/12/23 5' 8 (1.727 m)    Weight:   Wt Readings from Last 1 Encounters:  11/15/23 67.9 kg    BMI:  Body mass index is 22.76 kg/m.  Estimated Nutritional Needs:   Kcal:  1600-1800  Protein:  75-85g  Fluid:  1.6L/day   Morna Lee, MS, RD, LDN Inpatient Clinical Dietitian Contact via Secure chat

## 2023-11-16 DIAGNOSIS — Z515 Encounter for palliative care: Secondary | ICD-10-CM

## 2023-11-16 DIAGNOSIS — R601 Generalized edema: Secondary | ICD-10-CM | POA: Diagnosis not present

## 2023-11-16 LAB — COMPREHENSIVE METABOLIC PANEL WITH GFR
ALT: 47 U/L — ABNORMAL HIGH (ref 0–44)
AST: 88 U/L — ABNORMAL HIGH (ref 15–41)
Albumin: 2.2 g/dL — ABNORMAL LOW (ref 3.5–5.0)
Alkaline Phosphatase: 311 U/L — ABNORMAL HIGH (ref 38–126)
Anion gap: 12 (ref 5–15)
BUN: 26 mg/dL — ABNORMAL HIGH (ref 8–23)
CO2: 21 mmol/L — ABNORMAL LOW (ref 22–32)
Calcium: 8.1 mg/dL — ABNORMAL LOW (ref 8.9–10.3)
Chloride: 94 mmol/L — ABNORMAL LOW (ref 98–111)
Creatinine, Ser: 1.12 mg/dL (ref 0.61–1.24)
GFR, Estimated: >60 mL/min (ref >60)
Glucose, Bld: 86 mg/dL (ref 70–99)
Potassium: 3.9 mmol/L (ref 3.5–5.1)
Sodium: 127 mmol/L — ABNORMAL LOW (ref 135–145)
Total Bilirubin: 1.7 mg/dL — ABNORMAL HIGH (ref 0.0–1.2)
Total Protein: 5.4 g/dL — ABNORMAL LOW (ref 6.5–8.1)

## 2023-11-16 LAB — CBC
HCT: 37.5 % — ABNORMAL LOW (ref 39.0–52.0)
Hemoglobin: 12.8 g/dL — ABNORMAL LOW (ref 13.0–17.0)
MCH: 30.3 pg (ref 26.0–34.0)
MCHC: 34.1 g/dL (ref 30.0–36.0)
MCV: 88.7 fL (ref 80.0–100.0)
Platelets: 79 K/uL — ABNORMAL LOW (ref 150–400)
RBC: 4.23 MIL/uL (ref 4.22–5.81)
RDW: 17.2 % — ABNORMAL HIGH (ref 11.5–15.5)
WBC: 14.4 K/uL — ABNORMAL HIGH (ref 4.0–10.5)
nRBC: 0 % (ref 0.0–0.2)

## 2023-11-16 LAB — GLUCOSE, CAPILLARY: Glucose-Capillary: 90 mg/dL (ref 70–99)

## 2023-11-16 LAB — MAGNESIUM: Magnesium: 2.6 mg/dL — ABNORMAL HIGH (ref 1.7–2.4)

## 2023-11-16 MED ORDER — ONDANSETRON HCL 4 MG/2ML IJ SOLN: 4.0000 mg | Freq: Four times a day (QID) | INTRAMUSCULAR | Status: DC | PRN

## 2023-11-16 MED ORDER — AMOXICILLIN-POT CLAVULANATE 875-125 MG PO TABS: 1.0000 | ORAL_TABLET | Freq: Two times a day (BID) | ORAL | Status: DC

## 2023-11-16 MED ORDER — POLYETHYLENE GLYCOL 3350 17 G PO PACK: 17.0000 g | PACK | Freq: Every day | ORAL | Status: DC | PRN

## 2023-11-16 MED ORDER — PANTOPRAZOLE SODIUM 40 MG IV SOLR
40.0000 mg | INTRAVENOUS | Status: DC
  Administered 2023-11-16: 40 mg via INTRAVENOUS
  Filled 2023-11-16: qty 10

## 2023-11-16 MED ORDER — FENTANYL CITRATE (PF) 50 MCG/ML IJ SOSY
12.5000 ug | PREFILLED_SYRINGE | INTRAMUSCULAR | Status: DC | PRN
Start: 2023-11-16 — End: 2023-11-16
  Administered 2023-11-16 (×2): 12.5 ug via INTRAVENOUS
  Filled 2023-11-16 (×2): qty 1

## 2023-11-16 MED ORDER — ONDANSETRON HCL 4 MG PO TABS: 4.0000 mg | ORAL_TABLET | Freq: Four times a day (QID) | ORAL | Status: DC | PRN

## 2023-11-16 MED ORDER — SODIUM CHLORIDE 0.9 % IV SOLN
12.5000 mg | Freq: Four times a day (QID) | INTRAVENOUS | Status: DC | PRN
  Administered 2023-11-16: 12.5 mg via INTRAVENOUS
  Filled 2023-11-16: qty 12.5

## 2023-11-16 MED ORDER — RISPERIDONE 0.5 MG PO TABS: 0.5000 mg | ORAL_TABLET | Freq: Every evening | ORAL | Status: DC | PRN

## 2023-11-16 MED ORDER — ACETAMINOPHEN 325 MG PO TABS: 650.0000 mg | ORAL_TABLET | Freq: Four times a day (QID) | ORAL | Status: DC | PRN

## 2023-11-16 MED ORDER — PANTOPRAZOLE SODIUM 40 MG IV SOLR: 40.0000 mg | INTRAVENOUS | Status: DC

## 2023-11-16 MED ORDER — ACETAMINOPHEN 650 MG RE SUPP: 650.0000 mg | Freq: Four times a day (QID) | RECTAL | Status: DC | PRN

## 2023-11-16 MED ORDER — ENSURE PLUS HIGH PROTEIN PO LIQD: 237.0000 mL | Freq: Three times a day (TID) | ORAL | Status: DC

## 2023-11-16 MED ORDER — FENTANYL CITRATE (PF) 50 MCG/ML IJ SOSY: 12.5000 ug | PREFILLED_SYRINGE | INTRAMUSCULAR | Status: DC | PRN

## 2023-11-16 MED ORDER — SODIUM CHLORIDE 0.9 % IV SOLN: 12.5000 mg | Freq: Four times a day (QID) | INTRAVENOUS | Status: DC | PRN

## 2023-11-16 NOTE — Progress Notes (Incomplete)
 {  Select_TRH_Note:[id number]}

## 2023-11-16 NOTE — Progress Notes (Signed)
 Daily Progress Note   Patient Name: Francisco [name]       Date: 11/16/2023 DOB: 05-04-1933  Age: 89 y.o. MRN#: [id number] Attending Physician: Fausto Burnard LABOR, DO Primary Care Physician: Shepard Ade, MD Admit Date: 11/12/2023  Reason for Consultation/Follow-up: Establishing goals of care  Subjective:  Continues with generalized weakness,    low BP    less alert. One episode of vomiting earlier this am.   Length of Stay: 3  Current Medications: Scheduled Meds:   amoxicillin-clavulanate  1 tablet Oral Q12H   feeding supplement  237 mL Oral TID BM   furosemide   20 mg Intravenous BID   midodrine  5 mg Oral TID WC   risperiDONE  0.5 mg Oral BID   sodium chloride  flush  3 mL Intravenous Q12H    Continuous Infusions:  albumin  human 12.5 g (11/16/23 0814)    PRN Meds: acetaminophen  **OR** acetaminophen , fentaNYL  (SUBLIMAZE ) injection, ondansetron  **OR** ondansetron  (ZOFRAN ) IV, risperiDONE, sodium chloride  flush  Physical Exam         Elderly gentleman chronically ill-appearing Mild acute distress Regular work of breathing Appears frail Has peripheral edema bilateral lower extremities  Vital Signs: BP 97/62 (BP Location: Right Arm)   Pulse 96   Temp 98 F (36.7 C)   Resp 20   Ht 5' 8 (1.727 m)   Wt 68.8 kg   SpO2 100%   BMI 23.06 kg/m  SpO2: SpO2: 100 % O2 Device: O2 Device: Room Air O2 Flow Rate:    Intake/output summary:  Intake/Output Summary (Last 24 hours) at 11/16/2023 1114 Last data filed at 11/16/2023 0900 Gross per 24 hour  Intake 302.68 ml  Output --  Net 302.68 ml   LBM: Last BM Date : 11/16/23 Baseline Weight: Weight: 70.5 kg Most recent weight: Weight: 68.8 kg       Palliative Assessment/Data:      Patient Active Problem List    Diagnosis Date Noted   Hyponatremia 11/15/2023   Anasarca 11/12/2023   Dementia with behavioral disturbance (HCC) 11/12/2023   Asthma 11/12/2023   Protein-calorie malnutrition, severe 08/09/2023   CAP (community acquired pneumonia) 08/05/2023   Hypokalemia 08/05/2023   Anemia 08/05/2023   Debility 08/05/2023   Hypoalbuminemia 08/05/2023   BPH with obstruction/lower urinary tract symptoms 06/15/2022   Abrasion of unspecified hand,  initial encounter 10/17/2020   ARF (acute renal failure) 08/12/2020   Nausea vomiting and diarrhea 08/12/2020   AKI (acute kidney injury) 08/12/2020   Sensorineural hearing loss (SNHL) of both ears 03/22/2020   Diabetes mellitus type 2, uncomplicated (HCC) 04/28/2018   Elevated carcinoembryonic antigen (CEA) 04/08/2017   History of urinary stone 04/08/2017   Internal Gr 2 and Gr 1 external bleeding hemorrhoids 05/23/2016   Hx of adenomatous colonic polyps 02/14/2016   Fatty liver 11/02/2015   Disorder of soft tissue 07/13/2015   Gastro-esophageal reflux disease without esophagitis 03/07/2015   Non-thrombocytopenic purpura 10/27/2014   Encounter for general adult medical examination without abnormal findings 10/20/2014   Benign prostatic hyperplasia 11/26/2011   Hyperlipidemia 09/28/2011   Renal calculi 09/19/2011    Palliative Care Assessment & Plan   Patient Profile:    Assessment:  89 year old gentleman who lives at home with his wife, past medical history of asthma BPH diabetes GERD dyslipidemia Admitted from urology office with volume overload Patient has extensive history of chronic lower extremity edema and has been prescribed Lasix  however because of ongoing functional decline, the patient has been noncompliant with the Lasix  medication regimen and because it made him excessively urinate. Mild orthopnea and paroxysmal nocturnal dyspnea as well Echocardiogram done.  Of note patient had a fall in June.  He had a pneumonia.  He had a prior  hospitalization in the past few months.  He also went to [facility] facility for rehab for short amount of time.  Recommendations/Plan: Goals of care discussions held alongside [facility] colleague Dr Fausto, also with patient's wife, daughter grand daughter and great grand daughter present, we reviewed his current condition and talked about his underlying serious illnesses and his sub acute functional and cognitive status decline. Goals of care discussions undertaken again -  DNR DNI comfort measures Seek residential hospice evaluation IV Fentanyl  PRN pain/shortness of breath D/C Tramadol  D/C medications not contributing to the goal of comfort care.  Prognosis appears to be few days, in my opinion.  TOC and hospice liaisons consult.       Code Status:    Code Status Orders  (From admission, onward)           Start     Ordered   11/13/23 1509  Do not attempt resuscitation (DNR)- Limited -Do Not Intubate (DNI)  (Code Status)  Continuous       Question Answer Comment  If pulseless and not breathing No CPR or chest compressions.   In Pre-Arrest Conditions (Patient Is Breathing and Has A Pulse) Do not intubate. Provide all appropriate non-invasive medical interventions. Avoid ICU transfer unless indicated or required.   Consent: Discussion documented in EHR or advanced directives reviewed      11/13/23 1508           Code Status History     Date Active Date Inactive Code Status Order ID Comments User Context   11/12/2023 1543 11/13/2023 1508 Full Code [id number]  Eldonna Elspeth PARAS, MD ED   08/09/2023 1145 08/10/2023 2153 Limited: Do not attempt resuscitation (DNR) -DNR-LIMITED -Do Not Intubate/DNI  [id number]  Jillian Buttery, MD Inpatient   08/05/2023 1942 08/09/2023 1145 Full Code [id number]  Silvester Ales, MD ED   06/15/2022 1420 06/17/2022 1817 Full Code [id number]  Cory Alan RIGGERS Inpatient   08/12/2020 0144 08/15/2020 1604 Full Code [id number]  Franky Redia SAILOR, MD  ED      Advance Directive Documentation    Flowsheet Row Most Recent  Value  Type of Advance Directive Healthcare Power of Attorney  Pre-existing out of facility DNR order (yellow form or pink MOST form) --  MOST Form in Place? --    Prognosis: Few days  Discharge Planning: Residential hospice evaluation.   Care plan was discussed with  RN, patient, [facility], wife, daughter  grand daughter [name]  great grand daughter [facility] and hospice liaison  Thank you for allowing the Palliative Medicine Team to assist in the care of this patient.  High MDM.      Greater than 50%  of this time was spent counseling and coordinating care related to the above assessment and plan.  Lonia Serve, MD  Please contact Palliative Medicine Team phone at [phone] for questions and concerns.

## 2023-11-16 NOTE — Discharge Summary (Signed)
 Physician Discharge Summary   Patient: Francisco [name] MRN: [id number] DOB: Dec 04, 1933  Admit date:     11/12/2023  Discharge date: 11/16/23  Discharge Physician: Burnard DELENA Cunning   PCP: Shepard Ade, MD   Recommendations at discharge:    Follow up with Hospice team upon arrival for ongoing comfort care  Discharge Diagnoses: Principal Problem:   Anasarca Active Problems:   Benign prostatic hyperplasia   Diabetes mellitus type 2, uncomplicated (HCC)   Gastro-esophageal reflux disease without esophagitis   Hypokalemia   Debility   Dementia with behavioral disturbance (HCC)   Asthma   Hyponatremia   Hospice care patient  Resolved Problems:   * No resolved hospital problems. *  Hospital Course:   Francisco [name] is a 89 y.o. male with medical history significant of asthma, BPH, t2DM, GERD, HLD presenting with volume overload. Family reports patient went to urology appointment today when patient was noted to have significant 3+ pitting edema with intermittent SOB by urologist. Pt was subsequently sent to the ER for evaluation. Family reports patient w/ an extended history of chronic LE edema. Pt was prescribed lasix . However, he has been relatively non compliant with medication regimen. Pt with noted major fall with  left upper extremity fracture/right patellar fracture 06/2023. Family states that he stopped taking the medication because medication made him excessively urinate. MIld orthopnea and PND. Family/pt report no know history or diagnosis of CHF in the past. Pt has been taking intermittent doses of NSAIDs for pain. Also admits to high salt diet. No CP or SOB. No nausea or vomiting.  Presented to the ER afebrile, hemodynamically stable. Satting well on RA. CBC pending. Cr 1.02, glucose 126, K 3.4.  Pro BNP 217. CXR with bibasilar atelectasis and small R pleural effusion.      Patient admitted for IV diuresis and further evaluation and management as outlined in detail  below.  Despite attempts to diurese, including with IV albumin  to augment and midodrine for BP support, patient's volume status did not improve.  He has persistent severe pitting edema to trunk and pelvis causing significant discomfort.    Palliative care was consulted and extensive goals of care discussions were had with myself, Palliative MD and family members over the past few days.  Pt has continued to clinically decline and showing signs of nearing end of life.  Family have decided on comfort measures starting today and patient has been accepted for hospice care at [facility].  He is  currently medically stable for discharge and transport.   Assessment and Plan:  Comfort Care Measures / Hospice Transfer to [facility] for ongoing comfort care measures.      * Anasarca Progressive worsening lower extremity and abdominal swelling over multiple weeks with baseline history of chronic fluid retention without formal diagnosis of heart failure in addition to medical noncompliance to home lasix   Persistent 3+ pitting edema in LE bilaterally w/ edema extending into the abdomen & scrotum No improvement in edema despite diuresis + albumin  and midodrine. CXR w/ small R pleural effusion, no pulmonary edema Hypoalbuminemia Albumin  1.8 --  Severe third-spacing edema Echocardiogram was done yesterday (11/12), however images were not adequate to visualize the heart well. RUQ U/S did confirm changes consistent with cirrhosis.   11/13--not much change in net fluid balance or clinically on exam edema is minimally improved, renal function stable 11/14 -- wt finally trended down. No outputs charted, pt incontinent.  Very soft BP's.  Edema appears unchanged. 11/15 --  transitioning to comfort care.  Stop diuresis, lasix  and midodrine   Hyponatremia Na trended down to 128 >> 127 --No further labs   Asthma Stable from a resp standpoint. No hypoxia or wheezing --PRN bronchodilator    Dementia with  behavioral disturbance (HCC) Family reports worsening agitation at home w/ memory changes and confusion.  Had prior ? Eval for dementia with diagnosis- pending neuro outpatient re-assessment  --Continue home risperdal 0.5 mg BID  --Added extra night time dose in case of secondary delirium  --Had upcoming Neurology appointment for formal assessment    Debility Progressive worsening functional status at home s/p left upper extremity fracture/right patellar fracture 06/2023  Pt minimally ambulating at home  --Fall precautions   Hypokalemia --No further labs    Gastro-esophageal reflux disease without esophagitis --Continue IV PPI    Diabetes mellitus type 2, uncomplicated (HCC) --Stop CBG's    Benign prostatic hyperplasia On finasteride           Consultants: Palliative Care Procedures performed: as above  Disposition: Hospice care Diet recommendation:  Discharge Diet Orders (From admission, onward)     Start     Ordered   11/16/23 0000  Diet - per patient pleasure   11/16/23 1423            DISCHARGE MEDICATION: Allergies as of 11/16/2023       Reactions   Metformin Hcl Other (See Comments)   GI upset        Medication List     STOP taking these medications    Accu-Chek Guide test strip Generic drug: glucose blood   Accu-Chek Guide Test test strip Generic drug: glucose blood   Align 4 MG Caps   aspirin  EC 81 MG tablet   Creon  3000-9500 units Cpep Generic drug: Pancrelipase  (Lip-Prot-Amyl)   docusate sodium  100 MG capsule Commonly known as: COLACE   esomeprazole 40 MG capsule Commonly known as: NEXIUM   ferrous sulfate  325 (65 FE) MG tablet   furosemide  20 MG tablet Commonly known as: LASIX    HYDROcodone -acetaminophen  5-325 MG tablet Commonly known as: NORCO/VICODIN   Jardiance  10 MG Tabs tablet Generic drug: empagliflozin    Klor-Con  M20 20 MEQ tablet Generic drug: potassium chloride  SA   levothyroxine 50 MCG tablet Commonly  known as: SYNTHROID   loperamide  2 MG tablet Commonly known as: IMODIUM  A-D   LORazepam 0.5 MG tablet Commonly known as: ATIVAN   multivitamin with minerals Tabs tablet   thiamine  100 MG tablet Commonly known as: Vitamin B-1   triamcinolone  cream 0.1 % Commonly known as: KENALOG        TAKE these medications    acetaminophen  325 MG tablet Commonly known as: TYLENOL  Take 2 tablets (650 mg total) by mouth every 6 (six) hours as needed for mild pain (pain score 1-3), fever, moderate pain (pain score 4-6) or headache (or Fever >/= 101).   acetaminophen  650 MG suppository Commonly known as: TYLENOL  Place 1 suppository (650 mg total) rectally every 6 (six) hours as needed for mild pain (pain score 1-3) or fever (or Fever >/= 101).   feeding supplement Liqd Take 237 mLs by mouth 3 (three) times daily between meals.   fentaNYL  50 MCG/ML Sosy injection Commonly known as: SUBLIMAZE  Inject 0.25 mLs (12.5 mcg total) into the vein every 2 (two) hours as needed for severe pain (pain score 7-10) or moderate pain (pain score 4-6).   finasteride  5 MG tablet Commonly known as: PROSCAR  Take 1 tablet (5 mg total) by mouth  daily.   ondansetron  4 MG tablet Commonly known as: ZOFRAN  Take 1 tablet (4 mg total) by mouth every 6 (six) hours as needed for nausea.   ondansetron  4 MG/2ML Soln injection Commonly known as: ZOFRAN  Inject 2 mLs (4 mg total) into the vein every 6 (six) hours as needed for nausea.   pantoprazole  40 MG injection Commonly known as: PROTONIX  Inject 40 mg into the vein daily. Start taking on: November 17, 2023   polyethylene glycol 17 g packet Commonly known as: MIRALAX  / GLYCOLAX  Take 17 g by mouth daily as needed. What changed:  when to take this reasons to take this   risperiDONE 0.5 MG tablet Commonly known as: RISPERDAL Take 0.5 mg by mouth 2 (two) times daily. What changed: Another medication with the same name was added. Make sure you understand how and  when to take each.   risperiDONE 0.5 MG tablet Commonly known as: RISPERDAL Take 1 tablet (0.5 mg total) by mouth at bedtime as needed (agitation). What changed: You were already taking a medication with the same name, and this prescription was added. Make sure you understand how and when to take each.   sodium chloride  0.9 % SOLN 50 mL with promethazine  25 MG/ML SOLN 12.5 mg Inject 12.5 mg into the vein every 6 (six) hours as needed.        Discharge Exam: Filed Weights   11/13/23 0507 11/15/23 0500 11/16/23 0543  Weight: 69 kg 67.9 kg 68.8 kg   General exam: awake but not responsive or interactive today, no acute distress Respiratory system: on room air, normal respiratory effort. Cardiovascular system: persistent 3+ pitting edema of BLE's to trunk & abdomen Gastrointestinal system: soft, non-distended Central nervous system: limited exam due to lack of interaction from pt Skin: dry, intact, normal temperature, ecchymosis of UE's Psychiatry: awake (eyes open) but not interactive   Condition at discharge: stable  The results of significant diagnostics from this hospitalization (including imaging, microbiology, ancillary and laboratory) are listed below for reference.   Imaging Studies: US  Abdomen Limited RUQ (LIVER/GB) Result Date: 11/15/2023 CLINICAL DATA:  Transaminitis EXAM: ULTRASOUND ABDOMEN LIMITED RIGHT UPPER QUADRANT COMPARISON:  CT 05/31/2022 FINDINGS: Gallbladder: History of cholecystectomy Common bile duct: Diameter: Unable to measure, poorly visualized. Liver: Limited assessment due to poor imaging window. Heterogenous echogenic liver parenchyma. Suspicion of lobulated liver contour. Portal vein could not be adequately visualized. Other: Ascites in the right upper quadrant. IMPRESSION: 1. Limited exam due to poor imaging window, habitus and patient cooperation. 2. Heterogenous echogenic liver parenchyma with suspicion of lobulated liver contour, possible cirrhosis.  Unable to visualize portal vein or common bile duct. 3. Ascites in the right upper quadrant. Electronically Signed   By: Luke Bun M.D.   On: 11/15/2023 21:09   DG CHEST PORT 1 VIEW Result Date: 11/15/2023 CLINICAL DATA:  Cough. EXAM: PORTABLE CHEST 1 VIEW COMPARISON:  11/12/2023.  Left humerus dated 08/09/2023. FINDINGS: Poor inspiration. Borderline enlarged cardiac silhouette. Clear lungs with normal vascularity. Mild peribronchial thickening. Diffuse osteopenia. Partially included previously demonstrated left humerus fracture. IMPRESSION: 1. Mild bronchitic changes. 2. Borderline cardiomegaly. Electronically Signed   By: Elspeth Bathe M.D.   On: 11/15/2023 18:19   ECHOCARDIOGRAM COMPLETE Result Date: 11/13/2023    ECHOCARDIOGRAM REPORT   Patient Name:   Shneur E Laffoon Date of Exam: 11/13/2023 Medical Rec #:  [id number]       Height:       68.0 in Accession #:    [id number]  Weight:       152.1 lb Date of Birth:  April 16, 1933       BSA:          1.819 m Patient Age:    89 years        BP:           96/63 mmHg Patient Gender: M               HR:           106 bpm. Exam Location:  Inpatient Procedure: 2D Echo, Cardiac Doppler and Color Doppler (Both Spectral and Color            Flow Doppler were utilized during procedure). Indications:    I50.40* Unspecified combined systolic (congestive) and diastolic                 (congestive) heart failure  History:        Patient has no prior history of Echocardiogram examinations.                 Signs/Symptoms:Alzheimer's; Risk Factors:Dyslipidemia and                 Diabetes.  Sonographer:    Ellouise Mose RDCS Referring Phys: [phone] STEVEN J NEWTON  Sonographer Comments: Technically challenging study due to limited acoustic windows, Technically difficult study due to poor echo windows, suboptimal apical window, suboptimal subcostal window and no parasternal window. Non- diagnostic quality images. Attempted to turn patient. Patient had pain when attempting  subcostal images. IMPRESSIONS  1. Left ventricular endocardial border not optimally defined to evaluate regional wall motion. Left ventricular diastolic function could not be evaluated.  2. Right ventricular systolic function was not well visualized. The right ventricular size is not well visualized.  3. Large pleural effusion.  4. The mitral valve was not well visualized.  5. The aortic valve was not well visualized. Comparison(s): No prior Echocardiogram. Essentially non diagnostic echo due to poor windows and patient discomfort. LV function appears relatively preserved in the one view it is seen. FINDINGS  Left Ventricle: Left ventricular endocardial border not optimally defined to evaluate regional wall motion. Suboptimal image quality limits for assessment of left ventricular hypertrophy. Left ventricular diastolic function could not be evaluated. Right Ventricle: The right ventricular size is not well visualized. Right vetricular wall thickness was not well visualized. Right ventricular systolic function was not well visualized. Left Atrium: Left atrial size was not well visualized. Right Atrium: Right atrial size was not well visualized. Pericardium: The pericardium was not well visualized. Mitral Valve: The mitral valve was not well visualized. Tricuspid Valve: The tricuspid valve is not well visualized. Aortic Valve: The aortic valve was not well visualized. Pulmonic Valve: The pulmonic valve was not well visualized. Aorta: The aortic root is normal in size and structure and the ascending aorta was not well visualized. IAS/Shunts: The interatrial septum was not well visualized. Additional Comments: There is a large pleural effusion.  LEFT VENTRICLE PLAX 2D LVIDd:         4.40 cm LVIDs:         3.20 cm LV PW:         1.40 cm LV IVS:        1.10 cm LVOT diam:     2.40 cm LVOT Area:     4.52 cm  LEFT ATRIUM         Index LA diam:    3.40 cm 1.87 cm/m  AORTA Ao Root diam: 3.80 cm Ao Asc diam:  4.10 cm   SHUNTS Systemic Diam: 2.40 cm Morene Brownie Electronically signed by Morene Brownie Signature Date/Time: 11/13/2023/11:26:47 AM    Final    DG Chest 2 View Result Date: 11/12/2023 CLINICAL DATA:  Volume overload.  Lower extremity swelling. EXAM: CHEST - 2 VIEW COMPARISON:  08/05/2023 FINDINGS: Decreased lung volumes are seen since prior exam. Mild bibasilar atelectasis noted. Probable small right pleural effusion. No evidence of pulmonary edema or consolidation. IMPRESSION: Decreased lung volumes, with mild bibasilar atelectasis and probable small right pleural effusion. Electronically Signed   By: Norleen DELENA Kil M.D.   On: 11/12/2023 12:21    Microbiology: Results for orders placed or performed during the hospital encounter of 11/12/23  Resp panel by RT-PCR (RSV, Flu A&B, Covid) Anterior Nasal Swab     Status: None   Collection Time: 11/12/23 12:33 PM   Specimen: Anterior Nasal Swab  Result Value Ref Range Status   SARS Coronavirus 2 by RT PCR NEGATIVE NEGATIVE Final    Comment: (NOTE) SARS-CoV-2 target nucleic acids are NOT DETECTED.  The SARS-CoV-2 RNA is generally detectable in upper respiratory specimens during the acute phase of infection. The lowest concentration of SARS-CoV-2 viral copies this assay can detect is 138 copies/mL. A negative result does not preclude SARS-Cov-2 infection and should not be used as the sole basis for treatment or other patient management decisions. A negative result may occur with  improper specimen collection/handling, submission of specimen other than nasopharyngeal swab, presence of viral mutation(s) within the areas targeted by this assay, and inadequate number of viral copies(<138 copies/mL). A negative result must be combined with clinical observations, patient history, and epidemiological information. The expected result is Negative.  Fact Sheet for Patients:  bloggercourse.com  Fact Sheet for Healthcare Providers:   seriousbroker.it  This test is no t yet approved or cleared by the [place]  FDA and  has been authorized for detection and/or diagnosis of SARS-CoV-2 by FDA under an Emergency Use Authorization (EUA). This EUA will remain  in effect (meaning this test can be used) for the duration of the COVID-19 declaration under Section 564(b)(1) of the Act, 21 U.S.C.section 360bbb-3(b)(1), unless the authorization is terminated  or revoked sooner.       Influenza A by PCR NEGATIVE NEGATIVE Final   Influenza B by PCR NEGATIVE NEGATIVE Final    Comment: (NOTE) The Xpert Xpress SARS-CoV-2/FLU/RSV plus assay is intended as an aid in the diagnosis of influenza from Nasopharyngeal swab specimens and should not be used as a sole basis for treatment. Nasal washings and aspirates are unacceptable for Xpert Xpress SARS-CoV-2/FLU/RSV testing.  Fact Sheet for Patients: bloggercourse.com  Fact Sheet for Healthcare Providers: seriousbroker.it  This test is not yet approved or cleared by the [place]  FDA and has been authorized for detection and/or diagnosis of SARS-CoV-2 by FDA under an Emergency Use Authorization (EUA). This EUA will remain in effect (meaning this test can be used) for the duration of the COVID-19 declaration under Section 564(b)(1) of the Act, 21 U.S.C. section 360bbb-3(b)(1), unless the authorization is terminated or revoked.     Resp Syncytial Virus by PCR NEGATIVE NEGATIVE Final    Comment: (NOTE) Fact Sheet for Patients: bloggercourse.com  Fact Sheet for Healthcare Providers: seriousbroker.it  This test is not yet approved or cleared by the [place]  FDA and has been authorized for detection and/or diagnosis of SARS-CoV-2 by FDA under an Emergency Use Authorization (EUA). This EUA  will remain in effect (meaning this test can be used) for  the duration of the COVID-19 declaration under Section 564(b)(1) of the Act, 21 U.S.C. section 360bbb-3(b)(1), unless the authorization is terminated or revoked.  Performed at [facility], [address] [place], [place] [address]     Labs: CBC: Recent Labs  Lab 11/12/23 1653 11/13/23 0746 11/14/23 1044 11/16/23 0711  WBC 5.9 12.7* 8.0 14.4*  HGB 12.6* 11.4* 12.1* 12.8*  HCT 39.9 33.5* 36.4* 37.5*  MCV 96.1 90.1 90.3 88.7  PLT 51* 232 191 79*   Basic Metabolic Panel: Recent Labs  Lab 11/12/23 1420 11/12/23 1653 11/13/23 0630 11/14/23 1044 11/15/23 0707 11/16/23 0711  NA 135  --  132* 128* 128* 127*  K 3.4*  --  3.7 3.8 3.6 3.9  CL 104  --  103 98 97* 94*  CO2 25  --  21* 22 24 21*  GLUCOSE 126*  --  90 111* 84 86  BUN 19  --  21 25* 25* 26*  CREATININE 1.02  --  1.03 1.16 1.09 1.12  CALCIUM  7.3*  --  7.4* 7.5* 7.2* 8.1*  MG  --  1.8  --   --  1.5* 2.6*  PHOS  --   --   --   --  2.6  --    Liver Function Tests: Recent Labs  Lab 11/12/23 1653 11/13/23 0630 11/14/23 1044 11/15/23 0707 11/16/23 0711  AST 47* 78* 83* 66* 88*  ALT 28 37 46* 39 47*  ALKPHOS 219* 215* 241* 187* 311*  BILITOT 1.0 1.0 0.8 0.7 1.7*  PROT 4.7* 4.3* 4.8* 4.1* 5.4*  ALBUMIN  1.8* 1.8* 1.7* 1.8* 2.2*   CBG: Recent Labs  Lab 11/14/23 2149 11/15/23 0830 11/15/23 1630 11/15/23 2153 11/16/23 0743  GLUCAP 125* 92 132* 108* 90    Discharge time spent: greater than 30 minutes.  Signed: Burnard DELENA Cunning, DO [facility] 11/16/2023

## 2023-11-16 NOTE — Progress Notes (Signed)
 Kinston E Barringer to be discharged to [facility] per MD order. Report called to Earnie, CHARITY FUNDRAISER at [facility]. Granddaughter [place] notified. Skin clean and dry with scattered bruising. IV catheter left in place per hospice. An After Visit Summary and signed DNR form was printed and given to PTAR. Patient escorted via stretcher, and discharged to [facility]. Francisco [name]  11/16/2023

## 2023-11-16 NOTE — TOC Transition Note (Signed)
 Transition of Care [facility]) - Discharge Note   Patient Details  Name: RUAIRI STUTSMAN MRN: [id number] Date of Birth: 12/25/1933  [facility] CM/SW Contact:  Sonda Manuella Quill, RN Phone Number: 11/16/2023, 3:04 PM   Clinical Narrative:    D/C orders received; notified by Nat Babe, [facility] Liaison, pt d/c to [facility]; room # will be given on arrival to facility call report # [phone]; transport by PTAR; spoke w/ pt's dtr Erminio Molt ([phone]); she agreed to d/c plan; PTAR called for transport at 1506; spoke w/ operator # [phone]; she was notified this is [facility] pt, and verbalized understanding; no IP CM needs.  Final next level of care: Hospice Medical Facility Barriers to Discharge: No Barriers Identified   Patient Goals and CMS Choice            Discharge Placement                Patient to be transferred to facility by: PTAR Name of family member notified: Erminio Molt (dtr) [phone] Patient and family notified of of transfer: 11/16/23  Discharge Plan and Services Additional resources added to the After Visit Summary for                  DME Arranged: N/A DME Agency: NA       HH Arranged: NA HH Agency: NA        Social Drivers of Health (SDOH) Interventions SDOH Screenings   Food Insecurity: No Food Insecurity (11/12/2023)  Housing: Low Risk  (11/12/2023)  Transportation Needs: No Transportation Needs (11/12/2023)  Utilities: Not At Risk (11/12/2023)  Social Connections: Socially Isolated (11/12/2023)  Tobacco Use: Medium Risk (11/12/2023)     Readmission Risk Interventions     No data to display

## 2023-11-16 NOTE — Progress Notes (Signed)
 [name] Room [address] [facility] Liaison Note  Referral received from [facility] for family interest in [facility].   Met with patient and family in room to explain services and hospice philosophy and all questions answered.   [facility] is able to accept patient this afternoon once consents are complete.   RN staff, you may call report at any time to [facility] @ [phone], room is assigned when report is called.   Please leave IV intact and send completed DNR with patient.   Updated attending and ICM and care via Epic chat.  Thank you for the opportunity to participate in this patient's care   Nat Babe, BSN, RN Hospice Nurse Liaison [phone]

## 2023-11-19 ENCOUNTER — Other Ambulatory Visit (HOSPITAL_COMMUNITY): Payer: Self-pay

## 2023-11-21 ENCOUNTER — Telehealth: Payer: Self-pay | Admitting: Podiatry

## 2023-11-21 NOTE — Telephone Encounter (Signed)
 Called to inform you that the patient passed away.

## 2023-12-02 DEATH — deceased
